# Patient Record
Sex: Female | Born: 1945 | Race: White | Hispanic: No | Smoking: Former smoker
Health system: Southern US, Community
[De-identification: ages and names within clinical notes are randomized; demographics above are authoritative.]

## PROBLEM LIST (undated history)

## (undated) DIAGNOSIS — M1990 Unspecified osteoarthritis, unspecified site: Secondary | ICD-10-CM

## (undated) DIAGNOSIS — E669 Obesity, unspecified: Secondary | ICD-10-CM

## (undated) DIAGNOSIS — I10 Essential (primary) hypertension: Secondary | ICD-10-CM

## (undated) DIAGNOSIS — I2510 Atherosclerotic heart disease of native coronary artery without angina pectoris: Secondary | ICD-10-CM

## (undated) DIAGNOSIS — E119 Type 2 diabetes mellitus without complications: Secondary | ICD-10-CM

## (undated) HISTORY — DX: Unspecified osteoarthritis, unspecified site: M19.90

## (undated) HISTORY — DX: Essential (primary) hypertension: I10

## (undated) HISTORY — PX: TUBAL LIGATION: SHX77

## (undated) HISTORY — DX: Obesity, unspecified: E66.9

## (undated) HISTORY — DX: Atherosclerotic heart disease of native coronary artery without angina pectoris: I25.10

## (undated) HISTORY — DX: Type 2 diabetes mellitus without complications: E11.9

## (undated) HISTORY — PX: REFRACTIVE SURGERY: SHX103

## (undated) HISTORY — PX: CHOLECYSTECTOMY: SHX55

## (undated) HISTORY — PX: CORONARY STENT PLACEMENT: SHX1402

---

## 1999-06-11 ENCOUNTER — Other Ambulatory Visit: Admission: RE | Admit: 1999-06-11 | Discharge: 1999-06-11 | Payer: Self-pay | Admitting: Family Medicine

## 1999-07-30 ENCOUNTER — Ambulatory Visit (HOSPITAL_COMMUNITY): Admission: RE | Admit: 1999-07-30 | Discharge: 1999-07-31 | Payer: Self-pay | Admitting: Ophthalmology

## 1999-07-30 ENCOUNTER — Encounter: Payer: Self-pay | Admitting: Ophthalmology

## 2000-07-23 ENCOUNTER — Other Ambulatory Visit: Admission: RE | Admit: 2000-07-23 | Discharge: 2000-07-23 | Payer: Self-pay | Admitting: Family Medicine

## 2000-08-10 ENCOUNTER — Ambulatory Visit (HOSPITAL_COMMUNITY): Admission: RE | Admit: 2000-08-10 | Discharge: 2000-08-11 | Payer: Self-pay | Admitting: Cardiovascular Disease

## 2001-08-05 ENCOUNTER — Other Ambulatory Visit: Admission: RE | Admit: 2001-08-05 | Discharge: 2001-08-05 | Payer: Self-pay | Admitting: Family Medicine

## 2004-10-30 ENCOUNTER — Ambulatory Visit (HOSPITAL_COMMUNITY): Admission: RE | Admit: 2004-10-30 | Discharge: 2004-10-30 | Payer: Self-pay | Admitting: Family Medicine

## 2006-02-24 ENCOUNTER — Ambulatory Visit (HOSPITAL_COMMUNITY): Admission: RE | Admit: 2006-02-24 | Discharge: 2006-02-24 | Payer: Self-pay | Admitting: Family Medicine

## 2009-04-13 ENCOUNTER — Emergency Department (HOSPITAL_COMMUNITY): Admission: EM | Admit: 2009-04-13 | Discharge: 2009-04-13 | Payer: Self-pay | Admitting: Emergency Medicine

## 2009-05-09 ENCOUNTER — Encounter: Admission: RE | Admit: 2009-05-09 | Discharge: 2009-05-09 | Payer: Self-pay | Admitting: Cardiology

## 2009-05-11 ENCOUNTER — Inpatient Hospital Stay (HOSPITAL_BASED_OUTPATIENT_CLINIC_OR_DEPARTMENT_OTHER): Admission: RE | Admit: 2009-05-11 | Discharge: 2009-05-11 | Payer: Self-pay | Admitting: Cardiology

## 2010-03-31 ENCOUNTER — Encounter: Payer: Self-pay | Admitting: Family Medicine

## 2010-05-29 LAB — DIFFERENTIAL
Basophils Absolute: 0 10*3/uL (ref 0.0–0.1)
Basophils Relative: 1 % (ref 0–1)
Eosinophils Absolute: 0.3 10*3/uL (ref 0.0–0.7)
Eosinophils Relative: 4 % (ref 0–5)
Lymphocytes Relative: 46 % (ref 12–46)
Lymphs Abs: 3.6 10*3/uL (ref 0.7–4.0)
Monocytes Absolute: 0.7 10*3/uL (ref 0.1–1.0)
Monocytes Relative: 9 % (ref 3–12)
Neutro Abs: 3.2 10*3/uL (ref 1.7–7.7)
Neutrophils Relative %: 41 % — ABNORMAL LOW (ref 43–77)

## 2010-05-29 LAB — POCT CARDIAC MARKERS
CKMB, poc: 1.9 ng/mL (ref 1.0–8.0)
Myoglobin, poc: 152 ng/mL (ref 12–200)
Troponin i, poc: <0.05 ng/mL (ref 0.00–0.09)

## 2010-05-29 LAB — CBC
HCT: 37.5 % (ref 36.0–46.0)
Hemoglobin: 12.9 g/dL (ref 12.0–15.0)
MCHC: 34.3 g/dL (ref 30.0–36.0)
MCV: 92.5 fL (ref 78.0–100.0)
Platelets: 296 10*3/uL (ref 150–400)
RBC: 4.06 MIL/uL (ref 3.87–5.11)
RDW: 14.1 % (ref 11.5–15.5)
WBC: 7.8 10*3/uL (ref 4.0–10.5)

## 2010-05-29 LAB — BASIC METABOLIC PANEL
BUN: 27 mg/dL — ABNORMAL HIGH (ref 6–23)
CO2: 25 mEq/L (ref 19–32)
Chloride: 104 mEq/L (ref 96–112)
Creatinine, Ser: 1.74 mg/dL — ABNORMAL HIGH (ref 0.4–1.2)
GFR calc Af Amer: 35 mL/min — ABNORMAL LOW (ref >60)
Potassium: 4.5 mEq/L (ref 3.5–5.1)

## 2010-05-29 LAB — BRAIN NATRIURETIC PEPTIDE: Pro B Natriuretic peptide (BNP): <30 pg/mL (ref 0.0–100.0)

## 2010-05-31 LAB — POCT I-STAT GLUCOSE
Glucose, Bld: 186 mg/dL — ABNORMAL HIGH (ref 70–99)
Operator id: 122531

## 2010-07-26 NOTE — H&P (Signed)
[place]. [facility]  Patient:    Paige Wilkins, Paige Wilkins                 MRN: [id number] Adm. Date:  [id number] Disc. Date: [id number] Attending:  Ivor Messier CC:         ____________             Marcelyn Bruins. Nile Riggs, M.D.                         History and Physical  This was a planned outpatient urgent surgical admission of this 65 year old white female admitted with a rhegmatogenous retinal detachment of the right eye.  HISTORY OF PRESENT ILLNESS:  This patient had previous cataract implant surgery of the right eye by Dr. Jethro Bolus approximately one year ago in July 2000.  The  patient did well until recently when she began to note the onset of circles in he right eye one to two weeks prior to admission.  She then began to note blurring of vision as if she was under water and a progressive shadow across her vision reducing her vision to light perception only.  She was seen by Dr. Nile Riggs who noted a retinal detachment and the patient was referred to my office.  This diagnosis was confirmed and arrangements made for her outpatient admission at this time.  PAST MEDICAL HISTORY:  Patient has non-insulin-dependent diabetes mellitus for approximately one year and hypertension.  She currently is taking various medications including Accupril, hydrochlorothiazide, Prempro, Glucophage, zinc _______ and Centrum.  She has had to take eye drop for intraocular pressure control twice a day.  Patient is felt to be at satisfactory risk for the proposed surgery under general anesthesia by Dr. ____________, her regular physician.  PHYSICAL EXAMINATION:  VITAL SIGNS:  As recorded on admission:  Blood pressure 159/94, temperature 97.1, heart rate 77, respiration 16.  GENERAL:  Patient is a pleasant, well-nourished, well-developed white female in  acute ocular distress.  HEENT:  Visual acuity as noted above.  External ocular and  slit-lamp examination: The eyes are white and clear with a posterior chamber intraocular lens implant.  The right eye posterior capsule is open.  The pupil is dilated in the right eye and left eye from office examination.  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  Small tiny horseshoe tear is noted at the 2-2:30 position.  There is a small retinal hemorrhage at the 6:30 position but no definite retinal hole.  CHEST:  Lungs are clear to percussion and auscultation.  HEART:  Normal sinus rhythm, no cardiomegaly, no murmurs.  ABDOMEN:  Negative.  EXTREMITIES:  Negative.  ADMISSION DIAGNOSES: 1. Rhegmatogenous retinal detachment, single defect right eye. 2. postbox both eyes. 3. ? glaucoma both eyes.  SURGICAL PLAN:  Scleral buckling procedure right eye with possible vitrectomy.  Patient has been given oral discussion and printed information along with her daughter about the possible complications, reoperations and failure to reattach the retina.  Patient was told that she had an extremely guarded prognosis due to the total detachment and tendency toward proliferative vitreoretinopathy. DD:  07/30/99 TD:  07/30/99 Job: [id number]

## 2010-07-26 NOTE — Op Note (Signed)
[place]. [facility]  Patient:    Paige Wilkins, Paige Wilkins                 MRN: [id number] Proc. Date: 07/30/99 Adm. Date:  [id number] Disc. Date: [id number] Attending:  Ivor Messier CC:         ____________             Marcelyn Bruins. Nile Riggs, M.D.                           Operative Report  PREOPERATIVE DIAGNOSIS:  Rhegmatogenous right eye, single defect.  POSTOPERATIVE DIAGNOSIS:  Rhegmatogenous right eye, single defect.  NAME OF OPERATION:  Scleral buckling procedure right eye using solid silicone implants #277 and #240 cryo application, diathermy application and external drainage of subretinal fluid.  Intravitreal and injection of sterile balanced salt solution in anterior chamber x 2.  SURGEON:  Guadelupe Sabin, M.D.  ASSISTANT:  Nurse.  ANESTHESIA:  General.  OPHTHALMOSCOPY:  As previously described.  OPERATIVE PROCEDURE:  After the patient was prepped and draped lid traction sutures were placed in the right upper and lower lids.  The peritomy was performed adjacent to the limbus 360 degrees.  The rectus muscles were isolated and looped with 4-0 silk traction sutures.  The sclera was infected and felt to be in satisfactory condition for the proposed lamellar scleral dissection.  Localization was then carried out with cryo application to the tiny retinal tear located at the 2-2:30 position.  Good cryo reaction could be seen surrounding the tear.  Once again the retinal hemorrhage was noted at the 6:30 position but no definite retinal break  seen.  It was therefore elected to perform lamellar scleral dissection from the 1-4 oclock position, the bed measuring 9 mm in width.  Light diathermy application were applied to the interscleral lamella.  A total of four 4-0 green [place]ilene sutures were used to close the scleral flaps over a trimmed #277 solid silicone  implant with 3-0 plain catgut reinforcements.  A #240 solid silicone  encircling  band was placed about the globe tied with a suture at the 7:30 position. Anchoring sutures were placed at the 8 and 10 oclock position of 5-0 Dacron.  After repeat indirect ophthalmoscopy, it was elected to drain fluid in the bed at the 3:30 position.  Incision was made through the interscleral lamellar, the choroid exposed with light diathermy and then perforated with the pin electrode.  An abundant amount of clear and slightly yellow tinged subretinal fluid drained.  The eye became quite hypotonus and it was necessary to inject into the anterior chamber on two occasions sterile balanced salt solution, a total of 2-1/2 cc were injected. The subretinal fluid continued to drain as the drain site was reopened at the 3:30 position.  Finally, the subretinal fluid stopped draining and the scleral buckle was out.  Indirect ophthalmoscopy revealed settling of the retinal tear on the implant surface.  There was minimal subretinal fluid left, and it was elected to close.  The scleral flaps were secured permanently and the tension of the encircling band stabilized.  Tenons capsule was pulled forward in the four quadrants and tied as a separate layer.  The tension tie was pulled forward and closed with a running 6-0 chromic catgut suture.  Depo, Garamycin and Celestone were injected in the subtenon space interiorly.  A light patch and protector shield were applied along with Maxitrol and Atropine  ointment.  Duration of procedure f anesthesia administration:  1-1/2 hours.  Patient tolerated the procedure well n general.  Left the operating room for the recovery room in good condition. DD:  07/30/99 TD:  07/30/99 Job: [id number]

## 2010-07-26 NOTE — Cardiovascular Report (Signed)
[place]. [facility]  Patient:    Paige Wilkins, Paige Wilkins                   MRN: [id number] Proc. Date: 08/10/00 Attending:  Alvia Grove., M.D. CC:         Cardiac catheter lab  Monica Becton, M.D.   Cardiac Catheterization  PROCEDURE:  Left heart catheterization with coronary angiography.  She had percutaneous transluminal coronary angioplasty and stenting of the right coronary artery.  INDICATIONS:  Paige Wilkins is a 65 year old female who is referred to the office after having a positive stress test.  She is referred for heart catheterization because of her multiple risk factors for coronary artery disease including diabetes mellitus, hypertension, hyperlipidemia, and a positive stress test.  The patient has had intermittent episodes of chest pain for the past several months.  DESCRIPTION OF PROCEDURE:  The right femoral artery was easily cannulated using a modified Seldinger technique.  RESULTS:  Hemodynamic data: 1. Left ventricular pressure was 148/16. 2. Aortic pressure was 148/105.  Angiographic data: 1. The left main coronary artery is relatively large and normal.  There is    a heavy degree of calcification in the left main and left anterior    descending artery.  The LAD has only minor luminal irregularities. 2. Left circumflex artery is a moderate size vessel.  There are minor luminal    irregularities.  There is a 20-30% stenosis in the proximal circumflex    artery. 3. Right coronary artery is a moderate size vessel and is dominant.  There is    a proximal 70% stenosis followed by a mid-95-99% stenosis.  The PDA    and the posterolateral segment artery are normal.  Left ventriculogram:  Left ventriculogram was performed in a RAO position.  It reveals overall normal left ventricular systolic function with an ejection fraction of 65-70%.  Angioplasty:  The patient was given 5200 units of heparin.  This was followed by  a double bolus Integrilin drip.  The right coronary artery was engaged using a 7 [place] Judkins right 4 catheter.  The right coronary artery was wired using a Patriot 014 angioplasty wire.  A 3.5 by 15 mm Quantum Monorail was passed down to the distal stenosis.  It was inflated up to 12 atmospheres before the plaque actually broke.  It was inflated up to 66.  This resulted in a markedly improved vessel lumen.  There was still a slight amount of haziness.  The balloon was then pulled back and was inflated in the proximal segment on two occasions.  Then 6 atmospheres for 45 seconds followed by 12 atmospheres for 45 seconds.  This resulted in some continued improvement.  At this point, it was decided to stent the distal most lesion.  A 3.5 by 15 mm Nier stent was positioned across the distal stenosis and was deployed at 12 atmospheres for 43 seconds.  This resulted in a nice angiographic result with a 0% residual.  The stent balloon was then pulled back to the more proximal stenosis.  It was inflated up to 12 atmospheres for 45 seconds.  This resulted in some continued improvement of the vessel lumen but with a dissection.  At this point, a 4.0 by 15 mm Nier Elite stent was positioned across the stenosis.  There was a moderately large size discrepancy between the proximal edge of the vessel and the more distal edge of this same segment.  The 4.0 mm stent  was deployed at 8 atmospheres to match the distal edge of the lesion. The balloon was then let down and then was reinflated in a more proximal segment of stent up to 12 atmospheres.  This resulted in a nice flare with a final proximal diameter of approximately 4.3 mm with a distal diameter of approximately 3.9 to 4.0 mm.  This resulted in a nice angiographic lumen. There is no evidence of edge dissection.  There is a nice step up and step down on either side of the stent.  COMPLICATIONS:  None.  CONCLUSIONS: 1. Successful percutaneous  transluminal coronary angioplasty and stenting    of the proximal right coronary artery and the mid-right coronary artery. 2. Overall normal left ventricular systolic function. 3. The patient has extensive calcification in the left main and left    anterior descending artery.  She does not have any stenosis that need    attention, although she is at some risk.  We will continue to modify her    risk factors.DD:  08/10/00 TD:  08/10/00 Job: [id number]

## 2011-04-08 ENCOUNTER — Other Ambulatory Visit (HOSPITAL_COMMUNITY): Payer: Self-pay | Admitting: Family Medicine

## 2011-04-08 DIAGNOSIS — Z139 Encounter for screening, unspecified: Secondary | ICD-10-CM

## 2011-04-14 ENCOUNTER — Ambulatory Visit (HOSPITAL_COMMUNITY)
Admission: RE | Admit: 2011-04-14 | Discharge: 2011-04-14 | Disposition: A | Discharge status: Home / Self Care | Payer: BC Managed Care – PPO | Source: Ambulatory Visit | Attending: Family Medicine | Admitting: Family Medicine

## 2011-04-14 DIAGNOSIS — Z139 Encounter for screening, unspecified: Secondary | ICD-10-CM

## 2011-04-14 DIAGNOSIS — Z1231 Encounter for screening mammogram for malignant neoplasm of breast: Secondary | ICD-10-CM | POA: Insufficient documentation

## 2011-07-22 ENCOUNTER — Encounter: Payer: Self-pay | Admitting: *Deleted

## 2011-11-24 ENCOUNTER — Encounter: Payer: Self-pay | Admitting: Cardiology

## 2012-05-17 ENCOUNTER — Other Ambulatory Visit (HOSPITAL_COMMUNITY): Payer: Self-pay | Admitting: Family Medicine

## 2012-05-20 ENCOUNTER — Ambulatory Visit (HOSPITAL_COMMUNITY)
Admission: RE | Admit: 2012-05-20 | Discharge: 2012-05-20 | Disposition: A | Discharge status: Home / Self Care | Payer: Medicare Other | Source: Ambulatory Visit | Attending: Family Medicine | Admitting: Family Medicine

## 2012-05-20 DIAGNOSIS — Z1231 Encounter for screening mammogram for malignant neoplasm of breast: Secondary | ICD-10-CM | POA: Insufficient documentation

## 2013-06-20 ENCOUNTER — Other Ambulatory Visit (HOSPITAL_COMMUNITY): Payer: Self-pay | Admitting: Family Medicine

## 2013-06-20 DIAGNOSIS — Z139 Encounter for screening, unspecified: Secondary | ICD-10-CM

## 2013-06-23 ENCOUNTER — Ambulatory Visit (HOSPITAL_COMMUNITY)
Admission: RE | Admit: 2013-06-23 | Discharge: 2013-06-23 | Disposition: A | Discharge status: Home / Self Care | Payer: Medicare Other | Source: Ambulatory Visit | Attending: Family Medicine | Admitting: Family Medicine

## 2013-06-23 DIAGNOSIS — Z1231 Encounter for screening mammogram for malignant neoplasm of breast: Secondary | ICD-10-CM | POA: Insufficient documentation

## 2013-06-23 DIAGNOSIS — Z139 Encounter for screening, unspecified: Secondary | ICD-10-CM

## 2014-06-23 ENCOUNTER — Other Ambulatory Visit (HOSPITAL_COMMUNITY): Payer: Self-pay | Admitting: Family Medicine

## 2014-06-23 DIAGNOSIS — Z1231 Encounter for screening mammogram for malignant neoplasm of breast: Secondary | ICD-10-CM

## 2014-06-29 ENCOUNTER — Ambulatory Visit (HOSPITAL_COMMUNITY)
Admission: RE | Admit: 2014-06-29 | Discharge: 2014-06-29 | Disposition: A | Discharge status: Home / Self Care | Payer: Medicare Other | Source: Ambulatory Visit | Attending: Family Medicine | Admitting: Family Medicine

## 2014-06-29 DIAGNOSIS — Z1231 Encounter for screening mammogram for malignant neoplasm of breast: Secondary | ICD-10-CM | POA: Insufficient documentation

## 2014-07-04 ENCOUNTER — Other Ambulatory Visit: Payer: Self-pay | Admitting: Family Medicine

## 2014-07-04 DIAGNOSIS — R928 Other abnormal and inconclusive findings on diagnostic imaging of breast: Secondary | ICD-10-CM

## 2014-07-10 ENCOUNTER — Ambulatory Visit
Admission: RE | Admit: 2014-07-10 | Discharge: 2014-07-10 | Disposition: A | Discharge status: Home / Self Care | Payer: Medicare Other | Source: Ambulatory Visit | Attending: Family Medicine | Admitting: Family Medicine

## 2014-07-10 DIAGNOSIS — R928 Other abnormal and inconclusive findings on diagnostic imaging of breast: Secondary | ICD-10-CM

## 2021-04-17 DIAGNOSIS — R413 Other amnesia: Secondary | ICD-10-CM | POA: Diagnosis not present

## 2021-04-17 DIAGNOSIS — E119 Type 2 diabetes mellitus without complications: Secondary | ICD-10-CM | POA: Diagnosis not present

## 2021-04-17 DIAGNOSIS — I10 Essential (primary) hypertension: Secondary | ICD-10-CM | POA: Diagnosis not present

## 2021-04-17 DIAGNOSIS — N184 Chronic kidney disease, stage 4 (severe): Secondary | ICD-10-CM | POA: Diagnosis not present

## 2021-04-18 DIAGNOSIS — N184 Chronic kidney disease, stage 4 (severe): Secondary | ICD-10-CM | POA: Diagnosis not present

## 2021-04-18 DIAGNOSIS — E119 Type 2 diabetes mellitus without complications: Secondary | ICD-10-CM | POA: Diagnosis not present

## 2021-04-18 DIAGNOSIS — I10 Essential (primary) hypertension: Secondary | ICD-10-CM | POA: Diagnosis not present

## 2021-04-22 DIAGNOSIS — D631 Anemia in chronic kidney disease: Secondary | ICD-10-CM | POA: Diagnosis not present

## 2021-04-22 DIAGNOSIS — H409 Unspecified glaucoma: Secondary | ICD-10-CM | POA: Diagnosis not present

## 2021-04-22 DIAGNOSIS — W19XXXS Unspecified fall, sequela: Secondary | ICD-10-CM | POA: Diagnosis not present

## 2021-04-22 DIAGNOSIS — N184 Chronic kidney disease, stage 4 (severe): Secondary | ICD-10-CM | POA: Diagnosis not present

## 2021-04-22 DIAGNOSIS — E1122 Type 2 diabetes mellitus with diabetic chronic kidney disease: Secondary | ICD-10-CM | POA: Diagnosis not present

## 2021-04-22 DIAGNOSIS — E785 Hyperlipidemia, unspecified: Secondary | ICD-10-CM | POA: Diagnosis not present

## 2021-04-22 DIAGNOSIS — M1990 Unspecified osteoarthritis, unspecified site: Secondary | ICD-10-CM | POA: Diagnosis not present

## 2021-04-22 DIAGNOSIS — R609 Edema, unspecified: Secondary | ICD-10-CM | POA: Diagnosis not present

## 2021-04-22 DIAGNOSIS — R531 Weakness: Secondary | ICD-10-CM | POA: Diagnosis not present

## 2021-04-22 DIAGNOSIS — I129 Hypertensive chronic kidney disease with stage 1 through stage 4 chronic kidney disease, or unspecified chronic kidney disease: Secondary | ICD-10-CM | POA: Diagnosis not present

## 2021-04-24 DIAGNOSIS — E1122 Type 2 diabetes mellitus with diabetic chronic kidney disease: Secondary | ICD-10-CM | POA: Diagnosis not present

## 2021-04-24 DIAGNOSIS — R609 Edema, unspecified: Secondary | ICD-10-CM | POA: Diagnosis not present

## 2021-04-24 DIAGNOSIS — I129 Hypertensive chronic kidney disease with stage 1 through stage 4 chronic kidney disease, or unspecified chronic kidney disease: Secondary | ICD-10-CM | POA: Diagnosis not present

## 2021-04-24 DIAGNOSIS — H409 Unspecified glaucoma: Secondary | ICD-10-CM | POA: Diagnosis not present

## 2021-04-24 DIAGNOSIS — M1990 Unspecified osteoarthritis, unspecified site: Secondary | ICD-10-CM | POA: Diagnosis not present

## 2021-04-24 DIAGNOSIS — D631 Anemia in chronic kidney disease: Secondary | ICD-10-CM | POA: Diagnosis not present

## 2021-04-24 DIAGNOSIS — R531 Weakness: Secondary | ICD-10-CM | POA: Diagnosis not present

## 2021-04-24 DIAGNOSIS — W19XXXS Unspecified fall, sequela: Secondary | ICD-10-CM | POA: Diagnosis not present

## 2021-04-24 DIAGNOSIS — N184 Chronic kidney disease, stage 4 (severe): Secondary | ICD-10-CM | POA: Diagnosis not present

## 2021-04-24 DIAGNOSIS — E785 Hyperlipidemia, unspecified: Secondary | ICD-10-CM | POA: Diagnosis not present

## 2021-04-25 DIAGNOSIS — D631 Anemia in chronic kidney disease: Secondary | ICD-10-CM | POA: Diagnosis not present

## 2021-04-25 DIAGNOSIS — H409 Unspecified glaucoma: Secondary | ICD-10-CM | POA: Diagnosis not present

## 2021-04-25 DIAGNOSIS — W19XXXS Unspecified fall, sequela: Secondary | ICD-10-CM | POA: Diagnosis not present

## 2021-04-25 DIAGNOSIS — I129 Hypertensive chronic kidney disease with stage 1 through stage 4 chronic kidney disease, or unspecified chronic kidney disease: Secondary | ICD-10-CM | POA: Diagnosis not present

## 2021-04-25 DIAGNOSIS — R531 Weakness: Secondary | ICD-10-CM | POA: Diagnosis not present

## 2021-04-25 DIAGNOSIS — E785 Hyperlipidemia, unspecified: Secondary | ICD-10-CM | POA: Diagnosis not present

## 2021-04-25 DIAGNOSIS — R609 Edema, unspecified: Secondary | ICD-10-CM | POA: Diagnosis not present

## 2021-04-25 DIAGNOSIS — N184 Chronic kidney disease, stage 4 (severe): Secondary | ICD-10-CM | POA: Diagnosis not present

## 2021-04-25 DIAGNOSIS — M1990 Unspecified osteoarthritis, unspecified site: Secondary | ICD-10-CM | POA: Diagnosis not present

## 2021-04-25 DIAGNOSIS — E1122 Type 2 diabetes mellitus with diabetic chronic kidney disease: Secondary | ICD-10-CM | POA: Diagnosis not present

## 2021-04-29 DIAGNOSIS — W19XXXS Unspecified fall, sequela: Secondary | ICD-10-CM | POA: Diagnosis not present

## 2021-04-29 DIAGNOSIS — R531 Weakness: Secondary | ICD-10-CM | POA: Diagnosis not present

## 2021-04-29 DIAGNOSIS — N184 Chronic kidney disease, stage 4 (severe): Secondary | ICD-10-CM | POA: Diagnosis not present

## 2021-04-29 DIAGNOSIS — E1122 Type 2 diabetes mellitus with diabetic chronic kidney disease: Secondary | ICD-10-CM | POA: Diagnosis not present

## 2021-04-29 DIAGNOSIS — R609 Edema, unspecified: Secondary | ICD-10-CM | POA: Diagnosis not present

## 2021-04-29 DIAGNOSIS — I129 Hypertensive chronic kidney disease with stage 1 through stage 4 chronic kidney disease, or unspecified chronic kidney disease: Secondary | ICD-10-CM | POA: Diagnosis not present

## 2021-04-29 DIAGNOSIS — E785 Hyperlipidemia, unspecified: Secondary | ICD-10-CM | POA: Diagnosis not present

## 2021-04-29 DIAGNOSIS — D631 Anemia in chronic kidney disease: Secondary | ICD-10-CM | POA: Diagnosis not present

## 2021-04-29 DIAGNOSIS — H409 Unspecified glaucoma: Secondary | ICD-10-CM | POA: Diagnosis not present

## 2021-04-29 DIAGNOSIS — M1990 Unspecified osteoarthritis, unspecified site: Secondary | ICD-10-CM | POA: Diagnosis not present

## 2021-05-01 DIAGNOSIS — M1990 Unspecified osteoarthritis, unspecified site: Secondary | ICD-10-CM | POA: Diagnosis not present

## 2021-05-01 DIAGNOSIS — R531 Weakness: Secondary | ICD-10-CM | POA: Diagnosis not present

## 2021-05-01 DIAGNOSIS — R609 Edema, unspecified: Secondary | ICD-10-CM | POA: Diagnosis not present

## 2021-05-01 DIAGNOSIS — D631 Anemia in chronic kidney disease: Secondary | ICD-10-CM | POA: Diagnosis not present

## 2021-05-01 DIAGNOSIS — W19XXXS Unspecified fall, sequela: Secondary | ICD-10-CM | POA: Diagnosis not present

## 2021-05-01 DIAGNOSIS — I129 Hypertensive chronic kidney disease with stage 1 through stage 4 chronic kidney disease, or unspecified chronic kidney disease: Secondary | ICD-10-CM | POA: Diagnosis not present

## 2021-05-01 DIAGNOSIS — N184 Chronic kidney disease, stage 4 (severe): Secondary | ICD-10-CM | POA: Diagnosis not present

## 2021-05-01 DIAGNOSIS — E785 Hyperlipidemia, unspecified: Secondary | ICD-10-CM | POA: Diagnosis not present

## 2021-05-01 DIAGNOSIS — H409 Unspecified glaucoma: Secondary | ICD-10-CM | POA: Diagnosis not present

## 2021-05-01 DIAGNOSIS — E1122 Type 2 diabetes mellitus with diabetic chronic kidney disease: Secondary | ICD-10-CM | POA: Diagnosis not present

## 2021-05-02 DIAGNOSIS — Z9181 History of falling: Secondary | ICD-10-CM | POA: Diagnosis not present

## 2021-05-02 DIAGNOSIS — W19XXXA Unspecified fall, initial encounter: Secondary | ICD-10-CM | POA: Diagnosis not present

## 2021-05-02 DIAGNOSIS — I959 Hypotension, unspecified: Secondary | ICD-10-CM | POA: Diagnosis not present

## 2021-05-02 DIAGNOSIS — M6281 Muscle weakness (generalized): Secondary | ICD-10-CM | POA: Diagnosis not present

## 2021-05-02 DIAGNOSIS — S0003XA Contusion of scalp, initial encounter: Secondary | ICD-10-CM | POA: Diagnosis not present

## 2021-05-02 DIAGNOSIS — F0393 Unspecified dementia, unspecified severity, with mood disturbance: Secondary | ICD-10-CM | POA: Diagnosis not present

## 2021-05-02 DIAGNOSIS — E870 Hyperosmolality and hypernatremia: Secondary | ICD-10-CM | POA: Diagnosis not present

## 2021-05-02 DIAGNOSIS — G2581 Restless legs syndrome: Secondary | ICD-10-CM | POA: Diagnosis not present

## 2021-05-02 DIAGNOSIS — I2510 Atherosclerotic heart disease of native coronary artery without angina pectoris: Secondary | ICD-10-CM | POA: Diagnosis not present

## 2021-05-02 DIAGNOSIS — G9341 Metabolic encephalopathy: Secondary | ICD-10-CM | POA: Diagnosis not present

## 2021-05-02 DIAGNOSIS — U071 COVID-19: Secondary | ICD-10-CM | POA: Diagnosis not present

## 2021-05-02 DIAGNOSIS — D631 Anemia in chronic kidney disease: Secondary | ICD-10-CM | POA: Diagnosis not present

## 2021-05-02 DIAGNOSIS — R509 Fever, unspecified: Secondary | ICD-10-CM | POA: Diagnosis not present

## 2021-05-02 DIAGNOSIS — E7800 Pure hypercholesterolemia, unspecified: Secondary | ICD-10-CM | POA: Diagnosis not present

## 2021-05-02 DIAGNOSIS — E1122 Type 2 diabetes mellitus with diabetic chronic kidney disease: Secondary | ICD-10-CM | POA: Diagnosis not present

## 2021-05-02 DIAGNOSIS — D638 Anemia in other chronic diseases classified elsewhere: Secondary | ICD-10-CM | POA: Diagnosis not present

## 2021-05-02 DIAGNOSIS — R609 Edema, unspecified: Secondary | ICD-10-CM | POA: Diagnosis not present

## 2021-05-02 DIAGNOSIS — F0390 Unspecified dementia without behavioral disturbance: Secondary | ICD-10-CM | POA: Diagnosis not present

## 2021-05-02 DIAGNOSIS — F419 Anxiety disorder, unspecified: Secondary | ICD-10-CM | POA: Diagnosis not present

## 2021-05-02 DIAGNOSIS — N3941 Urge incontinence: Secondary | ICD-10-CM | POA: Diagnosis not present

## 2021-05-02 DIAGNOSIS — M1990 Unspecified osteoarthritis, unspecified site: Secondary | ICD-10-CM | POA: Diagnosis not present

## 2021-05-02 DIAGNOSIS — R42 Dizziness and giddiness: Secondary | ICD-10-CM | POA: Diagnosis not present

## 2021-05-02 DIAGNOSIS — M1380 Other specified arthritis, unspecified site: Secondary | ICD-10-CM | POA: Diagnosis not present

## 2021-05-02 DIAGNOSIS — R2689 Other abnormalities of gait and mobility: Secondary | ICD-10-CM | POA: Diagnosis not present

## 2021-05-02 DIAGNOSIS — W19XXXS Unspecified fall, sequela: Secondary | ICD-10-CM | POA: Diagnosis not present

## 2021-05-02 DIAGNOSIS — R2681 Unsteadiness on feet: Secondary | ICD-10-CM | POA: Diagnosis not present

## 2021-05-02 DIAGNOSIS — I10 Essential (primary) hypertension: Secondary | ICD-10-CM | POA: Diagnosis not present

## 2021-05-02 DIAGNOSIS — R4182 Altered mental status, unspecified: Secondary | ICD-10-CM | POA: Diagnosis not present

## 2021-05-02 DIAGNOSIS — F03918 Unspecified dementia, unspecified severity, with other behavioral disturbance: Secondary | ICD-10-CM | POA: Diagnosis not present

## 2021-05-02 DIAGNOSIS — G4700 Insomnia, unspecified: Secondary | ICD-10-CM | POA: Diagnosis not present

## 2021-05-02 DIAGNOSIS — R279 Unspecified lack of coordination: Secondary | ICD-10-CM | POA: Diagnosis not present

## 2021-05-02 DIAGNOSIS — Z8616 Personal history of COVID-19: Secondary | ICD-10-CM | POA: Diagnosis not present

## 2021-05-02 DIAGNOSIS — H409 Unspecified glaucoma: Secondary | ICD-10-CM | POA: Diagnosis not present

## 2021-05-02 DIAGNOSIS — E785 Hyperlipidemia, unspecified: Secondary | ICD-10-CM | POA: Diagnosis not present

## 2021-05-02 DIAGNOSIS — D509 Iron deficiency anemia, unspecified: Secondary | ICD-10-CM | POA: Diagnosis not present

## 2021-05-02 DIAGNOSIS — E43 Unspecified severe protein-calorie malnutrition: Secondary | ICD-10-CM | POA: Diagnosis not present

## 2021-05-02 DIAGNOSIS — N184 Chronic kidney disease, stage 4 (severe): Secondary | ICD-10-CM | POA: Diagnosis not present

## 2021-05-02 DIAGNOSIS — N179 Acute kidney failure, unspecified: Secondary | ICD-10-CM | POA: Diagnosis not present

## 2021-05-02 DIAGNOSIS — Z743 Need for continuous supervision: Secondary | ICD-10-CM | POA: Diagnosis not present

## 2021-05-02 DIAGNOSIS — R531 Weakness: Secondary | ICD-10-CM | POA: Diagnosis not present

## 2021-05-02 DIAGNOSIS — E860 Dehydration: Secondary | ICD-10-CM | POA: Diagnosis not present

## 2021-05-02 DIAGNOSIS — I129 Hypertensive chronic kidney disease with stage 1 through stage 4 chronic kidney disease, or unspecified chronic kidney disease: Secondary | ICD-10-CM | POA: Diagnosis not present

## 2021-05-22 DIAGNOSIS — N3941 Urge incontinence: Secondary | ICD-10-CM | POA: Diagnosis not present

## 2021-05-22 DIAGNOSIS — R2689 Other abnormalities of gait and mobility: Secondary | ICD-10-CM | POA: Diagnosis not present

## 2021-05-22 DIAGNOSIS — R279 Unspecified lack of coordination: Secondary | ICD-10-CM | POA: Diagnosis not present

## 2021-05-22 DIAGNOSIS — G9341 Metabolic encephalopathy: Secondary | ICD-10-CM | POA: Diagnosis not present

## 2021-05-22 DIAGNOSIS — G4700 Insomnia, unspecified: Secondary | ICD-10-CM | POA: Diagnosis not present

## 2021-05-22 DIAGNOSIS — W19XXXA Unspecified fall, initial encounter: Secondary | ICD-10-CM | POA: Diagnosis not present

## 2021-05-22 DIAGNOSIS — R5381 Other malaise: Secondary | ICD-10-CM | POA: Diagnosis not present

## 2021-05-22 DIAGNOSIS — N179 Acute kidney failure, unspecified: Secondary | ICD-10-CM | POA: Diagnosis not present

## 2021-05-22 DIAGNOSIS — I10 Essential (primary) hypertension: Secondary | ICD-10-CM | POA: Diagnosis not present

## 2021-05-22 DIAGNOSIS — R2681 Unsteadiness on feet: Secondary | ICD-10-CM | POA: Diagnosis not present

## 2021-05-22 DIAGNOSIS — F0390 Unspecified dementia without behavioral disturbance: Secondary | ICD-10-CM | POA: Diagnosis not present

## 2021-05-22 DIAGNOSIS — M6281 Muscle weakness (generalized): Secondary | ICD-10-CM | POA: Diagnosis not present

## 2021-05-22 DIAGNOSIS — N184 Chronic kidney disease, stage 4 (severe): Secondary | ICD-10-CM | POA: Diagnosis not present

## 2021-05-22 DIAGNOSIS — U071 COVID-19: Secondary | ICD-10-CM | POA: Diagnosis not present

## 2021-05-22 DIAGNOSIS — G2581 Restless legs syndrome: Secondary | ICD-10-CM | POA: Diagnosis not present

## 2021-05-22 DIAGNOSIS — R531 Weakness: Secondary | ICD-10-CM | POA: Diagnosis not present

## 2021-05-22 DIAGNOSIS — F419 Anxiety disorder, unspecified: Secondary | ICD-10-CM | POA: Diagnosis not present

## 2021-05-22 DIAGNOSIS — Z8616 Personal history of COVID-19: Secondary | ICD-10-CM | POA: Diagnosis not present

## 2021-05-22 DIAGNOSIS — Z743 Need for continuous supervision: Secondary | ICD-10-CM | POA: Diagnosis not present

## 2021-05-22 DIAGNOSIS — M1380 Other specified arthritis, unspecified site: Secondary | ICD-10-CM | POA: Diagnosis not present

## 2021-05-22 DIAGNOSIS — F338 Other recurrent depressive disorders: Secondary | ICD-10-CM | POA: Diagnosis not present

## 2021-05-22 DIAGNOSIS — R296 Repeated falls: Secondary | ICD-10-CM | POA: Diagnosis not present

## 2021-05-22 DIAGNOSIS — E43 Unspecified severe protein-calorie malnutrition: Secondary | ICD-10-CM | POA: Diagnosis not present

## 2021-05-22 DIAGNOSIS — Z9181 History of falling: Secondary | ICD-10-CM | POA: Diagnosis not present

## 2021-05-23 DIAGNOSIS — I10 Essential (primary) hypertension: Secondary | ICD-10-CM | POA: Diagnosis not present

## 2021-05-23 DIAGNOSIS — F0390 Unspecified dementia without behavioral disturbance: Secondary | ICD-10-CM | POA: Diagnosis not present

## 2021-05-23 DIAGNOSIS — R296 Repeated falls: Secondary | ICD-10-CM | POA: Diagnosis not present

## 2021-05-23 DIAGNOSIS — R5381 Other malaise: Secondary | ICD-10-CM | POA: Diagnosis not present

## 2021-05-27 DIAGNOSIS — I10 Essential (primary) hypertension: Secondary | ICD-10-CM | POA: Diagnosis not present

## 2021-05-27 DIAGNOSIS — R5381 Other malaise: Secondary | ICD-10-CM | POA: Diagnosis not present

## 2021-05-27 DIAGNOSIS — R296 Repeated falls: Secondary | ICD-10-CM | POA: Diagnosis not present

## 2021-06-03 DIAGNOSIS — N184 Chronic kidney disease, stage 4 (severe): Secondary | ICD-10-CM | POA: Diagnosis not present

## 2021-06-03 DIAGNOSIS — F0390 Unspecified dementia without behavioral disturbance: Secondary | ICD-10-CM | POA: Diagnosis not present

## 2021-06-03 DIAGNOSIS — R5381 Other malaise: Secondary | ICD-10-CM | POA: Diagnosis not present

## 2021-06-03 DIAGNOSIS — I10 Essential (primary) hypertension: Secondary | ICD-10-CM | POA: Diagnosis not present

## 2021-06-04 ENCOUNTER — Non-Acute Institutional Stay (SKILLED_NURSING_FACILITY): Payer: Self-pay | Admitting: Adult Health

## 2021-06-04 ENCOUNTER — Encounter (HOSPITAL_COMMUNITY)
Admission: RE | Admit: 2021-06-04 | Discharge: 2021-06-04 | Disposition: A | Discharge status: Home / Self Care | Payer: Medicare Other | Source: Skilled Nursing Facility | Attending: Internal Medicine | Admitting: Internal Medicine

## 2021-06-04 ENCOUNTER — Encounter: Payer: Self-pay | Admitting: Adult Health

## 2021-06-04 ENCOUNTER — Encounter: Payer: Self-pay | Admitting: Internal Medicine

## 2021-06-04 DIAGNOSIS — Z741 Need for assistance with personal care: Secondary | ICD-10-CM | POA: Diagnosis not present

## 2021-06-04 DIAGNOSIS — N183 Chronic kidney disease, stage 3 unspecified: Secondary | ICD-10-CM | POA: Insufficient documentation

## 2021-06-04 DIAGNOSIS — D649 Anemia, unspecified: Secondary | ICD-10-CM | POA: Insufficient documentation

## 2021-06-04 DIAGNOSIS — G9341 Metabolic encephalopathy: Secondary | ICD-10-CM | POA: Insufficient documentation

## 2021-06-04 DIAGNOSIS — E785 Hyperlipidemia, unspecified: Secondary | ICD-10-CM | POA: Insufficient documentation

## 2021-06-04 DIAGNOSIS — I700 Atherosclerosis of aorta: Secondary | ICD-10-CM | POA: Insufficient documentation

## 2021-06-04 DIAGNOSIS — D631 Anemia in chronic kidney disease: Secondary | ICD-10-CM

## 2021-06-04 DIAGNOSIS — E1122 Type 2 diabetes mellitus with diabetic chronic kidney disease: Secondary | ICD-10-CM

## 2021-06-04 DIAGNOSIS — R2681 Unsteadiness on feet: Secondary | ICD-10-CM | POA: Diagnosis not present

## 2021-06-04 DIAGNOSIS — E1169 Type 2 diabetes mellitus with other specified complication: Secondary | ICD-10-CM

## 2021-06-04 DIAGNOSIS — I10 Essential (primary) hypertension: Secondary | ICD-10-CM | POA: Insufficient documentation

## 2021-06-04 DIAGNOSIS — E43 Unspecified severe protein-calorie malnutrition: Secondary | ICD-10-CM | POA: Diagnosis not present

## 2021-06-04 DIAGNOSIS — Z9181 History of falling: Secondary | ICD-10-CM | POA: Diagnosis not present

## 2021-06-04 DIAGNOSIS — E46 Unspecified protein-calorie malnutrition: Secondary | ICD-10-CM | POA: Insufficient documentation

## 2021-06-04 DIAGNOSIS — E7849 Other hyperlipidemia: Secondary | ICD-10-CM | POA: Diagnosis not present

## 2021-06-04 DIAGNOSIS — N1832 Chronic kidney disease, stage 3b: Secondary | ICD-10-CM

## 2021-06-04 DIAGNOSIS — F0390 Unspecified dementia without behavioral disturbance: Secondary | ICD-10-CM

## 2021-06-04 DIAGNOSIS — R41841 Cognitive communication deficit: Secondary | ICD-10-CM | POA: Diagnosis not present

## 2021-06-04 DIAGNOSIS — R262 Difficulty in walking, not elsewhere classified: Secondary | ICD-10-CM | POA: Diagnosis not present

## 2021-06-04 DIAGNOSIS — I129 Hypertensive chronic kidney disease with stage 1 through stage 4 chronic kidney disease, or unspecified chronic kidney disease: Secondary | ICD-10-CM

## 2021-06-04 DIAGNOSIS — N184 Chronic kidney disease, stage 4 (severe): Secondary | ICD-10-CM | POA: Diagnosis not present

## 2021-06-04 DIAGNOSIS — H40053 Ocular hypertension, bilateral: Secondary | ICD-10-CM

## 2021-06-04 LAB — COMPREHENSIVE METABOLIC PANEL
ALT: 23 U/L (ref 0–44)
AST: 28 U/L (ref 15–41)
Albumin: 3.3 g/dL — ABNORMAL LOW (ref 3.5–5.0)
Alkaline Phosphatase: 45 U/L (ref 38–126)
Anion gap: 5 (ref 5–15)
BUN: 35 mg/dL — ABNORMAL HIGH (ref 8–23)
CO2: 21 mmol/L — ABNORMAL LOW (ref 22–32)
Calcium: 8.4 mg/dL — ABNORMAL LOW (ref 8.9–10.3)
Chloride: 109 mmol/L (ref 98–111)
Creatinine, Ser: 1.6 mg/dL — ABNORMAL HIGH (ref 0.44–1.00)
GFR, Estimated: 33 mL/min — ABNORMAL LOW (ref >60)
Glucose, Bld: 95 mg/dL (ref 70–99)
Potassium: 4.1 mmol/L (ref 3.5–5.1)
Sodium: 135 mmol/L (ref 135–145)
Total Bilirubin: 0.4 mg/dL (ref 0.3–1.2)
Total Protein: 5.6 g/dL — ABNORMAL LOW (ref 6.5–8.1)

## 2021-06-04 LAB — LIPID PANEL
Cholesterol: 107 mg/dL (ref 0–200)
HDL: 51 mg/dL (ref >40)
LDL Cholesterol: 50 mg/dL (ref 0–99)
Total CHOL/HDL Ratio: 2.1 RATIO
Triglycerides: 32 mg/dL (ref <150)
VLDL: 6 mg/dL (ref 0–40)

## 2021-06-04 NOTE — Progress Notes (Signed)
?Location:  [place] ?Nursing Home Room Number: [id number] ?Place of Service:  SNF (31) ?Provider:  Ok Edwards, NP ? ?CODE STATUS: dnr  ? ?No Known Allergies ? ?Chief Complaint  ?Patient presents with  ? Hospitalization Follow-up  ? ? ?HPI: ? ?She is being transferred to this facility from another location. She is voicing no complaints. She denies any pain states that she is feeling good. She denies any changes in appetite. No insomnia no anxiety. This does represent a long term placement for her. She will continue to be followed for her chronic illnesses including Controlled type 2 diabetes mellitus with stage 3 chronic kidney disease without long term use of insulin: Stage 3 chronic kidney disease due to type 2 diabetes mellitus: Hyperlipidemia associated with type 2 diabetes mellitus: Anemia due to stage 3b chronic kidney disease ? ?Past Medical History:  ?Diagnosis Date  ? CAD (coronary artery disease)   ? DM (diabetes mellitus) ([place])   ? Glaucoma   ? HTN (hypertension)   ? Obesity   ? Osteoarthritis   ? ? ?Past Surgical History:  ?Procedure Laterality Date  ? CHOLECYSTECTOMY    ? CORONARY STENT PLACEMENT    ? x3  ? REFRACTIVE SURGERY    ? detached retina  ? TUBAL LIGATION    ? 76 yrs old  ? ? ?Social History  ? ?Socioeconomic History  ? Marital status: Divorced  ?  Spouse name: Not on file  ? Number of children: 2  ? Years of education: Not on file  ? Highest education level: Not on file  ?Occupational History  ? Occupation: retired  ?Tobacco Use  ? Smoking status: Every Day  ?  Packs/day: 1.00  ?  Types: Cigarettes  ? Smokeless tobacco: Not on file  ?Substance and Sexual Activity  ? Alcohol use: Yes  ?  Comment: rarely  ? Drug use: Not on file  ? Sexual activity: Not on file  ?Other Topics Concern  ? Not on file  ?Social History Narrative  ? Not on file  ? ?Social Determinants of Health  ? ?Financial Resource Strain: Not on file  ?Food Insecurity: Not on file  ?Transportation Needs: Not on file   ?Physical Activity: Not on file  ?Stress: Not on file  ?Social Connections: Not on file  ?Intimate Partner Violence: Not on file  ? ?Family History  ?Problem Relation Age of Onset  ? Arthritis Other   ? Heart disease Other   ? Hypertension Other   ? Stroke Other   ? Kidney disease Other   ? Diabetes Other   ? ? ? ? ?VITAL SIGNS ?BP (!) 154/44   Pulse (!) 57   Temp (!) 97.1 ?F (36.2 ?C)   Resp (!) 24   Wt 130 lb 6.4 oz (59.1 kg)   SpO2 95%  ? ?Outpatient Encounter Medications as of 06/04/2021  ?Medication Sig  ? aspirin 81 MG tablet Take 81 mg by mouth daily.  ? atorvastatin (LIPITOR) 20 MG tablet Take 20 mg by mouth daily.  ? dorzolamide-timolol (COSOPT) 22.3-6.8 MG/ML ophthalmic solution Place 1 drop into both eyes daily. 1 drop both eyes; ophthalmic.   Wait at least 5 minutes between multiple drops in same eye.  ? ferrous sulfate 324 MG TBEC Take 324 mg by mouth daily.  ? latanoprost (XALATAN) 0.005 % ophthalmic solution Place 1 drop into both eyes daily.  ? mirtazapine (REMERON) 15 MG tablet Take 15 mg by mouth daily.  ? ?No facility-administered  encounter medications on file as of 06/04/2021.  ? ? ? ?SIGNIFICANT DIAGNOSTIC EXAMS ? ?TODAY ? ?05-21-21: hgb a1c 6.1; tsh 1.689; uric acid: 7.2  ?06-04-21: glucose 95; bun 35; creat 1.40; k+ 4.1; na++ 135; ca 8.4 protein 5.6; albumin 3.3 chol 107; ldl 50; trig 32; hdl 51 ? ?Review of Systems  ?Constitutional:  Negative for malaise/fatigue.  ?Respiratory:  Negative for cough and shortness of breath.   ?Cardiovascular:  Negative for chest pain, palpitations and leg swelling.  ?Gastrointestinal:  Negative for abdominal pain, constipation and heartburn.  ?Musculoskeletal:  Negative for back pain, joint pain and myalgias.  ?Skin: Negative.   ?Neurological:  Negative for dizziness.  ?Psychiatric/Behavioral:  The patient is not nervous/anxious.   ? ?Physical Exam ?Constitutional:   ?   General: She is not in acute distress. ?   Appearance: She is well-developed. She is not  diaphoretic.  ?Neck:  ?   Thyroid: No thyromegaly.  ?Cardiovascular:  ?   Rate and Rhythm: Normal rate and regular rhythm.  ?   Pulses: Normal pulses.  ?   Heart sounds: Normal heart sounds.  ?Pulmonary:  ?   Effort: Pulmonary effort is normal. No respiratory distress.  ?   Breath sounds: Normal breath sounds.  ?Abdominal:  ?   General: Bowel sounds are normal. There is no distension.  ?   Palpations: Abdomen is soft.  ?   Tenderness: There is no abdominal tenderness.  ?Musculoskeletal:     ?   General: Normal range of motion.  ?   Cervical back: Neck supple.  ?   Right lower leg: Edema present.  ?   Left lower leg: Edema present.  ?   Comments: Trace   ?Lymphadenopathy:  ?   Cervical: No cervical adenopathy.  ?Skin: ?   General: Skin is warm and dry.  ?Neurological:  ?   Mental Status: She is alert. Mental status is at baseline.  ?Psychiatric:     ?   Mood and Affect: Mood normal.  ? ? ? ?ASSESSMENT/ PLAN: ? ?TODAY ? ?Controlled type 2 diabetes mellitus with stage 3 chronic kidney disease without long term use of insulin: hgb a1c 6.1  will continue asa 81 mg daily  ? ?2. Stage 3 chronic kidney disease due to type 2 diabetes mellitus: bun 35; creat 1.40; GFR 33 ? ?3. Hyperlipidemia associated with type 2 diabetes mellitus: will continue lipitor 20 mg daily  ? ?4. Anemia due to stage 3b chronic kidney disease: will continue iron daily  ? ?5. Increased intraocular pressure bilateral: will continue cosopt both eyes daily; xalatan to both eyes daily  ? ?6. Dementia without behavioral disturbance; weight is 130 pounds.  ? ?7. Protein calorie malnutrition: protein 5.6; albumin 3.3  ? ?8. Chronic depression: will continue remeron 15 mg nightly  ? ?9. Benign hypertension  with CKD (chronic kidney disease) stage III: b/p 154/44  ? ?Will get cbc; cmp; vitamin D; lipids; iron ? ?We have discussed her advanced directives MOST form has been filled out. DNR: no tube feeding; limited hospitalization; ok for abt and ivf. (Times  spent 25 minutes)  ? ? ?Ok Edwards NP ?[facility]  ?call [phone]  ? ?

## 2021-06-05 ENCOUNTER — Non-Acute Institutional Stay (SKILLED_NURSING_FACILITY): Payer: Self-pay | Admitting: Internal Medicine

## 2021-06-05 ENCOUNTER — Encounter: Payer: Self-pay | Admitting: Internal Medicine

## 2021-06-05 DIAGNOSIS — N184 Chronic kidney disease, stage 4 (severe): Secondary | ICD-10-CM | POA: Diagnosis not present

## 2021-06-05 DIAGNOSIS — Z9181 History of falling: Secondary | ICD-10-CM | POA: Diagnosis not present

## 2021-06-05 DIAGNOSIS — G9341 Metabolic encephalopathy: Secondary | ICD-10-CM | POA: Diagnosis not present

## 2021-06-05 DIAGNOSIS — N183 Chronic kidney disease, stage 3 unspecified: Secondary | ICD-10-CM

## 2021-06-05 DIAGNOSIS — F0390 Unspecified dementia without behavioral disturbance: Secondary | ICD-10-CM

## 2021-06-05 DIAGNOSIS — E1169 Type 2 diabetes mellitus with other specified complication: Secondary | ICD-10-CM | POA: Insufficient documentation

## 2021-06-05 DIAGNOSIS — E1122 Type 2 diabetes mellitus with diabetic chronic kidney disease: Secondary | ICD-10-CM

## 2021-06-05 DIAGNOSIS — E441 Mild protein-calorie malnutrition: Secondary | ICD-10-CM

## 2021-06-05 DIAGNOSIS — H40053 Ocular hypertension, bilateral: Secondary | ICD-10-CM | POA: Insufficient documentation

## 2021-06-05 DIAGNOSIS — D631 Anemia in chronic kidney disease: Secondary | ICD-10-CM | POA: Insufficient documentation

## 2021-06-05 DIAGNOSIS — R41841 Cognitive communication deficit: Secondary | ICD-10-CM | POA: Diagnosis not present

## 2021-06-05 DIAGNOSIS — E43 Unspecified severe protein-calorie malnutrition: Secondary | ICD-10-CM | POA: Diagnosis not present

## 2021-06-05 DIAGNOSIS — N1832 Chronic kidney disease, stage 3b: Secondary | ICD-10-CM

## 2021-06-05 DIAGNOSIS — I10 Essential (primary) hypertension: Secondary | ICD-10-CM

## 2021-06-05 DIAGNOSIS — R262 Difficulty in walking, not elsewhere classified: Secondary | ICD-10-CM | POA: Diagnosis not present

## 2021-06-05 DIAGNOSIS — I129 Hypertensive chronic kidney disease with stage 1 through stage 4 chronic kidney disease, or unspecified chronic kidney disease: Secondary | ICD-10-CM | POA: Insufficient documentation

## 2021-06-05 DIAGNOSIS — R2681 Unsteadiness on feet: Secondary | ICD-10-CM | POA: Diagnosis not present

## 2021-06-05 DIAGNOSIS — Z741 Need for assistance with personal care: Secondary | ICD-10-CM | POA: Diagnosis not present

## 2021-06-05 NOTE — Progress Notes (Signed)
? ?  NURSING HOME LOCATION: [place] ?ROOM NUMBER:   ? ?CODE STATUS:   ? ?PCP: Paige Housekeeper, MD ? ?This is a comprehensive admission note to this SNFperformed on this date less than 30 days from date of admission. ?Included are preadmission medical/surgical history; reconciled medication list; family history; social history and comprehensive review of systems.  ?Corrections and additions to the records were documented. Comprehensive physical exam was also performed. Additionally a clinical summary was entered for each active diagnosis pertinent to this admission in the Problem List to enhance continuity of care. ? ?HPI: She was transferred to this facility from [facility] in [place] ,[place]. She had been admitted there because of recurrent falls.  She is to be a permanent resident of this facility. ?Labs were updated 3/28 and revealed creatinine of 1.60 with a GFR 33 indicating CKD low stage IIIb.  LDL was 50 in the context of CAD.  CBC and differential was normal. ? ?Past medical and surgical history: Includes diabetes complicated by stage III CKD, CAD, dyslipidemia, glaucoma,and anemia of chronic disease.   ?Surgeries and procedures include cholecystectomy, coronary artery stenting x3, and retinal detachment surgery. ? ?Social history: She is a former heavy smoker up to 2 packs/day.Currently non drinker. ? ?Family history: Includes stroke in her father, but he lived to be 31.  Her sister has diabetes and hypertension. ?  ?Review of systems: Clinical neurocognitive deficits made validity of responses questionable .  She was unable to provide the date, even the year.  She was unable to name the Paige Wilkins. She said "I know it's not Paige Wilkins".  She confabulated humorously about multiple topics. She said "I'm divorced or I would have killed him!" She could not tell me where she had been living prior to relocating here.  Apparently she had been hospitalized @ [facility] in  [place] ,[place] because of recurrent falls with D/C to rehab.  She has no memory of such.  She also had no memory of having had stents. ? ?Physical exam:  ?Pertinent or positive findings: She confabulates continuously as noted but is very gregarious.  Complete dentures are present.  Pedal pulses are decreased.  She is weaker in the upper extremities to opposition than the lower extremities to opposition.  She has scattered ecchymoses over the forearms, greater on the right than the left. ? ?General appearance: Adequately nourished; no acute distress, increased work of breathing is present.   ?Lymphatic: No lymphadenopathy about the head, neck, axilla. ?Eyes: No conjunctival inflammation or lid edema is present. There is no scleral icterus. ?Ears:  External ear exam shows no significant lesions or deformities.   ?Nose:  External nasal examination shows no deformity or inflammation. Nasal mucosa are pink and moist without lesions, exudates ?Oral exam: There is no oropharyngeal erythema or exudate. ?Neck:  No thyromegaly, masses, tenderness noted.    ?Heart:  Normal rate and regular rhythm. S1 and S2 normal without gallop, murmur, click, rub.  ?Lungs: Chest clear to auscultation without wheezes, rhonchi, rales, rubs. ?Abdomen: Bowel sounds are normal.  Abdomen is soft and nontender with no organomegaly, hernias, masses. ?GU: Deferred  ?Extremities:  No cyanosis, clubbing, edema. ?Neurologic exam:  Balance, Rhomberg, finger to nose testing could not be completed due to clinical state ?Skin: Warm & dry w/o tenting. ?No significant rash. ? ?See clinical summary under each active problem in the Problem List with associated updated therapeutic plan ? ? ?

## 2021-06-05 NOTE — Assessment & Plan Note (Addendum)
Fasting blood sugar is 95 on no diabetic therapy.  A1c will be updated. ?

## 2021-06-05 NOTE — Assessment & Plan Note (Signed)
Blood pressure is adequately controlled without medications. ?

## 2021-06-05 NOTE — Patient Instructions (Signed)
See assessment and plan under each diagnosis in the problem list and acutely for this visit 

## 2021-06-05 NOTE — Assessment & Plan Note (Addendum)
Current CBCdocuments anemia has resolved. ?

## 2021-06-05 NOTE — Assessment & Plan Note (Addendum)
Current creatinine is 1.60 with a GFR of 33 indicating CKD low stage IIIb.  Med list reviewed; Remeron dose of 15 mg is non contraindicated even with GFR less than 30. ?

## 2021-06-05 NOTE — Assessment & Plan Note (Signed)
She is pleasantly demented, very gregarious and exhibiting constant confabulation ?

## 2021-06-06 ENCOUNTER — Other Ambulatory Visit (HOSPITAL_COMMUNITY)
Admission: RE | Admit: 2021-06-06 | Discharge: 2021-06-06 | Disposition: A | Discharge status: Home / Self Care | Payer: Medicare Other | Source: Skilled Nursing Facility | Attending: Adult Health | Admitting: Adult Health

## 2021-06-06 DIAGNOSIS — Z741 Need for assistance with personal care: Secondary | ICD-10-CM | POA: Diagnosis not present

## 2021-06-06 DIAGNOSIS — E46 Unspecified protein-calorie malnutrition: Secondary | ICD-10-CM | POA: Diagnosis not present

## 2021-06-06 DIAGNOSIS — G9341 Metabolic encephalopathy: Secondary | ICD-10-CM | POA: Diagnosis not present

## 2021-06-06 DIAGNOSIS — E43 Unspecified severe protein-calorie malnutrition: Secondary | ICD-10-CM | POA: Diagnosis not present

## 2021-06-06 DIAGNOSIS — I2510 Atherosclerotic heart disease of native coronary artery without angina pectoris: Secondary | ICD-10-CM | POA: Insufficient documentation

## 2021-06-06 DIAGNOSIS — R41841 Cognitive communication deficit: Secondary | ICD-10-CM | POA: Diagnosis not present

## 2021-06-06 DIAGNOSIS — R262 Difficulty in walking, not elsewhere classified: Secondary | ICD-10-CM | POA: Diagnosis not present

## 2021-06-06 DIAGNOSIS — R2681 Unsteadiness on feet: Secondary | ICD-10-CM | POA: Diagnosis not present

## 2021-06-06 DIAGNOSIS — Z9181 History of falling: Secondary | ICD-10-CM | POA: Diagnosis not present

## 2021-06-06 DIAGNOSIS — N184 Chronic kidney disease, stage 4 (severe): Secondary | ICD-10-CM | POA: Diagnosis not present

## 2021-06-06 LAB — IRON AND TIBC
Iron: 31 ug/dL (ref 28–170)
Saturation Ratios: 12 % (ref 10.4–31.8)
TIBC: 249 ug/dL — ABNORMAL LOW (ref 250–450)
UIBC: 218 ug/dL

## 2021-06-06 LAB — VITAMIN D 25 HYDROXY (VIT D DEFICIENCY, FRACTURES): Vit D, 25-Hydroxy: 51.87 ng/mL (ref 30–100)

## 2021-06-06 NOTE — Assessment & Plan Note (Addendum)
Due to protein caloric malnutrition documented on labs & exam; Nutritionist to follow @ SNF ?Albumin 3.3 & total protein 5.6. Weak to opposition in all extremities. History of recurrent falls. ?

## 2021-06-07 ENCOUNTER — Other Ambulatory Visit (HOSPITAL_COMMUNITY)
Admission: RE | Admit: 2021-06-07 | Discharge: 2021-06-07 | Disposition: A | Discharge status: Home / Self Care | Payer: Medicare Other | Source: Skilled Nursing Facility | Attending: Adult Health | Admitting: Adult Health

## 2021-06-07 DIAGNOSIS — D649 Anemia, unspecified: Secondary | ICD-10-CM | POA: Insufficient documentation

## 2021-06-07 DIAGNOSIS — E43 Unspecified severe protein-calorie malnutrition: Secondary | ICD-10-CM | POA: Diagnosis not present

## 2021-06-07 DIAGNOSIS — R2681 Unsteadiness on feet: Secondary | ICD-10-CM | POA: Diagnosis not present

## 2021-06-07 DIAGNOSIS — Z9181 History of falling: Secondary | ICD-10-CM | POA: Diagnosis not present

## 2021-06-07 DIAGNOSIS — N184 Chronic kidney disease, stage 4 (severe): Secondary | ICD-10-CM | POA: Diagnosis not present

## 2021-06-07 DIAGNOSIS — Z741 Need for assistance with personal care: Secondary | ICD-10-CM | POA: Diagnosis not present

## 2021-06-07 DIAGNOSIS — G9341 Metabolic encephalopathy: Secondary | ICD-10-CM | POA: Diagnosis not present

## 2021-06-07 DIAGNOSIS — R262 Difficulty in walking, not elsewhere classified: Secondary | ICD-10-CM | POA: Diagnosis not present

## 2021-06-07 DIAGNOSIS — R41841 Cognitive communication deficit: Secondary | ICD-10-CM | POA: Diagnosis not present

## 2021-06-07 LAB — CBC
HCT: 26.9 % — ABNORMAL LOW (ref 36.0–46.0)
Hemoglobin: 8.6 g/dL — ABNORMAL LOW (ref 12.0–15.0)
MCH: 32.5 pg (ref 26.0–34.0)
MCHC: 32 g/dL (ref 30.0–36.0)
MCV: 101.5 fL — ABNORMAL HIGH (ref 80.0–100.0)
Platelets: 175 10*3/uL (ref 150–400)
RBC: 2.65 MIL/uL — ABNORMAL LOW (ref 3.87–5.11)
RDW: 15.3 % (ref 11.5–15.5)
WBC: 5 10*3/uL (ref 4.0–10.5)
nRBC: 0 % (ref 0.0–0.2)

## 2021-06-08 DIAGNOSIS — R41841 Cognitive communication deficit: Secondary | ICD-10-CM | POA: Diagnosis not present

## 2021-06-08 DIAGNOSIS — Z741 Need for assistance with personal care: Secondary | ICD-10-CM | POA: Diagnosis not present

## 2021-06-08 DIAGNOSIS — Z9181 History of falling: Secondary | ICD-10-CM | POA: Diagnosis not present

## 2021-06-08 DIAGNOSIS — R2681 Unsteadiness on feet: Secondary | ICD-10-CM | POA: Diagnosis not present

## 2021-06-08 DIAGNOSIS — R262 Difficulty in walking, not elsewhere classified: Secondary | ICD-10-CM | POA: Diagnosis not present

## 2021-06-08 DIAGNOSIS — E43 Unspecified severe protein-calorie malnutrition: Secondary | ICD-10-CM | POA: Diagnosis not present

## 2021-06-08 DIAGNOSIS — N184 Chronic kidney disease, stage 4 (severe): Secondary | ICD-10-CM | POA: Diagnosis not present

## 2021-06-08 DIAGNOSIS — G9341 Metabolic encephalopathy: Secondary | ICD-10-CM | POA: Diagnosis not present

## 2021-06-10 ENCOUNTER — Encounter: Payer: Self-pay | Admitting: Adult Health

## 2021-06-10 ENCOUNTER — Non-Acute Institutional Stay (SKILLED_NURSING_FACILITY): Payer: Self-pay | Admitting: Adult Health

## 2021-06-10 DIAGNOSIS — N184 Chronic kidney disease, stage 4 (severe): Secondary | ICD-10-CM | POA: Diagnosis not present

## 2021-06-10 DIAGNOSIS — R2681 Unsteadiness on feet: Secondary | ICD-10-CM | POA: Diagnosis not present

## 2021-06-10 DIAGNOSIS — E43 Unspecified severe protein-calorie malnutrition: Secondary | ICD-10-CM | POA: Diagnosis not present

## 2021-06-10 DIAGNOSIS — G9341 Metabolic encephalopathy: Secondary | ICD-10-CM | POA: Diagnosis not present

## 2021-06-10 DIAGNOSIS — Z9181 History of falling: Secondary | ICD-10-CM | POA: Diagnosis not present

## 2021-06-10 DIAGNOSIS — R262 Difficulty in walking, not elsewhere classified: Secondary | ICD-10-CM | POA: Diagnosis not present

## 2021-06-10 DIAGNOSIS — R41841 Cognitive communication deficit: Secondary | ICD-10-CM | POA: Diagnosis not present

## 2021-06-10 DIAGNOSIS — Z0000 Encounter for general adult medical examination without abnormal findings: Secondary | ICD-10-CM

## 2021-06-10 DIAGNOSIS — Z741 Need for assistance with personal care: Secondary | ICD-10-CM | POA: Diagnosis not present

## 2021-06-10 NOTE — Progress Notes (Signed)
? ?Subjective:  ? Paige Wilkins is a 76 y.o. female who presents for Medicare Annual (Subsequent) preventive examination. ? ?Review of Systems    ?Review of Systems  ?Constitutional:  Negative for malaise/fatigue.  ?Respiratory:  Negative for cough and shortness of breath.   ?Cardiovascular:  Negative for chest pain, palpitations and leg swelling.  ?Gastrointestinal:  Negative for abdominal pain, constipation and heartburn.  ?Musculoskeletal:  Negative for back pain, joint pain and myalgias.  ?Skin: Negative.   ?Neurological:  Negative for dizziness.  ?Psychiatric/Behavioral:  The patient is not nervous/anxious.   ? ?Cardiac Risk Factors include: advanced age (>40men, >36 women);diabetes mellitus;hypertension;sedentary lifestyle ? ?   ?Objective:  ?  ?Today's Vitals  ? 06/10/21 0934  ?BP: 112/76  ?Pulse: 82  ?Resp: 18  ?Temp: (!) 97.3 ?F (36.3 ?C)  ?SpO2: 100%  ?Weight: 134 lb 3.2 oz (60.9 kg)  ? ?There is no height or weight on file to calculate BMI. ? ? ?  06/10/2021  ?  9:46 AM 06/04/2021  ?  1:58 PM  ?Advanced Directives  ?Does Patient Have a Medical Advance Directive? Yes No  ?Type of Advance Directive Out of facility DNR (pink MOST or yellow form)   ?Does patient want to make changes to medical advance directive? No - Patient declined   ?Pre-existing out of facility DNR order (yellow form or pink MOST form) Pink MOST/Yellow Form most recent copy in chart - Physician notified to receive inpatient order   ? ? ?Current Medications (verified) ?Outpatient Encounter Medications as of 06/10/2021  ?Medication Sig  ? aspirin 81 MG tablet Take 81 mg by mouth daily.  ? atorvastatin (LIPITOR) 20 MG tablet Take 20 mg by mouth daily.  ? clobetasol cream (TEMOVATE) 0.05 % Apply to labia/vaginal area twice a week. ?Once A Day on Mon, Fri  ? dorzolamide-timolol (COSOPT) 22.3-6.8 MG/ML ophthalmic solution Place 1 drop into both eyes daily. 1 drop both eyes; ophthalmic.   Wait at least 5 minutes between multiple drops in same  eye.  ? ferrous sulfate 324 MG TBEC Take 324 mg by mouth daily.  ? latanoprost (XALATAN) 0.005 % ophthalmic solution Place 1 drop into both eyes daily.  ? mirtazapine (REMERON) 15 MG tablet Take 15 mg by mouth daily.  ? NON FORMULARY Diet:Regular  ? PRESCRIPTION MEDICATION Endit Skin Protectant Ointment; topical ?Special Instructions: Apply to vaginal/perineal area bid. ?Twice A Day  ? ?No facility-administered encounter medications on file as of 06/10/2021.  ? ? ?Allergies (verified) ?Patient has no known allergies.  ? ?History: ?Past Medical History:  ?Diagnosis Date  ? CAD (coronary artery disease)   ? DM (diabetes mellitus) (HCC)   ? Glaucoma   ? HTN (hypertension)   ? Obesity   ? Osteoarthritis   ? ?Past Surgical History:  ?Procedure Laterality Date  ? CHOLECYSTECTOMY    ? CORONARY STENT PLACEMENT    ? x3  ? REFRACTIVE SURGERY    ? detached retina  ? TUBAL LIGATION    ? 76 yrs old  ? ?Family History  ?Problem Relation Age of Onset  ? Stroke Father   ? Diabetes Sister   ? Hypertension Sister   ? Arthritis Other   ? Heart disease Other   ? Hypertension Other   ? Stroke Other   ? Kidney disease Other   ? Diabetes Other   ? ?Social History  ? ?Socioeconomic History  ? Marital status: Divorced  ?  Spouse name: Not on file  ? Number  of children: 2  ? Years of education: Not on file  ? Highest education level: Not on file  ?Occupational History  ? Occupation: retired  ?Tobacco Use  ? Smoking status: Every Day  ?  Packs/day: 1.00  ?  Types: Cigarettes  ? Smokeless tobacco: Not on file  ?Substance and Sexual Activity  ? Alcohol use: Yes  ?  Comment: rarely  ? Drug use: Not on file  ? Sexual activity: Not on file  ?Other Topics Concern  ? Not on file  ?Social History Narrative  ? Not on file  ? ?Social Determinants of Health  ? ?Financial Resource Strain: Not on file  ?Food Insecurity: Not on file  ?Transportation Needs: Not on file  ?Physical Activity: Not on file  ?Stress: Not on file  ?Social Connections: Not on file   ? ? ?Tobacco Counseling ?Ready to quit: Not Answered ?Counseling given: Not Answered ? ? ?Clinical Intake: ? ?Pre-visit preparation completed: Yes ? ?Pain : No/denies pain ? ?  ? ?Nutritional Status: BMI of 19-24  Normal ?Nutritional Risks: Unintentional weight loss ?Diabetes: Yes ?CBG done?: Yes ?CBG resulted in Enter/ Edit results?: Yes ?Did pt. bring in CBG monitor from home?: No ? ?How often do you need to have someone help you when you read instructions, pamphlets, or other written materials from your doctor or pharmacy?: 5 - Always ? ?Diabetic?yes ? ?Interpreter Needed?: No ? ?  ? ? ?Activities of Daily Living ? ?  06/10/2021  ?  2:13 PM  ?In your present state of health, do you have any difficulty performing the following activities:  ?Hearing? 0  ?Vision? 0  ?Difficulty concentrating or making decisions? 1  ?Walking or climbing stairs? 1  ?Dressing or bathing? 1  ?Preparing Food and eating ? Y  ?Using the Toilet? Y  ?In the past six months, have you accidently leaked urine? Y  ?Managing your Medications? Y  ?Managing your Finances? Y  ?Housekeeping or managing your Housekeeping? Y  ? ? ?Patient Care Team: ?Sharee HolsterGreen, Reo Portela S, NP as PCP - General (Geriatric Medicine) ?[facility] ? ?Indicate any recent Medical Services you may have received from other than [facility] providers in the past year (date may be approximate). ? ?   ?Assessment:  ? This is a routine wellness examination for [name]. ? ?Hearing/Vision screen ?No results found. ? ?Dietary issues and exercise activities discussed: ?Current Exercise Habits: The patient does not participate in regular exercise at present, Exercise limited by: None identified ? ? Goals Addressed   ? ?  ?  ?  ?  ? This Visit's Progress  ?  Absence of Fall and Fall-Related Injury     ?  Evidence-based guidance:  ?Assess fall risk using a validated tool when available. Consider balance and gait impairment, muscle weakness, diminished vision or  hearing, environmental hazards, presence of urinary or bowel urgency and/or incontinence.  ?Communicate fall injury risk to interprofessional healthcare team.  ?Develop a fall prevention plan with the patient and family.  ?Promote use of personal vision and auditory aids.  ?Promote reorientation, appropriate sensory stimulation, and routines to decrease risk of fall when changes in mental status are present.  ?Assess assistance level required for safe and effective self-care; consider referral for home care.  ?Encourage physical activity, such as performance of self-care at highest level of ability, strength and balance exercise program, and provision of appropriate assistive devices; refer to rehabilitation therapy.  ?Refer to community-based fall prevention program  where available.  ?If fall occurs, determine the cause and revise fall injury prevention plan.  ?Regularly review medication contribution to fall risk; consider risk related to polypharmacy and age.  ?Refer to pharmacist for consultation when concerns about medications are revealed.  ?Balance adequate pain management with potential for oversedation.  ?Provide guidance related to environmental modifications.  ?Consider supplementation with Vitamin D.   ?Notes:  ?  ?  Follow up with Primary Care Provider     ?  General - Client will not be readmitted within 30 days ([facility])     ? ?  ? ?Depression Screen ? ?  06/10/2021  ?  2:13 PM  ?PHQ 2/9 Scores  ?PHQ - 2 Score 0  ?  ?Fall Risk ? ?  06/10/2021  ?  2:13 PM  ?Fall Risk   ?Falls in the past year? 0  ?Number falls in past yr: 0  ?Injury with Fall? 0  ?Risk for fall due to : Impaired balance/gait;Impaired mobility  ? ? ?FALL RISK PREVENTION PERTAINING TO THE HOME: ? ?Any stairs in or around the home? Yes  ?If so, are there any without handrails? No  ?Home free of loose throw rugs in walkways, pet beds, electrical cords, etc? Yes  ?Adequate lighting in your home to reduce risk of falls? Yes  ? ?ASSISTIVE DEVICES  UTILIZED TO PREVENT FALLS: ? ?Life alert? No  ?Use of a cane, walker or w/c? Yes  ?Grab bars in the bathroom? Yes  ?Shower chair or bench in shower? Yes  ?Elevated toilet seat or a handicapped toilet? Yes

## 2021-06-10 NOTE — Patient Instructions (Signed)
?  Ms. Graybill , ?Thank you for taking time to come for your Medicare Wellness Visit. I appreciate your ongoing commitment to your health goals. Please review the following plan we discussed and let me know if I can assist you in the future.  ? ?These are the goals we discussed: ? Goals   ? ?  Absence of Fall and Fall-Related Injury   ?  Evidence-based guidance:  ?Assess fall risk using a validated tool when available. Consider balance and gait impairment, muscle weakness, diminished vision or hearing, environmental hazards, presence of urinary or bowel urgency and/or incontinence.  ?Communicate fall injury risk to interprofessional healthcare team.  ?Develop a fall prevention plan with the patient and family.  ?Promote use of personal vision and auditory aids.  ?Promote reorientation, appropriate sensory stimulation, and routines to decrease risk of fall when changes in mental status are present.  ?Assess assistance level required for safe and effective self-care; consider referral for home care.  ?Encourage physical activity, such as performance of self-care at highest level of ability, strength and balance exercise program, and provision of appropriate assistive devices; refer to rehabilitation therapy.  ?Refer to community-based fall prevention program where available.  ?If fall occurs, determine the cause and revise fall injury prevention plan.  ?Regularly review medication contribution to fall risk; consider risk related to polypharmacy and age.  ?Refer to pharmacist for consultation when concerns about medications are revealed.  ?Balance adequate pain management with potential for oversedation.  ?Provide guidance related to environmental modifications.  ?Consider supplementation with Vitamin D.   ?Notes:  ?  ?  Follow up with Primary Care Provider   ?  General - Client will not be readmitted within 30 days ([facility])   ? ?  ?  ?This is a list of the screening recommended for you and due dates:  ?Health  Maintenance  ?Topic Date Due  ? Hemoglobin A1C  Never done  ? Pneumonia Vaccine (1 - PCV) 03/21/1951  ? Complete foot exam   Never done  ? Eye exam for diabetics  Never done  ? Urine Protein Check  Never done  ? Hepatitis C Screening: USPSTF Recommendation to screen - Ages 3-79 yo.  Never done  ? Zoster (Shingles) Vaccine (1 of 2) Never done  ? DEXA scan (bone density measurement)  Never done  ? COVID-19 Vaccine (5 - Booster for Pfizer series) 09/18/2020  ? Flu Shot  10/08/2021  ? Tetanus Vaccine  07/04/2025  ? HPV Vaccine  Aged Out  ? ? ?

## 2021-06-11 DIAGNOSIS — G9341 Metabolic encephalopathy: Secondary | ICD-10-CM | POA: Diagnosis not present

## 2021-06-11 DIAGNOSIS — Z9181 History of falling: Secondary | ICD-10-CM | POA: Diagnosis not present

## 2021-06-11 DIAGNOSIS — Z741 Need for assistance with personal care: Secondary | ICD-10-CM | POA: Diagnosis not present

## 2021-06-11 DIAGNOSIS — N184 Chronic kidney disease, stage 4 (severe): Secondary | ICD-10-CM | POA: Diagnosis not present

## 2021-06-11 DIAGNOSIS — R262 Difficulty in walking, not elsewhere classified: Secondary | ICD-10-CM | POA: Diagnosis not present

## 2021-06-11 DIAGNOSIS — R2681 Unsteadiness on feet: Secondary | ICD-10-CM | POA: Diagnosis not present

## 2021-06-11 DIAGNOSIS — E43 Unspecified severe protein-calorie malnutrition: Secondary | ICD-10-CM | POA: Diagnosis not present

## 2021-06-11 DIAGNOSIS — R41841 Cognitive communication deficit: Secondary | ICD-10-CM | POA: Diagnosis not present

## 2021-06-12 ENCOUNTER — Other Ambulatory Visit (HOSPITAL_COMMUNITY): Payer: Self-pay | Admitting: Internal Medicine

## 2021-06-12 DIAGNOSIS — Z1231 Encounter for screening mammogram for malignant neoplasm of breast: Secondary | ICD-10-CM

## 2021-06-12 DIAGNOSIS — R262 Difficulty in walking, not elsewhere classified: Secondary | ICD-10-CM | POA: Diagnosis not present

## 2021-06-12 DIAGNOSIS — Z9181 History of falling: Secondary | ICD-10-CM | POA: Diagnosis not present

## 2021-06-12 DIAGNOSIS — R2681 Unsteadiness on feet: Secondary | ICD-10-CM | POA: Diagnosis not present

## 2021-06-12 DIAGNOSIS — N184 Chronic kidney disease, stage 4 (severe): Secondary | ICD-10-CM | POA: Diagnosis not present

## 2021-06-12 DIAGNOSIS — E43 Unspecified severe protein-calorie malnutrition: Secondary | ICD-10-CM | POA: Diagnosis not present

## 2021-06-12 DIAGNOSIS — G9341 Metabolic encephalopathy: Secondary | ICD-10-CM | POA: Diagnosis not present

## 2021-06-12 DIAGNOSIS — R41841 Cognitive communication deficit: Secondary | ICD-10-CM | POA: Diagnosis not present

## 2021-06-12 DIAGNOSIS — Z741 Need for assistance with personal care: Secondary | ICD-10-CM | POA: Diagnosis not present

## 2021-06-13 DIAGNOSIS — Z9181 History of falling: Secondary | ICD-10-CM | POA: Diagnosis not present

## 2021-06-13 DIAGNOSIS — Z741 Need for assistance with personal care: Secondary | ICD-10-CM | POA: Diagnosis not present

## 2021-06-13 DIAGNOSIS — E43 Unspecified severe protein-calorie malnutrition: Secondary | ICD-10-CM | POA: Diagnosis not present

## 2021-06-13 DIAGNOSIS — R41841 Cognitive communication deficit: Secondary | ICD-10-CM | POA: Diagnosis not present

## 2021-06-13 DIAGNOSIS — R262 Difficulty in walking, not elsewhere classified: Secondary | ICD-10-CM | POA: Diagnosis not present

## 2021-06-13 DIAGNOSIS — G9341 Metabolic encephalopathy: Secondary | ICD-10-CM | POA: Diagnosis not present

## 2021-06-13 DIAGNOSIS — N184 Chronic kidney disease, stage 4 (severe): Secondary | ICD-10-CM | POA: Diagnosis not present

## 2021-06-13 DIAGNOSIS — R2681 Unsteadiness on feet: Secondary | ICD-10-CM | POA: Diagnosis not present

## 2021-06-14 DIAGNOSIS — Z741 Need for assistance with personal care: Secondary | ICD-10-CM | POA: Diagnosis not present

## 2021-06-14 DIAGNOSIS — R2681 Unsteadiness on feet: Secondary | ICD-10-CM | POA: Diagnosis not present

## 2021-06-14 DIAGNOSIS — Z9181 History of falling: Secondary | ICD-10-CM | POA: Diagnosis not present

## 2021-06-14 DIAGNOSIS — R41841 Cognitive communication deficit: Secondary | ICD-10-CM | POA: Diagnosis not present

## 2021-06-14 DIAGNOSIS — E43 Unspecified severe protein-calorie malnutrition: Secondary | ICD-10-CM | POA: Diagnosis not present

## 2021-06-14 DIAGNOSIS — R262 Difficulty in walking, not elsewhere classified: Secondary | ICD-10-CM | POA: Diagnosis not present

## 2021-06-14 DIAGNOSIS — G9341 Metabolic encephalopathy: Secondary | ICD-10-CM | POA: Diagnosis not present

## 2021-06-14 DIAGNOSIS — N184 Chronic kidney disease, stage 4 (severe): Secondary | ICD-10-CM | POA: Diagnosis not present

## 2021-06-16 DIAGNOSIS — Z9181 History of falling: Secondary | ICD-10-CM | POA: Diagnosis not present

## 2021-06-16 DIAGNOSIS — Z741 Need for assistance with personal care: Secondary | ICD-10-CM | POA: Diagnosis not present

## 2021-06-16 DIAGNOSIS — N184 Chronic kidney disease, stage 4 (severe): Secondary | ICD-10-CM | POA: Diagnosis not present

## 2021-06-16 DIAGNOSIS — R41841 Cognitive communication deficit: Secondary | ICD-10-CM | POA: Diagnosis not present

## 2021-06-16 DIAGNOSIS — G9341 Metabolic encephalopathy: Secondary | ICD-10-CM | POA: Diagnosis not present

## 2021-06-16 DIAGNOSIS — R2681 Unsteadiness on feet: Secondary | ICD-10-CM | POA: Diagnosis not present

## 2021-06-16 DIAGNOSIS — R262 Difficulty in walking, not elsewhere classified: Secondary | ICD-10-CM | POA: Diagnosis not present

## 2021-06-16 DIAGNOSIS — E43 Unspecified severe protein-calorie malnutrition: Secondary | ICD-10-CM | POA: Diagnosis not present

## 2021-06-17 DIAGNOSIS — Z1159 Encounter for screening for other viral diseases: Secondary | ICD-10-CM | POA: Diagnosis not present

## 2021-06-17 DIAGNOSIS — Z741 Need for assistance with personal care: Secondary | ICD-10-CM | POA: Diagnosis not present

## 2021-06-17 DIAGNOSIS — G9341 Metabolic encephalopathy: Secondary | ICD-10-CM | POA: Diagnosis not present

## 2021-06-17 DIAGNOSIS — N184 Chronic kidney disease, stage 4 (severe): Secondary | ICD-10-CM | POA: Diagnosis not present

## 2021-06-17 DIAGNOSIS — E43 Unspecified severe protein-calorie malnutrition: Secondary | ICD-10-CM | POA: Diagnosis not present

## 2021-06-17 DIAGNOSIS — R2681 Unsteadiness on feet: Secondary | ICD-10-CM | POA: Diagnosis not present

## 2021-06-17 DIAGNOSIS — R41841 Cognitive communication deficit: Secondary | ICD-10-CM | POA: Diagnosis not present

## 2021-06-17 DIAGNOSIS — I1310 Hypertensive heart and chronic kidney disease without heart failure, with stage 1 through stage 4 chronic kidney disease, or unspecified chronic kidney disease: Secondary | ICD-10-CM | POA: Diagnosis not present

## 2021-06-17 DIAGNOSIS — Z9181 History of falling: Secondary | ICD-10-CM | POA: Diagnosis not present

## 2021-06-17 DIAGNOSIS — R262 Difficulty in walking, not elsewhere classified: Secondary | ICD-10-CM | POA: Diagnosis not present

## 2021-06-18 DIAGNOSIS — N184 Chronic kidney disease, stage 4 (severe): Secondary | ICD-10-CM | POA: Diagnosis not present

## 2021-06-18 DIAGNOSIS — G9341 Metabolic encephalopathy: Secondary | ICD-10-CM | POA: Diagnosis not present

## 2021-06-18 DIAGNOSIS — E43 Unspecified severe protein-calorie malnutrition: Secondary | ICD-10-CM | POA: Diagnosis not present

## 2021-06-18 DIAGNOSIS — Z9181 History of falling: Secondary | ICD-10-CM | POA: Diagnosis not present

## 2021-06-18 DIAGNOSIS — R262 Difficulty in walking, not elsewhere classified: Secondary | ICD-10-CM | POA: Diagnosis not present

## 2021-06-18 DIAGNOSIS — R41841 Cognitive communication deficit: Secondary | ICD-10-CM | POA: Diagnosis not present

## 2021-06-18 DIAGNOSIS — R2681 Unsteadiness on feet: Secondary | ICD-10-CM | POA: Diagnosis not present

## 2021-06-18 DIAGNOSIS — Z741 Need for assistance with personal care: Secondary | ICD-10-CM | POA: Diagnosis not present

## 2021-06-19 ENCOUNTER — Ambulatory Visit (HOSPITAL_COMMUNITY): Payer: Medicare Other

## 2021-06-19 DIAGNOSIS — G9341 Metabolic encephalopathy: Secondary | ICD-10-CM | POA: Diagnosis not present

## 2021-06-19 DIAGNOSIS — Z9181 History of falling: Secondary | ICD-10-CM | POA: Diagnosis not present

## 2021-06-19 DIAGNOSIS — E43 Unspecified severe protein-calorie malnutrition: Secondary | ICD-10-CM | POA: Diagnosis not present

## 2021-06-19 DIAGNOSIS — R262 Difficulty in walking, not elsewhere classified: Secondary | ICD-10-CM | POA: Diagnosis not present

## 2021-06-19 DIAGNOSIS — R41841 Cognitive communication deficit: Secondary | ICD-10-CM | POA: Diagnosis not present

## 2021-06-19 DIAGNOSIS — N184 Chronic kidney disease, stage 4 (severe): Secondary | ICD-10-CM | POA: Diagnosis not present

## 2021-06-19 DIAGNOSIS — R2681 Unsteadiness on feet: Secondary | ICD-10-CM | POA: Diagnosis not present

## 2021-06-19 DIAGNOSIS — Z741 Need for assistance with personal care: Secondary | ICD-10-CM | POA: Diagnosis not present

## 2021-06-20 DIAGNOSIS — N184 Chronic kidney disease, stage 4 (severe): Secondary | ICD-10-CM | POA: Diagnosis not present

## 2021-06-20 DIAGNOSIS — R2681 Unsteadiness on feet: Secondary | ICD-10-CM | POA: Diagnosis not present

## 2021-06-20 DIAGNOSIS — Z9181 History of falling: Secondary | ICD-10-CM | POA: Diagnosis not present

## 2021-06-20 DIAGNOSIS — R262 Difficulty in walking, not elsewhere classified: Secondary | ICD-10-CM | POA: Diagnosis not present

## 2021-06-20 DIAGNOSIS — G9341 Metabolic encephalopathy: Secondary | ICD-10-CM | POA: Diagnosis not present

## 2021-06-20 DIAGNOSIS — R41841 Cognitive communication deficit: Secondary | ICD-10-CM | POA: Diagnosis not present

## 2021-06-20 DIAGNOSIS — Z741 Need for assistance with personal care: Secondary | ICD-10-CM | POA: Diagnosis not present

## 2021-06-20 DIAGNOSIS — E43 Unspecified severe protein-calorie malnutrition: Secondary | ICD-10-CM | POA: Diagnosis not present

## 2021-06-21 ENCOUNTER — Ambulatory Visit (HOSPITAL_COMMUNITY)
Admission: RE | Admit: 2021-06-21 | Discharge: 2021-06-21 | Disposition: A | Discharge status: Home / Self Care | Payer: Medicare Other | Source: Ambulatory Visit | Attending: Internal Medicine | Admitting: Internal Medicine

## 2021-06-21 ENCOUNTER — Non-Acute Institutional Stay (SKILLED_NURSING_FACILITY): Payer: Medicare Other | Admitting: Adult Health

## 2021-06-21 ENCOUNTER — Encounter: Payer: Self-pay | Admitting: Adult Health

## 2021-06-21 DIAGNOSIS — R41841 Cognitive communication deficit: Secondary | ICD-10-CM | POA: Diagnosis not present

## 2021-06-21 DIAGNOSIS — E441 Mild protein-calorie malnutrition: Secondary | ICD-10-CM | POA: Diagnosis not present

## 2021-06-21 DIAGNOSIS — Z741 Need for assistance with personal care: Secondary | ICD-10-CM | POA: Diagnosis not present

## 2021-06-21 DIAGNOSIS — Z9181 History of falling: Secondary | ICD-10-CM | POA: Diagnosis not present

## 2021-06-21 DIAGNOSIS — R2681 Unsteadiness on feet: Secondary | ICD-10-CM | POA: Diagnosis not present

## 2021-06-21 DIAGNOSIS — N184 Chronic kidney disease, stage 4 (severe): Secondary | ICD-10-CM | POA: Diagnosis not present

## 2021-06-21 DIAGNOSIS — G9341 Metabolic encephalopathy: Secondary | ICD-10-CM | POA: Diagnosis not present

## 2021-06-21 DIAGNOSIS — E43 Unspecified severe protein-calorie malnutrition: Secondary | ICD-10-CM | POA: Diagnosis not present

## 2021-06-21 DIAGNOSIS — R262 Difficulty in walking, not elsewhere classified: Secondary | ICD-10-CM | POA: Diagnosis not present

## 2021-06-21 DIAGNOSIS — F0390 Unspecified dementia without behavioral disturbance: Secondary | ICD-10-CM | POA: Diagnosis not present

## 2021-06-21 DIAGNOSIS — Z1231 Encounter for screening mammogram for malignant neoplasm of breast: Secondary | ICD-10-CM | POA: Diagnosis not present

## 2021-06-21 DIAGNOSIS — N183 Chronic kidney disease, stage 3 unspecified: Secondary | ICD-10-CM | POA: Diagnosis not present

## 2021-06-21 DIAGNOSIS — I129 Hypertensive chronic kidney disease with stage 1 through stage 4 chronic kidney disease, or unspecified chronic kidney disease: Secondary | ICD-10-CM

## 2021-06-21 NOTE — Progress Notes (Signed)
?Location:  [place] ?Nursing Home Room Number: 101-D ?Place of Service:  SNF (31) ? ? ?CODE STATUS: DNR ? ?No Known Allergies ? ?Chief Complaint  ?Patient presents with  ? Acute Visit  ?  Care plan meeting  ? ? ?HPI: ? ?We have come together for her care plan meeting. Family present  BIMS 8/15 mood 2/30: anxious at times. Does lock self  in bathroom at times. She has not had a falls. She requires limited to extensive assist with adls. She is frequently incontinent of bladder and bowel. Dietary: regular diet; feeds self. Good appetite; weight is 142 pounds up 3 pounds from admission. Therapy: pt/ot/st. Wheelchair primary mode of transportation; 30-40 feet min assist; min assist lower body supervision upper body and brp . No falls. She continues to be followed for her chronic illnesses including:   Benign hypertension associated with CKD (chronic kidney disease) stage 3 Dementia without behavioral disturbance  Mild protein calorie malnutrition  ? ?Past Medical History:  ?Diagnosis Date  ? CAD (coronary artery disease)   ? DM (diabetes mellitus) ([place])   ? Glaucoma   ? HTN (hypertension)   ? Obesity   ? Osteoarthritis   ? ? ?Past Surgical History:  ?Procedure Laterality Date  ? CHOLECYSTECTOMY    ? CORONARY STENT PLACEMENT    ? x3  ? REFRACTIVE SURGERY    ? detached retina  ? TUBAL LIGATION    ? 76 yrs old  ? ? ?Social History  ? ?Socioeconomic History  ? Marital status: Divorced  ?  Spouse name: Not on file  ? Number of children: 2  ? Years of education: Not on file  ? Highest education level: Not on file  ?Occupational History  ? Occupation: retired  ?Tobacco Use  ? Smoking status: Every Day  ?  Packs/day: 1.00  ?  Types: Cigarettes  ? Smokeless tobacco: Not on file  ?Substance and Sexual Activity  ? Alcohol use: Yes  ?  Comment: rarely  ? Drug use: Not on file  ? Sexual activity: Not on file  ?Other Topics Concern  ? Not on file  ?Social History Narrative  ? Not on file  ? ?Social Determinants of Health   ? ?Financial Resource Strain: Not on file  ?Food Insecurity: Not on file  ?Transportation Needs: Not on file  ?Physical Activity: Not on file  ?Stress: Not on file  ?Social Connections: Not on file  ?Intimate Partner Violence: Not on file  ? ?Family History  ?Problem Relation Age of Onset  ? Stroke Father   ? Diabetes Sister   ? Hypertension Sister   ? Arthritis Other   ? Heart disease Other   ? Hypertension Other   ? Stroke Other   ? Kidney disease Other   ? Diabetes Other   ? ? ? ? ?VITAL SIGNS ?BP (!) 128/52   Pulse 80   Temp 98.2 ?F (36.8 ?C)   Resp 20   Wt 142 lb 9.6 oz (64.7 kg)   SpO2 100%  ? ?Outpatient Encounter Medications as of 06/21/2021  ?Medication Sig  ? aspirin 81 MG tablet Take 81 mg by mouth daily.  ? atorvastatin (LIPITOR) 20 MG tablet Take 20 mg by mouth daily.  ? clobetasol cream (TEMOVATE) 0.05 % Apply to labia/vaginal area twice a week. ?Once A Day on Mon, Fri  ? dorzolamide-timolol (COSOPT) 22.3-6.8 MG/ML ophthalmic solution Place 1 drop into both eyes daily. 1 drop both eyes; ophthalmic.   Wait  at least 5 minutes between multiple drops in same eye.  ? ferrous sulfate 325 (65 FE) MG tablet Take 325 mg by mouth daily.  ? latanoprost (XALATAN) 0.005 % ophthalmic solution Place 1 drop into both eyes daily.  ? mirtazapine (REMERON) 15 MG tablet Take 15 mg by mouth daily.  ? NON FORMULARY Diet:Regular  ? PRESCRIPTION MEDICATION Endit Skin Protectant Ointment; topical ?Special Instructions: Apply to vaginal/perineal area bid. ?Twice A Day  ? [DISCONTINUED] ferrous sulfate 324 MG TBEC Take 324 mg by mouth daily.  ? ?No facility-administered encounter medications on file as of 06/21/2021.  ? ? ? ?SIGNIFICANT DIAGNOSTIC EXAMS ? ?PREVIOUS ? ?05-21-21: hgb a1c 6.1; tsh 1.689; uric acid: 7.2  ?06-04-21: glucose 95; bun 35; creat 1.40; k+ 4.1; na++ 135; ca 8.4 protein 5.6; albumin 3.3 chol 107; ldl 50; trig 32; hdl 51 ? ?NO NEW LABS.  ? ?Review of Systems  ?Constitutional:  Negative for malaise/fatigue.   ?Respiratory:  Negative for cough and shortness of breath.   ?Cardiovascular:  Negative for chest pain, palpitations and leg swelling.  ?Gastrointestinal:  Negative for abdominal pain, constipation and heartburn.  ?Musculoskeletal:  Negative for back pain, joint pain and myalgias.  ?Skin: Negative.   ?Neurological:  Negative for dizziness.  ?Psychiatric/Behavioral:  The patient is not nervous/anxious.   ? ?Physical Exam ?Constitutional:   ?   General: She is not in acute distress. ?   Appearance: She is well-developed. She is not diaphoretic.  ?Neck:  ?   Thyroid: No thyromegaly.  ?Cardiovascular:  ?   Rate and Rhythm: Normal rate and regular rhythm.  ?   Pulses: Normal pulses.  ?   Heart sounds: Normal heart sounds.  ?Pulmonary:  ?   Effort: Pulmonary effort is normal. No respiratory distress.  ?   Breath sounds: Normal breath sounds.  ?Abdominal:  ?   General: Bowel sounds are normal. There is no distension.  ?   Palpations: Abdomen is soft.  ?   Tenderness: There is no abdominal tenderness.  ?Musculoskeletal:     ?   General: Normal range of motion.  ?   Cervical back: Neck supple.  ?   Right lower leg: No edema.  ?   Left lower leg: No edema.  ?Lymphadenopathy:  ?   Cervical: No cervical adenopathy.  ?Skin: ?   General: Skin is warm and dry.  ?Neurological:  ?   Mental Status: She is alert. Mental status is at baseline.  ?Psychiatric:     ?   Mood and Affect: Mood normal.  ? ? ? ?ASSESSMENT/ PLAN: ? ?TODAY ? ?Benign hypertension associated with CKD (chronic kidney disease) stage 3 ?Dementia without behavioral disturbance ?Mild protein calorie malnutrition  ? ?Will continue current medications ?Will continue current plan of care ?Will continue to monitor her status.  ? ? ?Time spent with patient: 40 minutes: care plan; medications; goals of care.  ? ?Ok Edwards NP ?[facility]  ? call [phone]  ? ?

## 2021-06-24 DIAGNOSIS — N184 Chronic kidney disease, stage 4 (severe): Secondary | ICD-10-CM | POA: Diagnosis not present

## 2021-06-24 DIAGNOSIS — R41841 Cognitive communication deficit: Secondary | ICD-10-CM | POA: Diagnosis not present

## 2021-06-24 DIAGNOSIS — Z741 Need for assistance with personal care: Secondary | ICD-10-CM | POA: Diagnosis not present

## 2021-06-24 DIAGNOSIS — R262 Difficulty in walking, not elsewhere classified: Secondary | ICD-10-CM | POA: Diagnosis not present

## 2021-06-24 DIAGNOSIS — E43 Unspecified severe protein-calorie malnutrition: Secondary | ICD-10-CM | POA: Diagnosis not present

## 2021-06-24 DIAGNOSIS — R2681 Unsteadiness on feet: Secondary | ICD-10-CM | POA: Diagnosis not present

## 2021-06-24 DIAGNOSIS — Z9181 History of falling: Secondary | ICD-10-CM | POA: Diagnosis not present

## 2021-06-24 DIAGNOSIS — G9341 Metabolic encephalopathy: Secondary | ICD-10-CM | POA: Diagnosis not present

## 2021-06-25 DIAGNOSIS — G9341 Metabolic encephalopathy: Secondary | ICD-10-CM | POA: Diagnosis not present

## 2021-06-25 DIAGNOSIS — E43 Unspecified severe protein-calorie malnutrition: Secondary | ICD-10-CM | POA: Diagnosis not present

## 2021-06-25 DIAGNOSIS — Z9181 History of falling: Secondary | ICD-10-CM | POA: Diagnosis not present

## 2021-06-25 DIAGNOSIS — R262 Difficulty in walking, not elsewhere classified: Secondary | ICD-10-CM | POA: Diagnosis not present

## 2021-06-25 DIAGNOSIS — R41841 Cognitive communication deficit: Secondary | ICD-10-CM | POA: Diagnosis not present

## 2021-06-25 DIAGNOSIS — Z741 Need for assistance with personal care: Secondary | ICD-10-CM | POA: Diagnosis not present

## 2021-06-25 DIAGNOSIS — R2681 Unsteadiness on feet: Secondary | ICD-10-CM | POA: Diagnosis not present

## 2021-06-25 DIAGNOSIS — N184 Chronic kidney disease, stage 4 (severe): Secondary | ICD-10-CM | POA: Diagnosis not present

## 2021-06-26 DIAGNOSIS — E43 Unspecified severe protein-calorie malnutrition: Secondary | ICD-10-CM | POA: Diagnosis not present

## 2021-06-26 DIAGNOSIS — R262 Difficulty in walking, not elsewhere classified: Secondary | ICD-10-CM | POA: Diagnosis not present

## 2021-06-26 DIAGNOSIS — G9341 Metabolic encephalopathy: Secondary | ICD-10-CM | POA: Diagnosis not present

## 2021-06-26 DIAGNOSIS — R41841 Cognitive communication deficit: Secondary | ICD-10-CM | POA: Diagnosis not present

## 2021-06-26 DIAGNOSIS — R2681 Unsteadiness on feet: Secondary | ICD-10-CM | POA: Diagnosis not present

## 2021-06-26 DIAGNOSIS — N184 Chronic kidney disease, stage 4 (severe): Secondary | ICD-10-CM | POA: Diagnosis not present

## 2021-06-26 DIAGNOSIS — Z741 Need for assistance with personal care: Secondary | ICD-10-CM | POA: Diagnosis not present

## 2021-06-26 DIAGNOSIS — Z9181 History of falling: Secondary | ICD-10-CM | POA: Diagnosis not present

## 2021-06-27 DIAGNOSIS — E43 Unspecified severe protein-calorie malnutrition: Secondary | ICD-10-CM | POA: Diagnosis not present

## 2021-06-27 DIAGNOSIS — R2681 Unsteadiness on feet: Secondary | ICD-10-CM | POA: Diagnosis not present

## 2021-06-27 DIAGNOSIS — Z9181 History of falling: Secondary | ICD-10-CM | POA: Diagnosis not present

## 2021-06-27 DIAGNOSIS — Z741 Need for assistance with personal care: Secondary | ICD-10-CM | POA: Diagnosis not present

## 2021-06-27 DIAGNOSIS — N184 Chronic kidney disease, stage 4 (severe): Secondary | ICD-10-CM | POA: Diagnosis not present

## 2021-06-27 DIAGNOSIS — R41841 Cognitive communication deficit: Secondary | ICD-10-CM | POA: Diagnosis not present

## 2021-06-27 DIAGNOSIS — G9341 Metabolic encephalopathy: Secondary | ICD-10-CM | POA: Diagnosis not present

## 2021-06-27 DIAGNOSIS — R262 Difficulty in walking, not elsewhere classified: Secondary | ICD-10-CM | POA: Diagnosis not present

## 2021-07-03 ENCOUNTER — Encounter: Payer: Self-pay | Admitting: Adult Health

## 2021-07-03 ENCOUNTER — Non-Acute Institutional Stay (SKILLED_NURSING_FACILITY): Payer: Medicare Other | Admitting: Adult Health

## 2021-07-03 DIAGNOSIS — N183 Chronic kidney disease, stage 3 unspecified: Secondary | ICD-10-CM

## 2021-07-03 DIAGNOSIS — E1122 Type 2 diabetes mellitus with diabetic chronic kidney disease: Secondary | ICD-10-CM | POA: Diagnosis not present

## 2021-07-03 DIAGNOSIS — E1169 Type 2 diabetes mellitus with other specified complication: Secondary | ICD-10-CM | POA: Diagnosis not present

## 2021-07-03 DIAGNOSIS — E785 Hyperlipidemia, unspecified: Secondary | ICD-10-CM

## 2021-07-03 NOTE — Progress Notes (Signed)
?Location:  [place] ?Nursing Home Room Number: N101/D ?Place of Service:  SNF (31) ? ? ?CODE STATUS: DNR ? ?No Known Allergies ? ?Chief Complaint  ?Patient presents with  ? Medical Management of Chronic Issues ? ?                  Controlled type 2 diabetes mellitus with stage 3 chronic kidney disease without long term current use of insulin:. Stage 3 chronic kidney disease due to type 2 diabetes mellitus:  Hyperlipidemia associated with type 2 diabetes mellitus  ? ? ? ?HPI: ? ?She is a 76 year old long term resident of this facility being seen for the management of her chronic illnesses: Controlled type 2 diabetes mellitus with stage 3 chronic kidney disease without long term current use of insulin:. Stage 3 chronic kidney disease due to type 2 diabetes mellitus:  Hyperlipidemia associated with type 2 diabetes mellitus. There are no reports of uncontrolled pain. There are no reports of changes in appetite. No reports of anxiety or agitation.  ? ?Past Medical History:  ?Diagnosis Date  ? CAD (coronary artery disease)   ? DM (diabetes mellitus) ([place])   ? Glaucoma   ? HTN (hypertension)   ? Obesity   ? Osteoarthritis   ? ? ?Past Surgical History:  ?Procedure Laterality Date  ? CHOLECYSTECTOMY    ? CORONARY STENT PLACEMENT    ? x3  ? REFRACTIVE SURGERY    ? detached retina  ? TUBAL LIGATION    ? 76 yrs old  ? ? ?Social History  ? ?Socioeconomic History  ? Marital status: Divorced  ?  Spouse name: Not on file  ? Number of children: 2  ? Years of education: Not on file  ? Highest education level: Not on file  ?Occupational History  ? Occupation: retired  ?Tobacco Use  ? Smoking status: Every Day  ?  Packs/day: 1.00  ?  Types: Cigarettes  ? Smokeless tobacco: Not on file  ?Substance and Sexual Activity  ? Alcohol use: Yes  ?  Comment: rarely  ? Drug use: Not on file  ? Sexual activity: Not on file  ?Other Topics Concern  ? Not on file  ?Social History Narrative  ? Not on file  ? ?Social Determinants of Health   ? ?Financial Resource Strain: Not on file  ?Food Insecurity: Not on file  ?Transportation Needs: Not on file  ?Physical Activity: Not on file  ?Stress: Not on file  ?Social Connections: Not on file  ?Intimate Partner Violence: Not on file  ? ?Family History  ?Problem Relation Age of Onset  ? Stroke Father   ? Diabetes Sister   ? Hypertension Sister   ? Arthritis Other   ? Heart disease Other   ? Hypertension Other   ? Stroke Other   ? Kidney disease Other   ? Diabetes Other   ? ? ? ? ?VITAL SIGNS ?BP (!) 128/52   Pulse 80   Temp (!) 97.2 ?F (36.2 ?C)   Resp 20   Wt 143 lb 9.6 oz (65.1 kg)   SpO2 100%  ? ?Outpatient Encounter Medications as of 07/03/2021  ?Medication Sig  ? aspirin 81 MG tablet Take 81 mg by mouth daily.  ? atorvastatin (LIPITOR) 20 MG tablet Take 20 mg by mouth daily.  ? clobetasol cream (TEMOVATE) 0.05 % Apply to labia/vaginal area twice a week. ?Once A Day on Mon, Fri  ? dorzolamide-timolol (COSOPT) 22.3-6.8 MG/ML ophthalmic solution  Place 1 drop into both eyes daily. 1 drop both eyes; ophthalmic.   Wait at least 5 minutes between multiple drops in same eye.  ? ferrous sulfate 325 (65 FE) MG tablet Take 325 mg by mouth daily.  ? latanoprost (XALATAN) 0.005 % ophthalmic solution Place 1 drop into both eyes daily.  ? mirtazapine (REMERON) 15 MG tablet Take 15 mg by mouth daily.  ? NON FORMULARY Diet:Regular  ? PRESCRIPTION MEDICATION Endit Skin Protectant Ointment; topical ?Special Instructions: Apply to vaginal/perineal area bid. ?Twice A Day  ? ?No facility-administered encounter medications on file as of 07/03/2021.  ? ? ? ?SIGNIFICANT DIAGNOSTIC EXAMS ? ?TODAY ? ?05-21-21: hgb a1c 6.1; tsh 1.689; uric acid: 7.2  ?06-04-21: glucose 95; bun 35; creat 1.40; k+ 4.1; na++ 135; ca 8.4 protein 5.6; albumin 3.3 chol 107; ldl 50; trig 32; hdl 51 ?06-06-21: iron 31; tibc 249; vitamin D 51.87 ?06-07-21: wbc 5.0; hgb 8.6; hct 26.9; mcv 191.5 plt 175  ? ?Review of Systems  ?Constitutional:  Negative for  malaise/fatigue.  ?Respiratory:  Negative for cough and shortness of breath.   ?Cardiovascular:  Negative for chest pain, palpitations and leg swelling.  ?Gastrointestinal:  Negative for abdominal pain, constipation and heartburn.  ?Musculoskeletal:  Negative for back pain, joint pain and myalgias.  ?Skin: Negative.   ?Neurological:  Negative for dizziness.  ?Psychiatric/Behavioral:  The patient is not nervous/anxious.   ? ?Physical Exam ?Constitutional:   ?   General: She is not in acute distress. ?   Appearance: She is well-developed. She is not diaphoretic.  ?Neck:  ?   Thyroid: No thyromegaly.  ?Cardiovascular:  ?   Rate and Rhythm: Normal rate and regular rhythm.  ?   Pulses: Normal pulses.  ?   Heart sounds: Normal heart sounds.  ?Pulmonary:  ?   Effort: Pulmonary effort is normal. No respiratory distress.  ?   Breath sounds: Normal breath sounds.  ?Abdominal:  ?   General: Bowel sounds are normal. There is no distension.  ?   Palpations: Abdomen is soft.  ?   Tenderness: There is no abdominal tenderness.  ?Musculoskeletal:     ?   General: Normal range of motion.  ?   Cervical back: Neck supple.  ?   Right lower leg: No edema.  ?   Left lower leg: No edema.  ?Lymphadenopathy:  ?   Cervical: No cervical adenopathy.  ?Skin: ?   General: Skin is warm and dry.  ?Neurological:  ?   Mental Status: She is alert. Mental status is at baseline.  ?Psychiatric:     ?   Mood and Affect: Mood normal.  ? ? ? ?ASSESSMENT/ PLAN: ? ?TODAY ? ?Controlled type 2 diabetes mellitus with stage 3 chronic kidney disease without long term current use of insulin: hgb a1c 6.1 is on asa; statin ? ?2. Stage 3 chronic kidney disease due to type 2 diabetes mellitus: bun 35; creat 1.40; GFR 33 ? ?3. Hyperlipidemia associated with type 2 diabetes mellitus: ldl 50 will continue lipitor 20 mg daily  ? ?PREVIOUS  ? ?4. Anemia due to stage 3b chronic kidney disease: will continue iron daily  ? ?5. Increased intraocular pressure bilateral: will  continue cosopt both eyes daily; xalatan to both eyes daily  ? ?6. Dementia without behavioral disturbance; weight is 143 pounds.  ? ?7. Protein calorie malnutrition: protein 5.6; albumin 3.3  ? ?8. Chronic depression: will continue remeron 15 mg nightly  ? ?9. Benign hypertension  with CKD (chronic kidney disease) stage III: b/p 128/52  ? ? ? ? ? ? ? ? ?Ok Edwards NP ?[facility]  ? call [phone]  ? ?

## 2021-07-08 ENCOUNTER — Other Ambulatory Visit (HOSPITAL_COMMUNITY)
Admission: RE | Admit: 2021-07-08 | Discharge: 2021-07-08 | Disposition: A | Discharge status: Home / Self Care | Payer: Medicare Other | Source: Skilled Nursing Facility | Attending: Adult Health | Admitting: Adult Health

## 2021-07-08 DIAGNOSIS — E119 Type 2 diabetes mellitus without complications: Secondary | ICD-10-CM | POA: Diagnosis not present

## 2021-07-08 LAB — HEPATITIS C ANTIBODY: HCV Ab: NONREACTIVE

## 2021-07-10 LAB — MICROALBUMIN, URINE: Microalb, Ur: 54.5 ug/mL — ABNORMAL HIGH

## 2021-07-12 DIAGNOSIS — Z23 Encounter for immunization: Secondary | ICD-10-CM | POA: Diagnosis not present

## 2021-07-25 DIAGNOSIS — M85841 Other specified disorders of bone density and structure, right hand: Secondary | ICD-10-CM | POA: Diagnosis not present

## 2021-07-29 ENCOUNTER — Encounter: Payer: Self-pay | Admitting: Adult Health

## 2021-07-29 ENCOUNTER — Non-Acute Institutional Stay (SKILLED_NURSING_FACILITY): Payer: Medicare Other | Admitting: Adult Health

## 2021-07-29 DIAGNOSIS — M810 Age-related osteoporosis without current pathological fracture: Secondary | ICD-10-CM | POA: Diagnosis not present

## 2021-07-29 NOTE — Progress Notes (Signed)
Location:  [facility] Room Number: 101-D Place of Service:  SNF (31)   CODE STATUS: DNR  No Known Allergies  Chief Complaint  Patient presents with   Acute Visit    Osteoporosis     HPI:  She had a dexa scan done on 07-25-21. There are no reports of recent falls. No reports of pain or discomfort   Past Medical History:  Diagnosis Date   CAD (coronary artery disease)    DM (diabetes mellitus) (HCC)    Glaucoma    HTN (hypertension)    Obesity    Osteoarthritis     Past Surgical History:  Procedure Laterality Date   CHOLECYSTECTOMY     CORONARY STENT PLACEMENT     x3   REFRACTIVE SURGERY     detached retina   TUBAL LIGATION     76 yrs old    Social History   Socioeconomic History   Marital status: Divorced    Spouse name: Not on file   Number of children: 2   Years of education: Not on file   Highest education level: Not on file  Occupational History   Occupation: retired  Tobacco Use   Smoking status: Every Day    Packs/day: 1.00    Types: Cigarettes   Smokeless tobacco: Not on file  Substance and Sexual Activity   Alcohol use: Yes    Comment: rarely   Drug use: Not on file   Sexual activity: Not on file  Other Topics Concern   Not on file  Social History Narrative   Not on file   Social Determinants of Health   Financial Resource Strain: Not on file  Food Insecurity: Not on file  Transportation Needs: Not on file  Physical Activity: Not on file  Stress: Not on file  Social Connections: Not on file  Intimate Partner Violence: Not on file   Family History  Problem Relation Age of Onset   Stroke Father    Diabetes Sister    Hypertension Sister    Arthritis Other    Heart disease Other    Hypertension Other    Stroke Other    Kidney disease Other    Diabetes Other       VITAL SIGNS BP (!) 128/52   Pulse 80   Temp 98.1 F (36.7 C)   Resp 20   Wt 140 lb 3.2 oz (63.6 kg)   SpO2 100%   Outpatient  Encounter Medications as of 07/29/2021  Medication Sig   aspirin 81 MG tablet Take 81 mg by mouth daily.   atorvastatin (LIPITOR) 20 MG tablet Take 20 mg by mouth daily.   clobetasol cream (TEMOVATE) 0.05 % Apply to labia/vaginal area twice a week. Once A Day on Mon, Fri   dorzolamide-timolol (COSOPT) 22.3-6.8 MG/ML ophthalmic solution Place 1 drop into both eyes daily. 1 drop both eyes; ophthalmic.   Wait at least 5 minutes between multiple drops in same eye.   ferrous sulfate 325 (65 FE) MG tablet Take 325 mg by mouth daily.   latanoprost (XALATAN) 0.005 % ophthalmic solution Place 1 drop into both eyes daily.   mirtazapine (REMERON) 15 MG tablet Take 15 mg by mouth daily.   NON FORMULARY Diet:Regular   PRESCRIPTION MEDICATION Endit Skin Protectant Ointment; topical Special Instructions: Apply to vaginal/perineal area bid. Twice A Day   No facility-administered encounter medications on file as of 07/29/2021.     SIGNIFICANT DIAGNOSTIC EXAMS  TODAY  07-25-21:  dexa: t score: -2.521  PREVIOUS   05-21-21: hgb a1c 6.1; tsh 1.689; uric acid: 7.2  06-04-21: glucose 95; bun 35; creat 1.40; k+ 4.1; na++ 135; ca 8.4 protein 5.6; albumin 3.3 chol 107; ldl 50; trig 32; hdl 51 06-06-21: iron 31; tibc 249; vitamin D 51.87 06-07-21: wbc 5.0; hgb 8.6; hct 26.9; mcv 191.5 plt 175   NO NEW LABS.   Review of Systems  Constitutional:  Negative for malaise/fatigue.  Respiratory:  Negative for cough and shortness of breath.   Cardiovascular:  Negative for chest pain, palpitations and leg swelling.  Gastrointestinal:  Negative for abdominal pain, constipation and heartburn.  Musculoskeletal:  Negative for back pain, joint pain and myalgias.  Skin: Negative.   Neurological:  Negative for dizziness.  Psychiatric/Behavioral:  The patient is not nervous/anxious.    Physical Exam Constitutional:      General: She is not in acute distress.    Appearance: She is well-developed. She is not diaphoretic.   Neck:     Thyroid: No thyromegaly.  Cardiovascular:     Rate and Rhythm: Normal rate and regular rhythm.     Pulses: Normal pulses.     Heart sounds: Normal heart sounds.  Pulmonary:     Effort: Pulmonary effort is normal. No respiratory distress.     Breath sounds: Normal breath sounds.  Abdominal:     General: Bowel sounds are normal. There is no distension.     Palpations: Abdomen is soft.     Tenderness: There is no abdominal tenderness.  Musculoskeletal:        General: Normal range of motion.     Cervical back: Neck supple.     Right lower leg: No edema.     Left lower leg: No edema.  Lymphadenopathy:     Cervical: No cervical adenopathy.  Skin:    General: Skin is warm and dry.  Neurological:     Mental Status: She is alert. Mental status is at baseline.  Psychiatric:        Mood and Affect: Mood normal.     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

## 2021-07-31 ENCOUNTER — Encounter: Payer: Self-pay | Admitting: Adult Health

## 2021-07-31 ENCOUNTER — Non-Acute Institutional Stay (SKILLED_NURSING_FACILITY): Payer: Medicare Other | Admitting: Adult Health

## 2021-07-31 DIAGNOSIS — D631 Anemia in chronic kidney disease: Secondary | ICD-10-CM

## 2021-07-31 DIAGNOSIS — F0390 Unspecified dementia without behavioral disturbance: Secondary | ICD-10-CM | POA: Diagnosis not present

## 2021-07-31 DIAGNOSIS — N1832 Chronic kidney disease, stage 3b: Secondary | ICD-10-CM

## 2021-07-31 DIAGNOSIS — H40053 Ocular hypertension, bilateral: Secondary | ICD-10-CM | POA: Diagnosis not present

## 2021-07-31 NOTE — Progress Notes (Unsigned)
Location:  [facility] Room Number: 101D Place of Service:  SNF (31) Provider: Synthia Innocent, NP   CODE STATUS: DNR  No Known Allergies  Chief Complaint  Patient presents with   Medical Management of Chronic Issues    Routine follow up   Immunizations    3rd COVID booster   Health Maintenance    Eye exam,dexa scan    HPI:    Past Medical History:  Diagnosis Date   CAD (coronary artery disease)    DM (diabetes mellitus) (HCC)    Glaucoma    HTN (hypertension)    Obesity    Osteoarthritis     Past Surgical History:  Procedure Laterality Date   CHOLECYSTECTOMY     CORONARY STENT PLACEMENT     x3   REFRACTIVE SURGERY     detached retina   TUBAL LIGATION     76 yrs old    Social History   Socioeconomic History   Marital status: Divorced    Spouse name: Not on file   Number of children: 2   Years of education: Not on file   Highest education level: Not on file  Occupational History   Occupation: retired  Tobacco Use   Smoking status: Every Day    Packs/day: 1.00    Types: Cigarettes   Smokeless tobacco: Not on file  Substance and Sexual Activity   Alcohol use: Yes    Comment: rarely   Drug use: Not on file   Sexual activity: Not on file  Other Topics Concern   Not on file  Social History Narrative   Not on file   Social Determinants of Health   Financial Resource Strain: Not on file  Food Insecurity: Not on file  Transportation Needs: Not on file  Physical Activity: Not on file  Stress: Not on file  Social Connections: Not on file  Intimate Partner Violence: Not on file   Family History  Problem Relation Age of Onset   Stroke Father    Diabetes Sister    Hypertension Sister    Arthritis Other    Heart disease Other    Hypertension Other    Stroke Other    Kidney disease Other    Diabetes Other       VITAL SIGNS There were no vitals taken for this visit.  Outpatient Encounter Medications as of 07/31/2021   Medication Sig   alendronate (FOSAMAX) 70 MG tablet Take 70 mg by mouth once a week. Take with a full glass of water on an empty stomach.   aspirin 81 MG tablet Take 81 mg by mouth daily.   atorvastatin (LIPITOR) 20 MG tablet Take 20 mg by mouth daily.   cholecalciferol (VITAMIN D3) 25 MCG (1000 UNIT) tablet Take 1,000 Units by mouth daily.   clobetasol cream (TEMOVATE) 0.05 % Apply to labia/vaginal area twice a week. Once A Day on Mon, Fri   dorzolamide-timolol (COSOPT) 22.3-6.8 MG/ML ophthalmic solution Place 1 drop into both eyes daily. 1 drop both eyes; ophthalmic.   Wait at least 5 minutes between multiple drops in same eye.   ferrous sulfate 325 (65 FE) MG tablet Take 325 mg by mouth daily.   latanoprost (XALATAN) 0.005 % ophthalmic solution Place 1 drop into both eyes daily.   mirtazapine (REMERON) 15 MG tablet Take 15 mg by mouth daily.   NON FORMULARY Diet:Regular   PRESCRIPTION MEDICATION Endit Skin Protectant Ointment; topical Special Instructions: Apply to vaginal/perineal area bid. Twice  A Day   No facility-administered encounter medications on file as of 07/31/2021.     SIGNIFICANT DIAGNOSTIC EXAMS       ASSESSMENT/ PLAN:     Synthia Innocent NP [facility]  Contact [phone] Monday through Friday 8am- 5pm  After hours call [phone]

## 2021-08-01 ENCOUNTER — Encounter: Payer: Self-pay | Admitting: Adult Health

## 2021-08-05 DIAGNOSIS — M25562 Pain in left knee: Secondary | ICD-10-CM | POA: Diagnosis not present

## 2021-08-05 DIAGNOSIS — Z96652 Presence of left artificial knee joint: Secondary | ICD-10-CM | POA: Diagnosis not present

## 2021-08-05 DIAGNOSIS — M1711 Unilateral primary osteoarthritis, right knee: Secondary | ICD-10-CM | POA: Diagnosis not present

## 2021-08-05 DIAGNOSIS — M25561 Pain in right knee: Secondary | ICD-10-CM | POA: Diagnosis not present

## 2021-08-20 DIAGNOSIS — R278 Other lack of coordination: Secondary | ICD-10-CM | POA: Diagnosis not present

## 2021-08-20 DIAGNOSIS — Z741 Need for assistance with personal care: Secondary | ICD-10-CM | POA: Diagnosis not present

## 2021-08-20 DIAGNOSIS — M6281 Muscle weakness (generalized): Secondary | ICD-10-CM | POA: Diagnosis not present

## 2021-08-20 DIAGNOSIS — F0280 Dementia in other diseases classified elsewhere without behavioral disturbance: Secondary | ICD-10-CM | POA: Diagnosis not present

## 2021-08-20 DIAGNOSIS — M159 Polyosteoarthritis, unspecified: Secondary | ICD-10-CM | POA: Diagnosis not present

## 2021-08-20 DIAGNOSIS — Z9181 History of falling: Secondary | ICD-10-CM | POA: Diagnosis not present

## 2021-08-21 DIAGNOSIS — M6281 Muscle weakness (generalized): Secondary | ICD-10-CM | POA: Diagnosis not present

## 2021-08-21 DIAGNOSIS — M159 Polyosteoarthritis, unspecified: Secondary | ICD-10-CM | POA: Diagnosis not present

## 2021-08-21 DIAGNOSIS — Z9181 History of falling: Secondary | ICD-10-CM | POA: Diagnosis not present

## 2021-08-21 DIAGNOSIS — Z741 Need for assistance with personal care: Secondary | ICD-10-CM | POA: Diagnosis not present

## 2021-08-21 DIAGNOSIS — R278 Other lack of coordination: Secondary | ICD-10-CM | POA: Diagnosis not present

## 2021-08-21 DIAGNOSIS — F0280 Dementia in other diseases classified elsewhere without behavioral disturbance: Secondary | ICD-10-CM | POA: Diagnosis not present

## 2021-08-22 DIAGNOSIS — M6281 Muscle weakness (generalized): Secondary | ICD-10-CM | POA: Diagnosis not present

## 2021-08-22 DIAGNOSIS — Z9181 History of falling: Secondary | ICD-10-CM | POA: Diagnosis not present

## 2021-08-22 DIAGNOSIS — R278 Other lack of coordination: Secondary | ICD-10-CM | POA: Diagnosis not present

## 2021-08-22 DIAGNOSIS — Z741 Need for assistance with personal care: Secondary | ICD-10-CM | POA: Diagnosis not present

## 2021-08-22 DIAGNOSIS — F0280 Dementia in other diseases classified elsewhere without behavioral disturbance: Secondary | ICD-10-CM | POA: Diagnosis not present

## 2021-08-22 DIAGNOSIS — M159 Polyosteoarthritis, unspecified: Secondary | ICD-10-CM | POA: Diagnosis not present

## 2021-08-23 DIAGNOSIS — M6281 Muscle weakness (generalized): Secondary | ICD-10-CM | POA: Diagnosis not present

## 2021-08-23 DIAGNOSIS — F0280 Dementia in other diseases classified elsewhere without behavioral disturbance: Secondary | ICD-10-CM | POA: Diagnosis not present

## 2021-08-23 DIAGNOSIS — Z741 Need for assistance with personal care: Secondary | ICD-10-CM | POA: Diagnosis not present

## 2021-08-23 DIAGNOSIS — Z9181 History of falling: Secondary | ICD-10-CM | POA: Diagnosis not present

## 2021-08-23 DIAGNOSIS — R278 Other lack of coordination: Secondary | ICD-10-CM | POA: Diagnosis not present

## 2021-08-23 DIAGNOSIS — M159 Polyosteoarthritis, unspecified: Secondary | ICD-10-CM | POA: Diagnosis not present

## 2021-08-26 DIAGNOSIS — M159 Polyosteoarthritis, unspecified: Secondary | ICD-10-CM | POA: Diagnosis not present

## 2021-08-26 DIAGNOSIS — F0280 Dementia in other diseases classified elsewhere without behavioral disturbance: Secondary | ICD-10-CM | POA: Diagnosis not present

## 2021-08-26 DIAGNOSIS — Z9181 History of falling: Secondary | ICD-10-CM | POA: Diagnosis not present

## 2021-08-26 DIAGNOSIS — Z741 Need for assistance with personal care: Secondary | ICD-10-CM | POA: Diagnosis not present

## 2021-08-26 DIAGNOSIS — M6281 Muscle weakness (generalized): Secondary | ICD-10-CM | POA: Diagnosis not present

## 2021-08-26 DIAGNOSIS — R278 Other lack of coordination: Secondary | ICD-10-CM | POA: Diagnosis not present

## 2021-08-27 DIAGNOSIS — M6281 Muscle weakness (generalized): Secondary | ICD-10-CM | POA: Diagnosis not present

## 2021-08-27 DIAGNOSIS — F0280 Dementia in other diseases classified elsewhere without behavioral disturbance: Secondary | ICD-10-CM | POA: Diagnosis not present

## 2021-08-27 DIAGNOSIS — Z741 Need for assistance with personal care: Secondary | ICD-10-CM | POA: Diagnosis not present

## 2021-08-27 DIAGNOSIS — R278 Other lack of coordination: Secondary | ICD-10-CM | POA: Diagnosis not present

## 2021-08-27 DIAGNOSIS — Z9181 History of falling: Secondary | ICD-10-CM | POA: Diagnosis not present

## 2021-08-27 DIAGNOSIS — M159 Polyosteoarthritis, unspecified: Secondary | ICD-10-CM | POA: Diagnosis not present

## 2021-08-28 ENCOUNTER — Encounter: Payer: Self-pay | Admitting: Adult Health

## 2021-08-28 ENCOUNTER — Non-Acute Institutional Stay (SKILLED_NURSING_FACILITY): Payer: Medicare Other | Admitting: Adult Health

## 2021-08-28 DIAGNOSIS — M159 Polyosteoarthritis, unspecified: Secondary | ICD-10-CM | POA: Diagnosis not present

## 2021-08-28 DIAGNOSIS — F0280 Dementia in other diseases classified elsewhere without behavioral disturbance: Secondary | ICD-10-CM | POA: Diagnosis not present

## 2021-08-28 DIAGNOSIS — I129 Hypertensive chronic kidney disease with stage 1 through stage 4 chronic kidney disease, or unspecified chronic kidney disease: Secondary | ICD-10-CM | POA: Diagnosis not present

## 2021-08-28 DIAGNOSIS — E441 Mild protein-calorie malnutrition: Secondary | ICD-10-CM | POA: Diagnosis not present

## 2021-08-28 DIAGNOSIS — Z741 Need for assistance with personal care: Secondary | ICD-10-CM | POA: Diagnosis not present

## 2021-08-28 DIAGNOSIS — M6281 Muscle weakness (generalized): Secondary | ICD-10-CM | POA: Diagnosis not present

## 2021-08-28 DIAGNOSIS — N183 Chronic kidney disease, stage 3 unspecified: Secondary | ICD-10-CM

## 2021-08-28 DIAGNOSIS — F339 Major depressive disorder, recurrent, unspecified: Secondary | ICD-10-CM | POA: Diagnosis not present

## 2021-08-28 DIAGNOSIS — R278 Other lack of coordination: Secondary | ICD-10-CM | POA: Diagnosis not present

## 2021-08-28 DIAGNOSIS — Z9181 History of falling: Secondary | ICD-10-CM | POA: Diagnosis not present

## 2021-08-28 NOTE — Progress Notes (Signed)
Location:  [facility] Room Number: 101 Place of Service:  SNF (31)   CODE STATUS: dnr   No Known Allergies  Chief Complaint  Patient presents with   Medical Management of Chronic Issues                     Protein calorie malnutrition:  Chronic depression  Benign hypertension with CKD (chronic kidney disease) stage III:     HPI:  She is a 76 year old long term resident of this facility being seen for the management of her chronic illnesses:  Protein calorie malnutrition:  Chronic depression  Benign hypertension with CKD (chronic kidney disease) stage III. The staff has been unable to collect stool specimen for her cologuard. There are no reports of uncontrolled pain. There are no reports of depressive or anxious thoughts   Past Medical History:  Diagnosis Date   CAD (coronary artery disease)    DM (diabetes mellitus) (HCC)    Glaucoma    HTN (hypertension)    Obesity    Osteoarthritis     Past Surgical History:  Procedure Laterality Date   CHOLECYSTECTOMY     CORONARY STENT PLACEMENT     x3   REFRACTIVE SURGERY     detached retina   TUBAL LIGATION     76 yrs old    Social History   Socioeconomic History   Marital status: Divorced    Spouse name: Not on file   Number of children: 2   Years of education: Not on file   Highest education level: Not on file  Occupational History   Occupation: retired  Tobacco Use   Smoking status: Every Day    Packs/day: 1.00    Types: Cigarettes   Smokeless tobacco: Not on file  Substance and Sexual Activity   Alcohol use: Yes    Comment: rarely   Drug use: Not on file   Sexual activity: Not on file  Other Topics Concern   Not on file  Social History Narrative   Not on file   Social Determinants of Health   Financial Resource Strain: Not on file  Food Insecurity: Not on file  Transportation Needs: Not on file  Physical Activity: Not on file  Stress: Not on file  Social Connections: Not on  file  Intimate Partner Violence: Not on file   Family History  Problem Relation Age of Onset   Stroke Father    Diabetes Sister    Hypertension Sister    Arthritis Other    Heart disease Other    Hypertension Other    Stroke Other    Kidney disease Other    Diabetes Other       VITAL SIGNS BP (!) 128/52   Pulse 70   Temp (!) 97.5 F (36.4 C)   Resp 18   Wt 144 lb 12.8 oz (65.7 kg)   Outpatient Encounter Medications as of 08/28/2021  Medication Sig   alendronate (FOSAMAX) 70 MG tablet Take 70 mg by mouth once a week. Take with a full glass of water on an empty stomach.   aspirin 81 MG tablet Take 81 mg by mouth daily.   atorvastatin (LIPITOR) 20 MG tablet Take 20 mg by mouth daily.   cholecalciferol (VITAMIN D3) 25 MCG (1000 UNIT) tablet Take 1,000 Units by mouth daily.   clobetasol cream (TEMOVATE) 0.05 % Apply to labia/vaginal area twice a week. Once A Day on Mon, Fri  dorzolamide-timolol (COSOPT) 22.3-6.8 MG/ML ophthalmic solution Place 1 drop into both eyes daily. 1 drop both eyes; ophthalmic.   Wait at least 5 minutes between multiple drops in same eye.   ferrous sulfate 325 (65 FE) MG tablet Take 325 mg by mouth daily.   latanoprost (XALATAN) 0.005 % ophthalmic solution Place 1 drop into both eyes daily.   mirtazapine (REMERON) 15 MG tablet Take 15 mg by mouth daily.   NON FORMULARY Diet:Regular   PRESCRIPTION MEDICATION Endit Skin Protectant Ointment; topical Special Instructions: Apply to vaginal/perineal area bid. Twice A Day   No facility-administered encounter medications on file as of 08/28/2021.     SIGNIFICANT DIAGNOSTIC EXAMS  PREVIOUS   07-25-21: dexa: t score: -2.521  NO NEW EXAMS   PREVIOUS   05-21-21: hgb a1c 6.1; tsh 1.689; uric acid: 7.2  06-04-21: glucose 95; bun 35; creat 1.40; k+ 4.1; na++ 135; ca 8.4 protein 5.6; albumin 3.3 chol 107; ldl 50; trig 32; hdl 51 06-06-21: iron 31; tibc 249; vitamin D 51.87 06-07-21: wbc 5.0; hgb 8.6; hct 26.9;  mcv 191.5 plt 175  07-08-21: hepatitis C nr; urine micro-albumin  54.5  NO NEW LABS   Review of Systems  Constitutional:  Negative for malaise/fatigue.  Respiratory:  Negative for cough and shortness of breath.   Cardiovascular:  Negative for chest pain, palpitations and leg swelling.  Gastrointestinal:  Negative for abdominal pain, constipation and heartburn.  Musculoskeletal:  Negative for back pain, joint pain and myalgias.  Skin: Negative.   Neurological:  Negative for dizziness.  Psychiatric/Behavioral:  The patient is not nervous/anxious.      Physical Exam Constitutional:      General: She is not in acute distress.    Appearance: She is well-developed. She is not diaphoretic.  Neck:     Thyroid: No thyromegaly.  Cardiovascular:     Rate and Rhythm: Normal rate and regular rhythm.     Pulses: Normal pulses.     Heart sounds: Normal heart sounds.  Pulmonary:     Effort: Pulmonary effort is normal. No respiratory distress.     Breath sounds: Normal breath sounds.  Abdominal:     General: Bowel sounds are normal. There is no distension.     Palpations: Abdomen is soft.     Tenderness: There is no abdominal tenderness.  Musculoskeletal:        General: Normal range of motion.     Cervical back: Neck supple.     Right lower leg: No edema.     Left lower leg: No edema.  Lymphadenopathy:     Cervical: No cervical adenopathy.  Skin:    General: Skin is warm and dry.  Neurological:     Mental Status: She is alert. Mental status is at baseline.  Psychiatric:        Mood and Affect: Mood normal.      ASSESSMENT/ PLAN:  TODAY  Protein calorie malnutrition: protein 5.6 albumin 3.3 will continue supplements as directed  2. Chronic depression will continue remeron 15 mg nightly   3. Benign hypertension with CKD (chronic kidney disease) stage III: b/p 128/52   PREVIOUS    4. 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 Stage 3 chronic kidney disease due to  type 2 diabetes mellitus: bun 35; creat 1.40; GFR 33  7. Hyperlipidemia associated with type 2 diabetes mellitus: ldl 50 will continue lipitor 20 mg daily   8. Anemia due to stage 3b chronic kidney disease: will continue iron daily hgb 8.6  9. Increased intraocular pressure bilateral: will continue cosopt both eyes daily; xalatan to both eyes daily   10. Dementia without behavioral disturbance  weight is 144 pounds no significant change     Ok Edwards NP [facility]   call [phone]

## 2021-08-29 DIAGNOSIS — R278 Other lack of coordination: Secondary | ICD-10-CM | POA: Diagnosis not present

## 2021-08-29 DIAGNOSIS — M6281 Muscle weakness (generalized): Secondary | ICD-10-CM | POA: Diagnosis not present

## 2021-08-29 DIAGNOSIS — Z9181 History of falling: Secondary | ICD-10-CM | POA: Diagnosis not present

## 2021-08-29 DIAGNOSIS — F0280 Dementia in other diseases classified elsewhere without behavioral disturbance: Secondary | ICD-10-CM | POA: Diagnosis not present

## 2021-08-29 DIAGNOSIS — Z741 Need for assistance with personal care: Secondary | ICD-10-CM | POA: Diagnosis not present

## 2021-08-29 DIAGNOSIS — M159 Polyosteoarthritis, unspecified: Secondary | ICD-10-CM | POA: Diagnosis not present

## 2021-09-02 DIAGNOSIS — M2142 Flat foot [pes planus] (acquired), left foot: Secondary | ICD-10-CM | POA: Diagnosis not present

## 2021-09-02 DIAGNOSIS — M2141 Flat foot [pes planus] (acquired), right foot: Secondary | ICD-10-CM | POA: Diagnosis not present

## 2021-09-02 DIAGNOSIS — B351 Tinea unguium: Secondary | ICD-10-CM | POA: Diagnosis not present

## 2021-09-02 DIAGNOSIS — R278 Other lack of coordination: Secondary | ICD-10-CM | POA: Diagnosis not present

## 2021-09-02 DIAGNOSIS — M6281 Muscle weakness (generalized): Secondary | ICD-10-CM | POA: Diagnosis not present

## 2021-09-02 DIAGNOSIS — Z741 Need for assistance with personal care: Secondary | ICD-10-CM | POA: Diagnosis not present

## 2021-09-02 DIAGNOSIS — I739 Peripheral vascular disease, unspecified: Secondary | ICD-10-CM | POA: Diagnosis not present

## 2021-09-02 DIAGNOSIS — F0280 Dementia in other diseases classified elsewhere without behavioral disturbance: Secondary | ICD-10-CM | POA: Diagnosis not present

## 2021-09-02 DIAGNOSIS — M159 Polyosteoarthritis, unspecified: Secondary | ICD-10-CM | POA: Diagnosis not present

## 2021-09-02 DIAGNOSIS — Z9181 History of falling: Secondary | ICD-10-CM | POA: Diagnosis not present

## 2021-09-03 DIAGNOSIS — F0280 Dementia in other diseases classified elsewhere without behavioral disturbance: Secondary | ICD-10-CM | POA: Diagnosis not present

## 2021-09-03 DIAGNOSIS — Z9181 History of falling: Secondary | ICD-10-CM | POA: Diagnosis not present

## 2021-09-03 DIAGNOSIS — M159 Polyosteoarthritis, unspecified: Secondary | ICD-10-CM | POA: Diagnosis not present

## 2021-09-03 DIAGNOSIS — M6281 Muscle weakness (generalized): Secondary | ICD-10-CM | POA: Diagnosis not present

## 2021-09-03 DIAGNOSIS — Z741 Need for assistance with personal care: Secondary | ICD-10-CM | POA: Diagnosis not present

## 2021-09-03 DIAGNOSIS — R278 Other lack of coordination: Secondary | ICD-10-CM | POA: Diagnosis not present

## 2021-09-04 DIAGNOSIS — M6281 Muscle weakness (generalized): Secondary | ICD-10-CM | POA: Diagnosis not present

## 2021-09-04 DIAGNOSIS — M159 Polyosteoarthritis, unspecified: Secondary | ICD-10-CM | POA: Diagnosis not present

## 2021-09-04 DIAGNOSIS — Z741 Need for assistance with personal care: Secondary | ICD-10-CM | POA: Diagnosis not present

## 2021-09-04 DIAGNOSIS — R278 Other lack of coordination: Secondary | ICD-10-CM | POA: Diagnosis not present

## 2021-09-04 DIAGNOSIS — Z9181 History of falling: Secondary | ICD-10-CM | POA: Diagnosis not present

## 2021-09-04 DIAGNOSIS — F0280 Dementia in other diseases classified elsewhere without behavioral disturbance: Secondary | ICD-10-CM | POA: Diagnosis not present

## 2021-09-05 DIAGNOSIS — F0280 Dementia in other diseases classified elsewhere without behavioral disturbance: Secondary | ICD-10-CM | POA: Diagnosis not present

## 2021-09-05 DIAGNOSIS — M6281 Muscle weakness (generalized): Secondary | ICD-10-CM | POA: Diagnosis not present

## 2021-09-05 DIAGNOSIS — M159 Polyosteoarthritis, unspecified: Secondary | ICD-10-CM | POA: Diagnosis not present

## 2021-09-05 DIAGNOSIS — Z9181 History of falling: Secondary | ICD-10-CM | POA: Diagnosis not present

## 2021-09-05 DIAGNOSIS — Z741 Need for assistance with personal care: Secondary | ICD-10-CM | POA: Diagnosis not present

## 2021-09-05 DIAGNOSIS — R278 Other lack of coordination: Secondary | ICD-10-CM | POA: Diagnosis not present

## 2021-09-06 DIAGNOSIS — Z741 Need for assistance with personal care: Secondary | ICD-10-CM | POA: Diagnosis not present

## 2021-09-06 DIAGNOSIS — R278 Other lack of coordination: Secondary | ICD-10-CM | POA: Diagnosis not present

## 2021-09-06 DIAGNOSIS — Z9181 History of falling: Secondary | ICD-10-CM | POA: Diagnosis not present

## 2021-09-06 DIAGNOSIS — M159 Polyosteoarthritis, unspecified: Secondary | ICD-10-CM | POA: Diagnosis not present

## 2021-09-06 DIAGNOSIS — M6281 Muscle weakness (generalized): Secondary | ICD-10-CM | POA: Diagnosis not present

## 2021-09-06 DIAGNOSIS — F0280 Dementia in other diseases classified elsewhere without behavioral disturbance: Secondary | ICD-10-CM | POA: Diagnosis not present

## 2021-09-09 DIAGNOSIS — F0280 Dementia in other diseases classified elsewhere without behavioral disturbance: Secondary | ICD-10-CM | POA: Diagnosis not present

## 2021-09-09 DIAGNOSIS — Z9181 History of falling: Secondary | ICD-10-CM | POA: Diagnosis not present

## 2021-09-09 DIAGNOSIS — M6281 Muscle weakness (generalized): Secondary | ICD-10-CM | POA: Diagnosis not present

## 2021-09-09 DIAGNOSIS — Z741 Need for assistance with personal care: Secondary | ICD-10-CM | POA: Diagnosis not present

## 2021-09-09 DIAGNOSIS — M159 Polyosteoarthritis, unspecified: Secondary | ICD-10-CM | POA: Diagnosis not present

## 2021-09-09 DIAGNOSIS — R278 Other lack of coordination: Secondary | ICD-10-CM | POA: Diagnosis not present

## 2021-09-10 DIAGNOSIS — Z741 Need for assistance with personal care: Secondary | ICD-10-CM | POA: Diagnosis not present

## 2021-09-10 DIAGNOSIS — F0280 Dementia in other diseases classified elsewhere without behavioral disturbance: Secondary | ICD-10-CM | POA: Diagnosis not present

## 2021-09-10 DIAGNOSIS — R278 Other lack of coordination: Secondary | ICD-10-CM | POA: Diagnosis not present

## 2021-09-10 DIAGNOSIS — M6281 Muscle weakness (generalized): Secondary | ICD-10-CM | POA: Diagnosis not present

## 2021-09-10 DIAGNOSIS — Z9181 History of falling: Secondary | ICD-10-CM | POA: Diagnosis not present

## 2021-09-10 DIAGNOSIS — M159 Polyosteoarthritis, unspecified: Secondary | ICD-10-CM | POA: Diagnosis not present

## 2021-09-11 DIAGNOSIS — F0280 Dementia in other diseases classified elsewhere without behavioral disturbance: Secondary | ICD-10-CM | POA: Diagnosis not present

## 2021-09-11 DIAGNOSIS — M159 Polyosteoarthritis, unspecified: Secondary | ICD-10-CM | POA: Diagnosis not present

## 2021-09-11 DIAGNOSIS — M6281 Muscle weakness (generalized): Secondary | ICD-10-CM | POA: Diagnosis not present

## 2021-09-11 DIAGNOSIS — Z741 Need for assistance with personal care: Secondary | ICD-10-CM | POA: Diagnosis not present

## 2021-09-11 DIAGNOSIS — R278 Other lack of coordination: Secondary | ICD-10-CM | POA: Diagnosis not present

## 2021-09-11 DIAGNOSIS — Z9181 History of falling: Secondary | ICD-10-CM | POA: Diagnosis not present

## 2021-09-12 DIAGNOSIS — R278 Other lack of coordination: Secondary | ICD-10-CM | POA: Diagnosis not present

## 2021-09-12 DIAGNOSIS — F0280 Dementia in other diseases classified elsewhere without behavioral disturbance: Secondary | ICD-10-CM | POA: Diagnosis not present

## 2021-09-12 DIAGNOSIS — Z741 Need for assistance with personal care: Secondary | ICD-10-CM | POA: Diagnosis not present

## 2021-09-12 DIAGNOSIS — M6281 Muscle weakness (generalized): Secondary | ICD-10-CM | POA: Diagnosis not present

## 2021-09-12 DIAGNOSIS — Z9181 History of falling: Secondary | ICD-10-CM | POA: Diagnosis not present

## 2021-09-12 DIAGNOSIS — M159 Polyosteoarthritis, unspecified: Secondary | ICD-10-CM | POA: Diagnosis not present

## 2021-09-13 DIAGNOSIS — M159 Polyosteoarthritis, unspecified: Secondary | ICD-10-CM | POA: Diagnosis not present

## 2021-09-13 DIAGNOSIS — R278 Other lack of coordination: Secondary | ICD-10-CM | POA: Diagnosis not present

## 2021-09-13 DIAGNOSIS — Z741 Need for assistance with personal care: Secondary | ICD-10-CM | POA: Diagnosis not present

## 2021-09-13 DIAGNOSIS — F0280 Dementia in other diseases classified elsewhere without behavioral disturbance: Secondary | ICD-10-CM | POA: Diagnosis not present

## 2021-09-13 DIAGNOSIS — M6281 Muscle weakness (generalized): Secondary | ICD-10-CM | POA: Diagnosis not present

## 2021-09-13 DIAGNOSIS — Z9181 History of falling: Secondary | ICD-10-CM | POA: Diagnosis not present

## 2021-09-16 DIAGNOSIS — F0280 Dementia in other diseases classified elsewhere without behavioral disturbance: Secondary | ICD-10-CM | POA: Diagnosis not present

## 2021-09-16 DIAGNOSIS — Z9181 History of falling: Secondary | ICD-10-CM | POA: Diagnosis not present

## 2021-09-16 DIAGNOSIS — Z741 Need for assistance with personal care: Secondary | ICD-10-CM | POA: Diagnosis not present

## 2021-09-16 DIAGNOSIS — R278 Other lack of coordination: Secondary | ICD-10-CM | POA: Diagnosis not present

## 2021-09-16 DIAGNOSIS — M159 Polyosteoarthritis, unspecified: Secondary | ICD-10-CM | POA: Diagnosis not present

## 2021-09-16 DIAGNOSIS — M6281 Muscle weakness (generalized): Secondary | ICD-10-CM | POA: Diagnosis not present

## 2021-09-17 DIAGNOSIS — R278 Other lack of coordination: Secondary | ICD-10-CM | POA: Diagnosis not present

## 2021-09-17 DIAGNOSIS — Z741 Need for assistance with personal care: Secondary | ICD-10-CM | POA: Diagnosis not present

## 2021-09-17 DIAGNOSIS — Z9181 History of falling: Secondary | ICD-10-CM | POA: Diagnosis not present

## 2021-09-17 DIAGNOSIS — M6281 Muscle weakness (generalized): Secondary | ICD-10-CM | POA: Diagnosis not present

## 2021-09-17 DIAGNOSIS — M159 Polyosteoarthritis, unspecified: Secondary | ICD-10-CM | POA: Diagnosis not present

## 2021-09-17 DIAGNOSIS — F0280 Dementia in other diseases classified elsewhere without behavioral disturbance: Secondary | ICD-10-CM | POA: Diagnosis not present

## 2021-09-19 ENCOUNTER — Non-Acute Institutional Stay (SKILLED_NURSING_FACILITY): Payer: Medicare Other | Admitting: Adult Health

## 2021-09-19 ENCOUNTER — Encounter: Payer: Self-pay | Admitting: Adult Health

## 2021-09-19 DIAGNOSIS — F0390 Unspecified dementia without behavioral disturbance: Secondary | ICD-10-CM

## 2021-09-19 NOTE — Progress Notes (Signed)
Location:  [facility] Room Number: 101-D Place of Service:  SNF (31)   CODE STATUS: DNR  No Known Allergies  Chief Complaint  Patient presents with   Acute Visit    Falls    HPI:  She has had 4 recent falls all without injury. She is presently taking remeron; this medication has been reduced to 7.5 mg nightly. It is questionable that this medication could contribute to her falls. She denies any pain. She does have a history of dementia and osteoporosis   Past Medical History:  Diagnosis Date   CAD (coronary artery disease)    DM (diabetes mellitus) (HCC)    Glaucoma    HTN (hypertension)    Obesity    Osteoarthritis     Past Surgical History:  Procedure Laterality Date   CHOLECYSTECTOMY     CORONARY STENT PLACEMENT     x3   REFRACTIVE SURGERY     detached retina   TUBAL LIGATION     76 yrs old    Social History   Socioeconomic History   Marital status: Divorced    Spouse name: Not on file   Number of children: 2   Years of education: Not on file   Highest education level: Not on file  Occupational History   Occupation: retired  Tobacco Use   Smoking status: Every Day    Packs/day: 1.00    Types: Cigarettes   Smokeless tobacco: Not on file  Substance and Sexual Activity   Alcohol use: Yes    Comment: rarely   Drug use: Not on file   Sexual activity: Not on file  Other Topics Concern   Not on file  Social History Narrative   Not on file   Social Determinants of Health   Financial Resource Strain: Not on file  Food Insecurity: Not on file  Transportation Needs: Not on file  Physical Activity: Not on file  Stress: Not on file  Social Connections: Not on file  Intimate Partner Violence: Not on file   Family History  Problem Relation Age of Onset   Stroke Father    Diabetes Sister    Hypertension Sister    Arthritis Other    Heart disease Other    Hypertension Other    Stroke Other    Kidney disease Other     Diabetes Other       VITAL SIGNS BP 128/74   Pulse 74   Temp 98.6 F (37 C)   Resp (!) 22   Wt 149 lb 9.6 oz (67.9 kg)   SpO2 97%   Outpatient Encounter Medications as of 09/19/2021  Medication Sig   alendronate (FOSAMAX) 70 MG tablet Take 70 mg by mouth once a week. Take with a full glass of water on an empty stomach.   aspirin 81 MG tablet Take 81 mg by mouth daily.   atorvastatin (LIPITOR) 20 MG tablet Take 20 mg by mouth daily.   cholecalciferol (VITAMIN D3) 25 MCG (1000 UNIT) tablet Take 1,000 Units by mouth daily.   clobetasol cream (TEMOVATE) 0.05 % Apply to labia/vaginal area twice a week. Once A Day on Mon, Fri   dorzolamide-timolol (COSOPT) 22.3-6.8 MG/ML ophthalmic solution Place 1 drop into both eyes daily. 1 drop both eyes; ophthalmic.   Wait at least 5 minutes between multiple drops in same eye.   ferrous sulfate 325 (65 FE) MG tablet Take 325 mg by mouth daily.   latanoprost (XALATAN) 0.005 % ophthalmic  solution Place 1 drop into both eyes daily.   mirtazapine (REMERON) 7.5 MG tablet Take 7.5 mg by mouth at bedtime. Depression   NON FORMULARY Diet:Regular   PRESCRIPTION MEDICATION Endit Skin Protectant Ointment; topical Special Instructions: Apply to vaginal/perineal area bid. Twice A Day   [DISCONTINUED] mirtazapine (REMERON) 15 MG tablet Take 15 mg by mouth daily.   No facility-administered encounter medications on file as of 09/19/2021.     SIGNIFICANT DIAGNOSTIC EXAMS   PREVIOUS   07-25-21: dexa: t score: -2.521  NO NEW EXAMS   PREVIOUS   05-21-21: hgb a1c 6.1; tsh 1.689; uric acid: 7.2  06-04-21: glucose 95; bun 35; creat 1.40; k+ 4.1; na++ 135; ca 8.4 protein 5.6; albumin 3.3 chol 107; ldl 50; trig 32; hdl 51 06-06-21: iron 31; tibc 249; vitamin D 51.87 06-07-21: wbc 5.0; hgb 8.6; hct 26.9; mcv 191.5 plt 175  07-08-21: hepatitis C nr; urine micro-albumin  54.5  NO NEW LABS   Review of Systems  Constitutional:  Negative for malaise/fatigue.   Respiratory:  Negative for cough and shortness of breath.   Cardiovascular:  Negative for chest pain, palpitations and leg swelling.  Gastrointestinal:  Negative for abdominal pain, constipation and heartburn.  Musculoskeletal:  Negative for back pain, joint pain and myalgias.  Skin: Negative.   Neurological:  Negative for dizziness.  Psychiatric/Behavioral:  The patient is not nervous/anxious.    Physical Exam Constitutional:      General: She is not in acute distress.    Appearance: She is well-developed. She is not diaphoretic.  Neck:     Thyroid: No thyromegaly.  Cardiovascular:     Rate and Rhythm: Normal rate and regular rhythm.     Pulses: Normal pulses.     Heart sounds: Normal heart sounds.  Pulmonary:     Effort: Pulmonary effort is normal. No respiratory distress.     Breath sounds: Normal breath sounds.  Abdominal:     General: Bowel sounds are normal. There is no distension.     Palpations: Abdomen is soft.     Tenderness: There is no abdominal tenderness.  Musculoskeletal:        General: Normal range of motion.     Cervical back: Neck supple.     Right lower leg: No edema.     Left lower leg: No edema.  Lymphadenopathy:     Cervical: No cervical adenopathy.  Skin:    General: Skin is warm and dry.  Neurological:     Mental Status: She is alert. Mental status is at baseline.  Psychiatric:        Mood and Affect: Mood normal.       ASSESSMENT/ PLAN:  TODAY  Dementia without behavioral disturbance: will complete remeron 7.5 and will stop this medication on 09-22-21.    Synthia Innocent NP [facility] Adult [facility]   call [phone]

## 2021-09-20 ENCOUNTER — Non-Acute Institutional Stay (SKILLED_NURSING_FACILITY): Payer: Medicare Other | Admitting: Adult Health

## 2021-09-20 ENCOUNTER — Encounter: Payer: Self-pay | Admitting: Adult Health

## 2021-09-20 DIAGNOSIS — N183 Chronic kidney disease, stage 3 unspecified: Secondary | ICD-10-CM

## 2021-09-20 DIAGNOSIS — I129 Hypertensive chronic kidney disease with stage 1 through stage 4 chronic kidney disease, or unspecified chronic kidney disease: Secondary | ICD-10-CM

## 2021-09-20 DIAGNOSIS — F339 Major depressive disorder, recurrent, unspecified: Secondary | ICD-10-CM | POA: Diagnosis not present

## 2021-09-20 DIAGNOSIS — E1122 Type 2 diabetes mellitus with diabetic chronic kidney disease: Secondary | ICD-10-CM

## 2021-09-20 DIAGNOSIS — F0390 Unspecified dementia without behavioral disturbance: Secondary | ICD-10-CM

## 2021-09-20 NOTE — Progress Notes (Signed)
Location:  [facility] Room Number: 101-D Place of Service:  SNF (31)   CODE STATUS: DNR  No Known Allergies  Chief Complaint  Patient presents with   Acute Visit    Care plan meeting    HPI:  We have come together for her care plan meeting. Family present. BIMS 5/15 mood: 4/30: decreased energy; nervous; some depression. She does pack her clothes at times. She is nonambulatory propels self in wheelchair. Has had four falls without injury present. She requires extensive assist with her alds. She is frequently incontinent of bladder and incontinent of bowel. Dietary: weight is 144.4 pounds; regular diet; 75-100% of meals; up 10 pounds since March. Therapy none at this time. 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   Dementia without behavioral disturbance   Depression recurrent  Past Medical History:  Diagnosis Date   CAD (coronary artery disease)    DM (diabetes mellitus) (HCC)    Glaucoma    HTN (hypertension)    Obesity    Osteoarthritis     Past Surgical History:  Procedure Laterality Date   CHOLECYSTECTOMY     CORONARY STENT PLACEMENT     x3   REFRACTIVE SURGERY     detached retina   TUBAL LIGATION     76 yrs old    Social History   Socioeconomic History   Marital status: Divorced    Spouse name: Not on file   Number of children: 2   Years of education: Not on file   Highest education level: Not on file  Occupational History   Occupation: retired  Tobacco Use   Smoking status: Every Day    Packs/day: 1.00    Types: Cigarettes   Smokeless tobacco: Not on file  Substance and Sexual Activity   Alcohol use: Yes    Comment: rarely   Drug use: Not on file   Sexual activity: Not on file  Other Topics Concern   Not on file  Social History Narrative   Not on file   Social  Determinants of Health   Financial Resource Strain: Not on file  Food Insecurity: Not on file  Transportation Needs: Not on file  Physical Activity: Not on file  Stress: Not on file  Social Connections: Not on file  Intimate Partner Violence: Not on file   Family History  Problem Relation Age of Onset   Stroke Father    Diabetes Sister    Hypertension Sister    Arthritis Other    Heart disease Other    Hypertension Other    Stroke Other    Kidney disease Other    Diabetes Other       VITAL SIGNS BP 128/74   Pulse 74   Temp 98.6 F (37 C)   Resp (!) 22   Wt 149 lb 9.6 oz (67.9 kg)   SpO2 97%   Outpatient Encounter Medications as of 09/20/2021  Medication Sig   alendronate (FOSAMAX) 70 MG tablet Take 70 mg by mouth once a week. Take with a full glass of water on an empty stomach.   aspirin 81 MG tablet Take 81 mg by mouth daily.   atorvastatin (LIPITOR) 20 MG tablet Take 20 mg by mouth daily.   cholecalciferol (VITAMIN D3) 25 MCG (1000 UNIT) tablet Take 1,000 Units by mouth daily.  clobetasol cream (TEMOVATE) 0.05 % Apply to labia/vaginal area twice a week. Once A Day on Mon, Fri   dorzolamide-timolol (COSOPT) 22.3-6.8 MG/ML ophthalmic solution Place 1 drop into both eyes daily. 1 drop both eyes; ophthalmic.   Wait at least 5 minutes between multiple drops in same eye.   ferrous sulfate 325 (65 FE) MG tablet Take 325 mg by mouth daily.   latanoprost (XALATAN) 0.005 % ophthalmic solution Place 1 drop into both eyes daily.   mirtazapine (REMERON) 7.5 MG tablet Take 7.5 mg by mouth at bedtime. Depression   NON FORMULARY Diet:Regular   PRESCRIPTION MEDICATION Endit Skin Protectant Ointment; topical Special Instructions: Apply to vaginal/perineal area bid. Twice A Day   No facility-administered encounter medications on file as of 09/20/2021.     SIGNIFICANT DIAGNOSTIC EXAMS  PREVIOUS   07-25-21: dexa: t score: -2.521  NO NEW EXAMS   PREVIOUS   05-21-21: hgb a1c  6.1; tsh 1.689; uric acid: 7.2  06-04-21: glucose 95; bun 35; creat 1.40; k+ 4.1; na++ 135; ca 8.4 protein 5.6; albumin 3.3 chol 107; ldl 50; trig 32; hdl 51 06-06-21: iron 31; tibc 249; vitamin D 51.87 06-07-21: wbc 5.0; hgb 8.6; hct 26.9; mcv 191.5 plt 175  07-08-21: hepatitis C nr; urine micro-albumin  54.5  NO NEW LABS   Review of Systems  Constitutional:  Negative for malaise/fatigue.  Respiratory:  Negative for cough and shortness of breath.   Cardiovascular:  Negative for chest pain, palpitations and leg swelling.  Gastrointestinal:  Negative for abdominal pain, constipation and heartburn.  Musculoskeletal:  Negative for back pain, joint pain and myalgias.  Skin: Negative.   Neurological:  Negative for dizziness.  Psychiatric/Behavioral:  The patient is not nervous/anxious.    Physical Exam Constitutional:      General: She is not in acute distress.    Appearance: She is well-developed. She is not diaphoretic.  Neck:     Thyroid: No thyromegaly.  Cardiovascular:     Rate and Rhythm: Normal rate and regular rhythm.     Pulses: Normal pulses.     Heart sounds: Normal heart sounds.  Pulmonary:     Effort: Pulmonary effort is normal. No respiratory distress.     Breath sounds: Normal breath sounds.  Abdominal:     General: Bowel sounds are normal. There is no distension.     Palpations: Abdomen is soft.     Tenderness: There is no abdominal tenderness.  Musculoskeletal:        General: Normal range of motion.     Cervical back: Neck supple.     Right lower leg: No edema.     Left lower leg: No edema.  Lymphadenopathy:     Cervical: No cervical adenopathy.  Skin:    General: Skin is warm and dry.  Neurological:     Mental Status: She is alert. Mental status is at baseline.  Psychiatric:        Mood and Affect: Mood normal.       ASSESSMENT/ PLAN:  TODAY  Benign hypertension with CKD (chronic kidney disease) stage III Stage 3 chronic kidney disease due type 2  diabetes mellitus  Dementia without behavioral disturbance Depression recurrent  Will continue current medications Will continue current plan of care Will continue to monitor her status.   Time spent with patient: 40 minutes: plan of care; medications; therapy    Synthia Innocent NP [facility]   call [phone]

## 2021-09-25 ENCOUNTER — Non-Acute Institutional Stay (SKILLED_NURSING_FACILITY): Payer: Medicare Other | Admitting: Adult Health

## 2021-09-25 ENCOUNTER — Encounter: Payer: Self-pay | Admitting: Adult Health

## 2021-09-25 DIAGNOSIS — E1122 Type 2 diabetes mellitus with diabetic chronic kidney disease: Secondary | ICD-10-CM

## 2021-09-25 DIAGNOSIS — N183 Chronic kidney disease, stage 3 unspecified: Secondary | ICD-10-CM | POA: Diagnosis not present

## 2021-09-25 DIAGNOSIS — M810 Age-related osteoporosis without current pathological fracture: Secondary | ICD-10-CM | POA: Diagnosis not present

## 2021-09-25 NOTE — Progress Notes (Signed)
Location:  [place] Room Number: 101 Place of Service:  SNF (31)   CODE STATUS: DNR  No Known Allergies  Chief Complaint  Patient presents with   Medical Management of Chronic Issues                                 Post menopausal osteoporosis     Controlled type 2 diabetes mellitus with stage 3 chronic kidney disease without long term current use of insulin:  Stage 3 chronic kidney disease due to type 2 diabetes mellitus    HPI:  She is a 77 year old long term resident of this facility being seen for the management of her chronic illnesses;  Post menopausal osteoporosis     Controlled type 2 diabetes mellitus with stage 3 chronic kidney disease without long term current use of insulin:  Stage 3 chronic kidney disease due to type 2 diabetes mellitus. There are no reports of uncontrolled pain. There are no reports of anxiety. Her cbg readings are more elevated and will require further treatment.   Past Medical History:  Diagnosis Date   CAD (coronary artery disease)    DM (diabetes mellitus) ([place])    Glaucoma    HTN (hypertension)    Obesity    Osteoarthritis     Past Surgical History:  Procedure Laterality Date   CHOLECYSTECTOMY     CORONARY STENT PLACEMENT     x3   REFRACTIVE SURGERY     detached retina   TUBAL LIGATION     76 yrs old    Social History   Socioeconomic History   Marital status: Divorced    Spouse name: Not on file   Number of children: 2   Years of education: Not on file   Highest education level: Not on file  Occupational History   Occupation: retired  Tobacco Use   Smoking status: Every Day    Packs/day: 1.00    Types: Cigarettes   Smokeless tobacco: Not on file  Substance and Sexual Activity   Alcohol use: Yes    Comment: rarely   Drug use: Not on file   Sexual activity: Not on file  Other Topics Concern   Not on file  Social History Narrative   Not on file   Social Determinants of Health   Financial  Resource Strain: Not on file  Food Insecurity: Not on file  Transportation Needs: Not on file  Physical Activity: Not on file  Stress: Not on file  Social Connections: Not on file  Intimate Partner Violence: Not on file   Family History  Problem Relation Age of Onset   Stroke Father    Diabetes Sister    Hypertension Sister    Arthritis Other    Heart disease Other    Hypertension Other    Stroke Other    Kidney disease Other    Diabetes Other       VITAL SIGNS BP (!) 147/67   Pulse 68   Temp 97.6 F (36.4 C)   Resp (!) 21   Wt 149 lb 9.6 oz (67.9 kg)   SpO2 98%   Outpatient Encounter Medications as of 09/25/2021  Medication Sig   alendronate (FOSAMAX) 70 MG tablet Take 70 mg by mouth once a week. Take with a full glass of water on an empty stomach.   aspirin 81 MG tablet Take 81 mg by mouth  daily.   atorvastatin (LIPITOR) 20 MG tablet Take 20 mg by mouth daily.   cholecalciferol (VITAMIN D3) 25 MCG (1000 UNIT) tablet Take 1,000 Units by mouth daily.   clobetasol cream (TEMOVATE) 0.05 % Apply to labia/vaginal area twice a week. Once A Day on Mon, Fri   dorzolamide-timolol (COSOPT) 22.3-6.8 MG/ML ophthalmic solution Place 1 drop into both eyes daily. 1 drop both eyes; ophthalmic.   Wait at least 5 minutes between multiple drops in same eye.   ferrous sulfate 325 (65 FE) MG tablet Take 325 mg by mouth daily.   latanoprost (XALATAN) 0.005 % ophthalmic solution Place 1 drop into both eyes daily.   NON FORMULARY Diet:Regular   PRESCRIPTION MEDICATION Endit Skin Protectant Ointment; topical Special Instructions: Apply to vaginal/perineal area bid. Twice A Day   [DISCONTINUED] mirtazapine (REMERON) 7.5 MG tablet Take 7.5 mg by mouth at bedtime. Depression   No facility-administered encounter medications on file as of 09/25/2021.     SIGNIFICANT DIAGNOSTIC EXAMS   PREVIOUS   07-25-21: dexa: t score: -2.521  NO NEW EXAMS   PREVIOUS   05-21-21: hgb a1c 6.1; tsh  1.689; uric acid: 7.2  06-04-21: glucose 95; bun 35; creat 1.40; k+ 4.1; na++ 135; ca 8.4 protein 5.6; albumin 3.3 chol 107; ldl 50; trig 32; hdl 51 06-06-21: iron 31; tibc 249; vitamin D 51.87 06-07-21: wbc 5.0; hgb 8.6; hct 26.9; mcv 191.5 plt 175  07-08-21: hepatitis C nr; urine micro-albumin  54.5  NO NEW LABS   Review of Systems  Constitutional:  Negative for malaise/fatigue.  Respiratory:  Negative for cough and shortness of breath.   Cardiovascular:  Negative for chest pain, palpitations and leg swelling.  Gastrointestinal:  Negative for abdominal pain, constipation and heartburn.  Musculoskeletal:  Negative for back pain, joint pain and myalgias.  Skin: Negative.   Neurological:  Negative for dizziness.  Psychiatric/Behavioral:  The patient is not nervous/anxious.    Physical Exam Constitutional:      General: She is not in acute distress.    Appearance: She is well-developed. She is not diaphoretic.  Neck:     Thyroid: No thyromegaly.  Cardiovascular:     Rate and Rhythm: Normal rate and regular rhythm.     Pulses: Normal pulses.     Heart sounds: Normal heart sounds.  Pulmonary:     Effort: Pulmonary effort is normal. No respiratory distress.     Breath sounds: Normal breath sounds.  Abdominal:     General: Bowel sounds are normal. There is no distension.     Palpations: Abdomen is soft.     Tenderness: There is no abdominal tenderness.  Musculoskeletal:        General: Normal range of motion.     Cervical back: Neck supple.     Right lower leg: No edema.     Left lower leg: No edema.  Lymphadenopathy:     Cervical: No cervical adenopathy.  Skin:    General: Skin is warm and dry.  Neurological:     Mental Status: She is alert. Mental status is at baseline.  Psychiatric:        Mood and Affect: Mood normal.      ASSESSMENT/ PLAN:  TODAY  Post menopausal osteoporosis t score  -2.561 will continue fosamax 70 mg weekly and vitamin D 1,000 units daily   2.  Controlled type 2 diabetes mellitus with stage 3 chronic kidney disease without long term current use of insulin: her cbg readings are  elevated will begin basaglar 10 units nightly will check hgb a1c   3. Stage 3 chronic kidney disease due to type 2 diabetes mellitus: bun 35; creat 1.40 gfr 33    PREVIOUS    4. Hyperlipidemia associated with type 2 diabetes mellitus: ldl 50 will continue lipitor 20 mg daily   5. Anemia due to stage 3b chronic kidney disease: will continue iron daily hgb 8.6  6. Increased intraocular pressure bilateral: will continue cosopt both eyes daily; xalatan to both eyes daily   7. Dementia without behavioral disturbance  weight is 149 pounds no significant change  8. Protein calorie malnutrition: protein 5.6 albumin 3.3 will continue supplements as directed  9. Chronic depression is off remeron. This medication was stopped due to falls.   10. Benign hypertension with CKD (chronic kidney disease) stage III: b/p 147/67     Synthia Innocent NP [facility]  call [phone]

## 2021-09-26 ENCOUNTER — Other Ambulatory Visit (HOSPITAL_COMMUNITY)
Admission: RE | Admit: 2021-09-26 | Discharge: 2021-09-26 | Disposition: A | Discharge status: Home / Self Care | Payer: Medicare Other | Source: Skilled Nursing Facility | Attending: Adult Health | Admitting: Adult Health

## 2021-09-26 DIAGNOSIS — E119 Type 2 diabetes mellitus without complications: Secondary | ICD-10-CM | POA: Diagnosis not present

## 2021-09-26 LAB — HEMOGLOBIN A1C
Hgb A1c MFr Bld: 9 % — ABNORMAL HIGH (ref 4.8–5.6)
Mean Plasma Glucose: 211.6 mg/dL

## 2021-09-30 ENCOUNTER — Encounter: Payer: Self-pay | Admitting: Adult Health

## 2021-09-30 ENCOUNTER — Non-Acute Institutional Stay (SKILLED_NURSING_FACILITY): Payer: Medicare Other | Admitting: Adult Health

## 2021-09-30 DIAGNOSIS — I129 Hypertensive chronic kidney disease with stage 1 through stage 4 chronic kidney disease, or unspecified chronic kidney disease: Secondary | ICD-10-CM | POA: Diagnosis not present

## 2021-09-30 DIAGNOSIS — Z794 Long term (current) use of insulin: Secondary | ICD-10-CM

## 2021-09-30 DIAGNOSIS — N183 Chronic kidney disease, stage 3 unspecified: Secondary | ICD-10-CM | POA: Diagnosis not present

## 2021-09-30 DIAGNOSIS — E1122 Type 2 diabetes mellitus with diabetic chronic kidney disease: Secondary | ICD-10-CM

## 2021-09-30 DIAGNOSIS — N1831 Chronic kidney disease, stage 3a: Secondary | ICD-10-CM

## 2021-09-30 NOTE — Progress Notes (Unsigned)
Location:  [facility] Room Number: 101-D Place of Service:  SNF (31)   CODE STATUS: DNR  No Known Allergies  Chief Complaint  Patient presents with   Acute Visit    Diabetes    HPI:  Her hgb a1c is 9.0. her cbg readings have been in the upper 100's through 200's. Her blood pressure readings have been with systolic 140's through 150's. There are no reports of headaches; no chest pain; no palpitations; no excessive hunger or thirst. She is presently taking basaglar 10 units nightly. Is not on any hypertensive at this time   Past Medical History:  Diagnosis Date   CAD (coronary artery disease)    DM (diabetes mellitus) (HCC)    Glaucoma    HTN (hypertension)    Obesity    Osteoarthritis     Past Surgical History:  Procedure Laterality Date   CHOLECYSTECTOMY     CORONARY STENT PLACEMENT     x3   REFRACTIVE SURGERY     detached retina   TUBAL LIGATION     76 yrs old    Social History   Socioeconomic History   Marital status: Divorced    Spouse name: Not on file   Number of children: 2   Years of education: Not on file   Highest education level: Not on file  Occupational History   Occupation: retired  Tobacco Use   Smoking status: Every Day    Packs/day: 1.00    Types: Cigarettes   Smokeless tobacco: Not on file  Substance and Sexual Activity   Alcohol use: Yes    Comment: rarely   Drug use: Not on file   Sexual activity: Not on file  Other Topics Concern   Not on file  Social History Narrative   Not on file   Social Determinants of Health   Financial Resource Strain: Not on file  Food Insecurity: Not on file  Transportation Needs: Not on file  Physical Activity: Not on file  Stress: Not on file  Social Connections: Not on file  Intimate Partner Violence: Not on file   Family History  Problem Relation Age of Onset   Stroke Father    Diabetes Sister    Hypertension Sister    Arthritis Other    Heart disease Other     Hypertension Other    Stroke Other    Kidney disease Other    Diabetes Other       VITAL SIGNS BP (!) 155/75   Pulse 72   Temp (!) 97.5 F (36.4 C)   Resp (!) 22   Wt 149 lb 9.6 oz (67.9 kg)   SpO2 98%   Outpatient Encounter Medications as of 09/30/2021  Medication Sig   alendronate (FOSAMAX) 70 MG tablet Take 70 mg by mouth once a week. Take with a full glass of water on an empty stomach.   aspirin 81 MG tablet Take 81 mg by mouth daily.   atorvastatin (LIPITOR) 20 MG tablet Take 20 mg by mouth daily.   cholecalciferol (VITAMIN D3) 25 MCG (1000 UNIT) tablet Take 1,000 Units by mouth daily.   clobetasol cream (TEMOVATE) 0.05 % Apply to labia/vaginal area twice a week. Once A Day on Mon, Fri   dorzolamide-timolol (COSOPT) 22.3-6.8 MG/ML ophthalmic solution Place 1 drop into both eyes daily. 1 drop both eyes; ophthalmic.   Wait at least 5 minutes between multiple drops in same eye.   ferrous sulfate 325 (65 FE) MG  tablet Take 325 mg by mouth daily.   Insulin Glargine (BASAGLAR KWIKPEN) 100 UNIT/ML Inject 10 Units into the skin at bedtime.   latanoprost (XALATAN) 0.005 % ophthalmic solution Place 1 drop into both eyes daily.   NON FORMULARY Diet:Regular   PRESCRIPTION MEDICATION Endit Skin Protectant Ointment; topical Special Instructions: Apply to vaginal/perineal area bid. Twice A Day   No facility-administered encounter medications on file as of 09/30/2021.     SIGNIFICANT DIAGNOSTIC EXAMS  PREVIOUS   07-25-21: dexa: t score: -2.521  NO NEW EXAMS   PREVIOUS   05-21-21: hgb a1c 6.1; tsh 1.689; uric acid: 7.2  06-04-21: glucose 95; bun 35; creat 1.40; k+ 4.1; na++ 135; ca 8.4 protein 5.6; albumin 3.3 chol 107; ldl 50; trig 32; hdl 51 06-06-21: iron 31; tibc 249; vitamin D 51.87 06-07-21: wbc 5.0; hgb 8.6; hct 26.9; mcv 191.5 plt 175  07-08-21: hepatitis C nr; urine micro-albumin  54.5  TODAY  09-26-21: hgb a1c 9.0    Review of Systems  Constitutional:  Negative for  malaise/fatigue.  Respiratory:  Negative for cough and shortness of breath.   Cardiovascular:  Negative for chest pain, palpitations and leg swelling.  Gastrointestinal:  Negative for abdominal pain, constipation and heartburn.  Musculoskeletal:  Negative for back pain, joint pain and myalgias.  Skin: Negative.   Neurological:  Negative for dizziness.  Psychiatric/Behavioral:  The patient is not nervous/anxious.    Physical Exam Constitutional:      General: She is not in acute distress.    Appearance: She is well-developed. She is not diaphoretic.  Neck:     Thyroid: No thyromegaly.  Cardiovascular:     Rate and Rhythm: Normal rate and regular rhythm.     Pulses: Normal pulses.     Heart sounds: Normal heart sounds.  Pulmonary:     Effort: Pulmonary effort is normal. No respiratory distress.     Breath sounds: Normal breath sounds.  Abdominal:     General: Bowel sounds are normal. There is no distension.     Palpations: Abdomen is soft.     Tenderness: There is no abdominal tenderness.  Musculoskeletal:        General: Normal range of motion.     Cervical back: Neck supple.     Right lower leg: No edema.     Left lower leg: No edema.  Lymphadenopathy:     Cervical: No cervical adenopathy.  Skin:    General: Skin is warm and dry.  Neurological:     Mental Status: She is alert. Mental status is at baseline.  Psychiatric:        Mood and Affect: Mood normal.        ASSESSMENT/ PLAN:  TODAY  Controlled type 2 diabetes mellitus with stage 3 chronic kidney disease with long term current use of insulin  Long term insulin use Hypertension associated with stage 3a chronic kidney disease due to type 2 diabetes mellitus  Will begin lisinopril 5 mg daily  Will increase basaglar to 12 units nightly    Synthia Innocent NP [facility]   call [phone]

## 2021-10-01 DIAGNOSIS — E1122 Type 2 diabetes mellitus with diabetic chronic kidney disease: Secondary | ICD-10-CM | POA: Insufficient documentation

## 2021-10-01 DIAGNOSIS — Z794 Long term (current) use of insulin: Secondary | ICD-10-CM | POA: Insufficient documentation

## 2021-10-07 ENCOUNTER — Non-Acute Institutional Stay (SKILLED_NURSING_FACILITY): Payer: Medicare Other | Admitting: Adult Health

## 2021-10-07 ENCOUNTER — Encounter: Payer: Self-pay | Admitting: Adult Health

## 2021-10-07 ENCOUNTER — Other Ambulatory Visit (HOSPITAL_COMMUNITY)
Admission: RE | Admit: 2021-10-07 | Discharge: 2021-10-07 | Disposition: A | Discharge status: Home / Self Care | Payer: Medicare Other | Source: Skilled Nursing Facility | Attending: Adult Health | Admitting: Adult Health

## 2021-10-07 DIAGNOSIS — Z794 Long term (current) use of insulin: Secondary | ICD-10-CM | POA: Diagnosis not present

## 2021-10-07 DIAGNOSIS — N183 Chronic kidney disease, stage 3 unspecified: Secondary | ICD-10-CM | POA: Diagnosis not present

## 2021-10-07 DIAGNOSIS — E1122 Type 2 diabetes mellitus with diabetic chronic kidney disease: Secondary | ICD-10-CM

## 2021-10-07 DIAGNOSIS — I1310 Hypertensive heart and chronic kidney disease without heart failure, with stage 1 through stage 4 chronic kidney disease, or unspecified chronic kidney disease: Secondary | ICD-10-CM | POA: Insufficient documentation

## 2021-10-07 DIAGNOSIS — I129 Hypertensive chronic kidney disease with stage 1 through stage 4 chronic kidney disease, or unspecified chronic kidney disease: Secondary | ICD-10-CM | POA: Diagnosis not present

## 2021-10-07 DIAGNOSIS — N1831 Chronic kidney disease, stage 3a: Secondary | ICD-10-CM

## 2021-10-07 LAB — BASIC METABOLIC PANEL
Anion gap: 4 — ABNORMAL LOW (ref 5–15)
BUN: 47 mg/dL — ABNORMAL HIGH (ref 8–23)
CO2: 25 mmol/L (ref 22–32)
Calcium: 8.8 mg/dL — ABNORMAL LOW (ref 8.9–10.3)
Chloride: 108 mmol/L (ref 98–111)
Creatinine, Ser: 1.9 mg/dL — ABNORMAL HIGH (ref 0.44–1.00)
GFR, Estimated: 27 mL/min — ABNORMAL LOW (ref >60)
Glucose, Bld: 282 mg/dL — ABNORMAL HIGH (ref 70–99)
Potassium: 4.3 mmol/L (ref 3.5–5.1)
Sodium: 137 mmol/L (ref 135–145)

## 2021-10-07 NOTE — Progress Notes (Unsigned)
Location:  [facility] Room Number: N101/D Place of Service:  SNF (31) Chilton Si, Chong Sicilian, NP  CODE STATUS: DNR  No Known Allergies  Chief Complaint  Patient presents with   Acute Visit    Patient is  here for lab F/U    HPI:  Her glucose this AM is 282. She is presently taking lantus 12 units nightly. Her renal function has worsened since starting lisinopril; started on 10-02-21.Marland Kitchen Her cbg readings are between the upper 100's and lower 200's. There are no reports of headaches; no changes in intake.   Past Medical History:  Diagnosis Date   CAD (coronary artery disease)    DM (diabetes mellitus) (HCC)    Glaucoma    HTN (hypertension)    Obesity    Osteoarthritis     Past Surgical History:  Procedure Laterality Date   CHOLECYSTECTOMY     CORONARY STENT PLACEMENT     x3   REFRACTIVE SURGERY     detached retina   TUBAL LIGATION     76 yrs old    Social History   Socioeconomic History   Marital status: Divorced    Spouse name: Not on file   Number of children: 2   Years of education: Not on file   Highest education level: Not on file  Occupational History   Occupation: retired  Tobacco Use   Smoking status: Every Day    Packs/day: 1.00    Types: Cigarettes   Smokeless tobacco: Not on file  Substance and Sexual Activity   Alcohol use: Yes    Comment: rarely   Drug use: Not on file   Sexual activity: Not on file  Other Topics Concern   Not on file  Social History Narrative   Not on file   Social Determinants of Health   Financial Resource Strain: Not on file  Food Insecurity: Not on file  Transportation Needs: Not on file  Physical Activity: Not on file  Stress: Not on file  Social Connections: Not on file  Intimate Partner Violence: Not on file   Family History  Problem Relation Age of Onset   Stroke Father    Diabetes Sister    Hypertension Sister    Arthritis Other    Heart disease Other    Hypertension Other     Stroke Other    Kidney disease Other    Diabetes Other       VITAL SIGNS BP 130/70   Pulse 62   Temp 97.8 F (36.6 C)   Resp 18   Ht 5\' 4"  (1.626 m)   Wt 149 lb 9.6 oz (67.9 kg)   SpO2 100%   BMI 25.68 kg/m   Outpatient Encounter Medications as of 10/07/2021  Medication Sig   alendronate (FOSAMAX) 70 MG tablet Take 70 mg by mouth once a week. Take with a full glass of water on an empty stomach.   aspirin 81 MG tablet Take 81 mg by mouth daily.   atorvastatin (LIPITOR) 20 MG tablet Take 20 mg by mouth daily.   cholecalciferol (VITAMIN D3) 25 MCG (1000 UNIT) tablet Take 1,000 Units by mouth daily.   clobetasol cream (TEMOVATE) 0.05 % Apply to labia/vaginal area twice a week. Once A Day on Mon, Fri   dorzolamide-timolol (COSOPT) 22.3-6.8 MG/ML ophthalmic solution Place 1 drop into both eyes daily. 1 drop both eyes; ophthalmic.   Wait at least 5 minutes between multiple drops in same eye.   ferrous  sulfate 325 (65 FE) MG tablet Take 325 mg by mouth daily.   Insulin Glargine (BASAGLAR KWIKPEN) 100 UNIT/ML Inject 12 Units into the skin at bedtime.   latanoprost (XALATAN) 0.005 % ophthalmic solution Place 1 drop into both eyes daily.   lisinopril (ZESTRIL) 5 MG tablet Take 5 mg by mouth daily.   NON FORMULARY Diet:Regular   PRESCRIPTION MEDICATION Endit Skin Protectant Ointment; topical Special Instructions: Apply to vaginal/perineal area bid. Twice A Day   No facility-administered encounter medications on file as of 10/07/2021.     SIGNIFICANT DIAGNOSTIC EXAMS   PREVIOUS   07-25-21: dexa: t score: -2.521  NO NEW EXAMS   PREVIOUS   05-21-21: hgb a1c 6.1; tsh 1.689; uric acid: 7.2  06-04-21: glucose 95; bun 35; creat 1.40; k+ 4.1; na++ 135; ca 8.4 protein 5.6; albumin 3.3 chol 107; ldl 50; trig 32; hdl 51 06-06-21: iron 31; tibc 249; vitamin D 51.87 06-07-21: wbc 5.0; hgb 8.6; hct 26.9; mcv 191.5 plt 175  07-08-21: hepatitis C nr; urine micro-albumin  54.5 09-26-21: hgb a1c 9.0     TODAY  10-07-21: glucose 282; bun 47; creat 1.90; k+ 4.3; na++ 137; ca 8.8; gfr 27   Review of Systems  Constitutional:  Negative for malaise/fatigue.  Respiratory:  Negative for cough and shortness of breath.   Cardiovascular:  Negative for chest pain, palpitations and leg swelling.  Gastrointestinal:  Negative for abdominal pain, constipation and heartburn.  Musculoskeletal:  Negative for back pain, joint pain and myalgias.  Skin: Negative.   Neurological:  Negative for dizziness.  Psychiatric/Behavioral:  The patient is not nervous/anxious.    Physical Exam Constitutional:      General: She is not in acute distress.    Appearance: She is well-developed. She is not diaphoretic.  Neck:     Thyroid: No thyromegaly.  Cardiovascular:     Rate and Rhythm: Normal rate and regular rhythm.     Pulses: Normal pulses.     Heart sounds: Normal heart sounds.  Pulmonary:     Effort: Pulmonary effort is normal. No respiratory distress.     Breath sounds: Normal breath sounds.  Abdominal:     General: Bowel sounds are normal. There is no distension.     Palpations: Abdomen is soft.     Tenderness: There is no abdominal tenderness.  Musculoskeletal:        General: Normal range of motion.     Cervical back: Neck supple.     Right lower leg: No edema.     Left lower leg: No edema.  Lymphadenopathy:     Cervical: No cervical adenopathy.  Skin:    General: Skin is warm and dry.  Neurological:     Mental Status: She is alert. Mental status is at baseline.  Psychiatric:        Mood and Affect: Mood normal.       ASSESSMENT/ PLAN:  TODAY  Controlled type 2 diabetes mellitus with stage 3 chronic kidney disease with long term current use of insulin  Stage 3 chronic kidney disease due to type 2 diabetes mellitus Hypertension associated with stage 3 chronic kidney disease due to type 2 diabetes mellitus   Will increase lantus to 15 units nightly  Will stop lisinopril due to  worsening renal function Will repeat bmp on 10-14-21.    Synthia Innocent NP [facility]   call [phone]

## 2021-10-14 ENCOUNTER — Other Ambulatory Visit (HOSPITAL_COMMUNITY)
Admission: RE | Admit: 2021-10-14 | Discharge: 2021-10-14 | Disposition: A | Discharge status: Home / Self Care | Payer: Medicare Other | Source: Skilled Nursing Facility | Attending: Adult Health | Admitting: Adult Health

## 2021-10-14 DIAGNOSIS — I1310 Hypertensive heart and chronic kidney disease without heart failure, with stage 1 through stage 4 chronic kidney disease, or unspecified chronic kidney disease: Secondary | ICD-10-CM | POA: Insufficient documentation

## 2021-10-14 LAB — BASIC METABOLIC PANEL
Anion gap: 3 — ABNORMAL LOW (ref 5–15)
BUN: 36 mg/dL — ABNORMAL HIGH (ref 8–23)
CO2: 26 mmol/L (ref 22–32)
Calcium: 8.4 mg/dL — ABNORMAL LOW (ref 8.9–10.3)
Chloride: 108 mmol/L (ref 98–111)
Creatinine, Ser: 1.77 mg/dL — ABNORMAL HIGH (ref 0.44–1.00)
GFR, Estimated: 29 mL/min — ABNORMAL LOW (ref >60)
Glucose, Bld: 104 mg/dL — ABNORMAL HIGH (ref 70–99)
Potassium: 4.4 mmol/L (ref 3.5–5.1)
Sodium: 137 mmol/L (ref 135–145)

## 2021-10-28 ENCOUNTER — Encounter: Payer: Self-pay | Admitting: Adult Health

## 2021-10-28 ENCOUNTER — Non-Acute Institutional Stay (SKILLED_NURSING_FACILITY): Payer: Medicare Other | Admitting: Adult Health

## 2021-10-28 DIAGNOSIS — N1832 Chronic kidney disease, stage 3b: Secondary | ICD-10-CM

## 2021-10-28 DIAGNOSIS — H40053 Ocular hypertension, bilateral: Secondary | ICD-10-CM | POA: Diagnosis not present

## 2021-10-28 DIAGNOSIS — D631 Anemia in chronic kidney disease: Secondary | ICD-10-CM

## 2021-10-28 DIAGNOSIS — E1169 Type 2 diabetes mellitus with other specified complication: Secondary | ICD-10-CM

## 2021-10-28 DIAGNOSIS — E785 Hyperlipidemia, unspecified: Secondary | ICD-10-CM | POA: Diagnosis not present

## 2021-10-28 NOTE — Progress Notes (Signed)
Location:  [facility] Room Number: 101 Place of Service:  SNF (31)   CODE STATUS: dnr   No Known Allergies  Chief Complaint  Patient presents with   Medical Management of Chronic Issues                  Hyperlipidemia associated with type 2 diabetes mellitus:    Anemia due to stage 3b chronic kidney disease:    Increased intraocular pressure bilateral:     HPI:  She is a 76 year old long term resident of this facility being seen for the management of her chronic illnesses:  Hyperlipidemia associated with type 2 diabetes mellitus:    Anemia due to stage 3b chronic kidney disease:    Increased intraocular pressure bilateral:. She does get out of bed daily; there are no reports of agitation or anxiety. There are no reports of changes in appetite.   Past Medical History:  Diagnosis Date   CAD (coronary artery disease)    DM (diabetes mellitus) (HCC)    Glaucoma    HTN (hypertension)    Obesity    Osteoarthritis     Past Surgical History:  Procedure Laterality Date   CHOLECYSTECTOMY     CORONARY STENT PLACEMENT     x3   REFRACTIVE SURGERY     detached retina   TUBAL LIGATION     75 yrs old    Social History   Socioeconomic History   Marital status: Divorced    Spouse name: Not on file   Number of children: 2   Years of education: Not on file   Highest education level: Not on file  Occupational History   Occupation: retired  Tobacco Use   Smoking status: Every Day    Packs/day: 1.00    Types: Cigarettes   Smokeless tobacco: Not on file  Substance and Sexual Activity   Alcohol use: Yes    Comment: rarely   Drug use: Not on file   Sexual activity: Not on file  Other Topics Concern   Not on file  Social History Narrative   Not on file   Social Determinants of Health   Financial Resource Strain: Not on file  Food Insecurity: Not on file  Transportation Needs: Not on file  Physical Activity: Not on file  Stress: Not on file   Social Connections: Not on file  Intimate Partner Violence: Not on file   Family History  Problem Relation Age of Onset   Stroke Father    Diabetes Sister    Hypertension Sister    Arthritis Other    Heart disease Other    Hypertension Other    Stroke Other    Kidney disease Other    Diabetes Other       VITAL SIGNS BP (!) 118/52   Pulse 66   Temp 98 F (36.7 C)   Resp 20   Ht 5\' 4"  (1.626 m)   Wt 156 lb 3.2 oz (70.9 kg)   SpO2 97%   BMI 26.81 kg/m   Outpatient Encounter Medications as of 10/28/2021  Medication Sig   alendronate (FOSAMAX) 70 MG tablet Take 70 mg by mouth once a week. Take with a full glass of water on an empty stomach.   aspirin 81 MG tablet Take 81 mg by mouth daily.   atorvastatin (LIPITOR) 20 MG tablet Take 20 mg by mouth daily.   cholecalciferol (VITAMIN D3) 25 MCG (1000 UNIT) tablet Take 1,000  Units by mouth daily.   clobetasol cream (TEMOVATE) 0.05 % Apply to labia/vaginal area twice a week. Once A Day on Mon, Fri   dorzolamide-timolol (COSOPT) 22.3-6.8 MG/ML ophthalmic solution Place 1 drop into both eyes daily. 1 drop both eyes; ophthalmic.   Wait at least 5 minutes between multiple drops in same eye.   ferrous sulfate 325 (65 FE) MG tablet Take 325 mg by mouth daily.   Insulin Glargine (BASAGLAR KWIKPEN) 100 UNIT/ML Inject 15 Units into the skin at bedtime.   latanoprost (XALATAN) 0.005 % ophthalmic solution Place 1 drop into both eyes daily.   lisinopril (ZESTRIL) 5 MG tablet Take 5 mg by mouth daily.   NON FORMULARY Diet:Regular   PRESCRIPTION MEDICATION Endit Skin Protectant Ointment; topical Special Instructions: Apply to vaginal/perineal area bid. Twice A Day   No facility-administered encounter medications on file as of 10/28/2021.     SIGNIFICANT DIAGNOSTIC EXAMS  PREVIOUS   07-25-21: dexa: t score: -2.521  NO NEW EXAMS   PREVIOUS   05-21-21: hgb a1c 6.1; tsh 1.689; uric acid: 7.2  06-04-21: glucose 95; bun 35; creat 1.40;  k+ 4.1; na++ 135; ca 8.4 protein 5.6; albumin 3.3 chol 107; ldl 50; trig 32; hdl 51 06-06-21: iron 31; tibc 249; vitamin D 51.87 06-07-21: wbc 5.0; hgb 8.6; hct 26.9; mcv 191.5 plt 175  07-08-21: hepatitis C nr; urine micro-albumin  54.5 09-26-21: hgb a1c 9.0   10-07-21: glucose 282; bun 47; creat 1.90; k+ 4.3; na++ 137; ca 8.8; gfr 27  TODAY  11-06-21: glucose 104; bun 36; creat 1.77; k+ 4.4; na++ 137; ca 8.4; gfr 29     Review of Systems  Constitutional:  Negative for malaise/fatigue.  Respiratory:  Negative for cough and shortness of breath.   Cardiovascular:  Negative for chest pain, palpitations and leg swelling.  Gastrointestinal:  Negative for abdominal pain, constipation and heartburn.  Musculoskeletal:  Negative for back pain, joint pain and myalgias.  Skin: Negative.   Neurological:  Negative for dizziness.  Psychiatric/Behavioral:  The patient is not nervous/anxious.    Physical Exam Constitutional:      General: She is not in acute distress.    Appearance: She is well-developed. She is not diaphoretic.  Neck:     Thyroid: No thyromegaly.  Cardiovascular:     Rate and Rhythm: Normal rate and regular rhythm.     Pulses: Normal pulses.     Heart sounds: Normal heart sounds.  Pulmonary:     Effort: Pulmonary effort is normal. No respiratory distress.     Breath sounds: Normal breath sounds.  Abdominal:     General: Bowel sounds are normal. There is no distension.     Palpations: Abdomen is soft.     Tenderness: There is no abdominal tenderness.  Musculoskeletal:        General: Normal range of motion.     Cervical back: Neck supple.     Right lower leg: No edema.     Left lower leg: No edema.  Lymphadenopathy:     Cervical: No cervical adenopathy.  Skin:    General: Skin is warm and dry.  Neurological:     Mental Status: She is alert. Mental status is at baseline.  Psychiatric:        Mood and Affect: Mood normal.       ASSESSMENT/  PLAN:  TODAY  Hyperlipidemia associated with type 2 diabetes mellitus: ldl 50; will continue lipitor 20 mg daily   2. Anemia due  to stage 3b chronic kidney disease: hgb 8.6 will continue iron daily   3. Increased intraocular pressure bilateral: will continue cosopt to both eyes and xalatan to both eyes.    PREVIOUS    4. Dementia without behavioral disturbance  weight is 149 pounds no significant change  5. Protein calorie malnutrition: protein 5.6 albumin 3.3 will continue supplements as directed  6. Chronic depression is off remeron. This medication was stopped due to falls.   7. Benign hypertension with CKD (chronic kidney disease) stage III: b/p 118/52 will continue lisinopril 5 mg daily   8. Post menopausal osteoporosis t score  -2.561 will continue fosamax 70 mg weekly   9. 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 Stage 3 chronic kidney disease due to type 2 diabetes mellitus: bun 36; creat 1.77 gfr 29       Synthia Innocent NP [facility]  call [phone]

## 2021-11-04 ENCOUNTER — Non-Acute Institutional Stay (SKILLED_NURSING_FACILITY): Payer: Medicare Other | Admitting: Internal Medicine

## 2021-11-04 ENCOUNTER — Encounter: Payer: Self-pay | Admitting: Internal Medicine

## 2021-11-04 DIAGNOSIS — Z794 Long term (current) use of insulin: Secondary | ICD-10-CM

## 2021-11-04 DIAGNOSIS — N183 Chronic kidney disease, stage 3 unspecified: Secondary | ICD-10-CM

## 2021-11-04 DIAGNOSIS — D631 Anemia in chronic kidney disease: Secondary | ICD-10-CM | POA: Diagnosis not present

## 2021-11-04 DIAGNOSIS — E1122 Type 2 diabetes mellitus with diabetic chronic kidney disease: Secondary | ICD-10-CM

## 2021-11-04 DIAGNOSIS — N1832 Chronic kidney disease, stage 3b: Secondary | ICD-10-CM

## 2021-11-04 NOTE — Assessment & Plan Note (Addendum)
10/06/21 A1c of 9% indicates poorly controlled DM with 40% increased MI/CVA risk. FBS average will be verified & glargine adjusted to get FBS < 200 if possible. A1c goal = < 8%, ideally < 7% if no associated hypoglycemia.

## 2021-11-04 NOTE — Progress Notes (Unsigned)
   NURSING HOME LOCATION:  [facility] ROOM NUMBER:  101  CODE STATUS:  DNR  PCP:  Synthia Innocent NP  This is a nursing facility follow up visit of chronic medical diagnoses & to document compliance with Regulation 483.30 (c) in The Long Term Care Survey Manual Phase 2 which mandates caregiver visit ( visits can alternate among physician, PA or NP as per statutes) within 10 days of 30 days / 60 days/ 90 days post admission to SNF date    Interim medical record and care since last SNF visit was updated with review of diagnostic studies and change in clinical status since last visit were documented.  HPI:  Review of systems: Dementia invalidated responses. Date given as   Constitutional: No fever, significant weight change, fatigue  Eyes: No redness, discharge, pain, vision change ENT/mouth: No nasal congestion,  purulent discharge, earache, change in hearing, sore throat  Cardiovascular: No chest pain, palpitations, paroxysmal nocturnal dyspnea, claudication, edema  Respiratory: No cough, sputum production, hemoptysis, DOE, significant snoring, apnea   Gastrointestinal: No heartburn, dysphagia, abdominal pain, nausea /vomiting, rectal bleeding, melena, change in bowels Genitourinary: No dysuria, hematuria, pyuria, incontinence, nocturia Musculoskeletal: No joint stiffness, joint swelling, weakness, pain Dermatologic: No rash, pruritus, change in appearance of skin Neurologic: No dizziness, headache, syncope, seizures, numbness, tingling Psychiatric: No significant anxiety, depression, insomnia, anorexia Endocrine: No change in hair/skin/nails, excessive thirst, excessive hunger, excessive urination  Hematologic/lymphatic: No significant bruising, lymphadenopathy, abnormal bleeding Allergy/immunology: No itchy/watery eyes, significant sneezing, urticaria, angioedema  Physical exam:  Pertinent or positive findings: General appearance: Adequately nourished; no acute  distress, increased work of breathing is present.   Lymphatic: No lymphadenopathy about the head, neck, axilla. Eyes: No conjunctival inflammation or lid edema is present. There is no scleral icterus. Ears:  External ear exam shows no significant lesions or deformities.   Nose:  External nasal examination shows no deformity or inflammation. Nasal mucosa are pink and moist without lesions, exudates Oral exam:  Lips and gums are healthy appearing. There is no oropharyngeal erythema or exudate. Neck:  No thyromegaly, masses, tenderness noted.    Heart:  Normal rate and regular rhythm. S1 and S2 normal without gallop, murmur, click, rub .  Lungs: Chest clear to auscultation without wheezes, rhonchi, rales, rubs. Abdomen: Bowel sounds are normal. Abdomen is soft and nontender with no organomegaly, hernias, masses. GU: Deferred  Extremities:  No cyanosis, clubbing, edema  Neurologic exam : Cn 2-7 intact Strength equal  in upper & lower extremities Balance, Rhomberg, finger to nose testing could not be completed due to clinical state Deep tendon reflexes are equal Skin: Warm & dry w/o tenting. No significant lesions or rash.  See summary under each active problem in the Problem List with associated updated therapeutic plan

## 2021-11-04 NOTE — Assessment & Plan Note (Signed)
06/07/21 H/H 8.6/26.9 with MCV 101.5. Iron level low normal @ 31. Update CBC with B12 level

## 2021-11-04 NOTE — Assessment & Plan Note (Signed)
10/14/21 creatinine 1.77 (1.90) & GFR 29 (27); stable CKD high Stage 4. Med list reviewed; on low dose ACE-I,but no other potentially nephrotoxic agents. ACE-I will be D/Ced if CKD does not remain stable & progresses further.

## 2021-11-05 NOTE — Patient Instructions (Signed)
See assessment and plan under each diagnosis in the problem list and acutely for this visit 

## 2021-11-07 ENCOUNTER — Encounter (HOSPITAL_COMMUNITY)
Admission: RE | Admit: 2021-11-07 | Discharge: 2021-11-07 | Disposition: A | Discharge status: Home / Self Care | Payer: Medicare Other | Source: Skilled Nursing Facility | Attending: Adult Health | Admitting: Adult Health

## 2021-11-07 DIAGNOSIS — E119 Type 2 diabetes mellitus without complications: Secondary | ICD-10-CM | POA: Diagnosis not present

## 2021-11-07 LAB — CBC
HCT: 29 % — ABNORMAL LOW (ref 36.0–46.0)
Hemoglobin: 9.8 g/dL — ABNORMAL LOW (ref 12.0–15.0)
MCH: 31.7 pg (ref 26.0–34.0)
MCHC: 33.8 g/dL (ref 30.0–36.0)
MCV: 93.9 fL (ref 80.0–100.0)
Platelets: 220 10*3/uL (ref 150–400)
RBC: 3.09 MIL/uL — ABNORMAL LOW (ref 3.87–5.11)
RDW: 13.6 % (ref 11.5–15.5)
WBC: 7.1 10*3/uL (ref 4.0–10.5)
nRBC: 0 % (ref 0.0–0.2)

## 2021-11-07 LAB — VITAMIN B12: Vitamin B-12: 308 pg/mL (ref 180–914)

## 2021-11-12 ENCOUNTER — Non-Acute Institutional Stay (SKILLED_NURSING_FACILITY): Payer: Medicare Other | Admitting: Adult Health

## 2021-11-12 ENCOUNTER — Encounter: Payer: Self-pay | Admitting: Adult Health

## 2021-11-12 DIAGNOSIS — L299 Pruritus, unspecified: Secondary | ICD-10-CM | POA: Diagnosis not present

## 2021-11-12 NOTE — Progress Notes (Unsigned)
Location:  [facility] Room Number: 101 Place of Service:   snf    CODE STATUS: dnr   No Known Allergies  Chief Complaint  Patient presents with   Acute Visit    Itchy skin     HPI:  Staff reports that she is having itchy skin. There are scratch marks present. She is unable to tell me how long she has been itching except to say a long time.   Past Medical History:  Diagnosis Date   CAD (coronary artery disease)    DM (diabetes mellitus) (HCC)    Glaucoma    HTN (hypertension)    Obesity    Osteoarthritis     Past Surgical History:  Procedure Laterality Date   CHOLECYSTECTOMY     CORONARY STENT PLACEMENT     x3   REFRACTIVE SURGERY     detached retina   TUBAL LIGATION     76 yrs old    Social History   Socioeconomic History   Marital status: Divorced    Spouse name: Not on file   Number of children: 2   Years of education: Not on file   Highest education level: Not on file  Occupational History   Occupation: retired  Tobacco Use   Smoking status: Every Day    Packs/day: 1.00    Types: Cigarettes   Smokeless tobacco: Not on file  Substance and Sexual Activity   Alcohol use: Yes    Comment: rarely   Drug use: Not on file   Sexual activity: Not on file  Other Topics Concern   Not on file  Social History Narrative   Not on file   Social Determinants of Health   Financial Resource Strain: Not on file  Food Insecurity: Not on file  Transportation Needs: Not on file  Physical Activity: Not on file  Stress: Not on file  Social Connections: Not on file  Intimate Partner Violence: Not on file   Family History  Problem Relation Age of Onset   Stroke Father    Diabetes Sister    Hypertension Sister    Arthritis Other    Heart disease Other    Hypertension Other    Stroke Other    Kidney disease Other    Diabetes Other       VITAL SIGNS BP (!) 148/72   Pulse 74   Temp 98.4 F (36.9 C)   Resp (!) 22   Ht 5\' 4"   (1.626 m)   Wt 156 lb 3.2 oz (70.9 kg)   SpO2 97%   BMI 26.81 kg/m   Outpatient Encounter Medications as of 11/12/2021  Medication Sig   alendronate (FOSAMAX) 70 MG tablet Take 70 mg by mouth once a week. Take with a full glass of water on an empty stomach.   aspirin 81 MG tablet Take 81 mg by mouth daily.   atorvastatin (LIPITOR) 20 MG tablet Take 20 mg by mouth daily.   cholecalciferol (VITAMIN D3) 25 MCG (1000 UNIT) tablet Take 1,000 Units by mouth daily.   clobetasol cream (TEMOVATE) 0.05 % Apply to labia/vaginal area twice a week. Once A Day on Mon, Fri   dorzolamide-timolol (COSOPT) 22.3-6.8 MG/ML ophthalmic solution Place 1 drop into both eyes daily. 1 drop both eyes; ophthalmic.   Wait at least 5 minutes between multiple drops in same eye.   ferrous sulfate 325 (65 FE) MG tablet Take 325 mg by mouth daily.   Insulin Glargine (BASAGLAR  KWIKPEN) 100 UNIT/ML Inject 15 Units into the skin at bedtime.   latanoprost (XALATAN) 0.005 % ophthalmic solution Place 1 drop into both eyes daily.   lisinopril (ZESTRIL) 5 MG tablet Take 5 mg by mouth daily.   NON FORMULARY Diet:Regular   PRESCRIPTION MEDICATION Endit Skin Protectant Ointment; topical Special Instructions: Apply to vaginal/perineal area bid. Twice A Day   No facility-administered encounter medications on file as of 11/12/2021.     SIGNIFICANT DIAGNOSTIC EXAMS  PREVIOUS   07-25-21: dexa: t score: -2.521  NO NEW EXAMS   PREVIOUS   05-21-21: hgb a1c 6.1; tsh 1.689; uric acid: 7.2  06-04-21: glucose 95; bun 35; creat 1.40; k+ 4.1; na++ 135; ca 8.4 protein 5.6; albumin 3.3 chol 107; ldl 50; trig 32; hdl 51 06-06-21: iron 31; tibc 249; vitamin D 51.87 06-07-21: wbc 5.0; hgb 8.6; hct 26.9; mcv 191.5 plt 175  07-08-21: hepatitis C nr; urine micro-albumin  54.5 09-26-21: hgb a1c 9.0   10-07-21: glucose 282; bun 47; creat 1.90; k+ 4.3; na++ 137; ca 8.8; gfr 27  TODAY  11-06-21: glucose 104; bun 36; creat 1.77; k+ 4.4; na++ 137; ca  8.4; gfr 29    Review of Systems  Constitutional:  Negative for malaise/fatigue.  Respiratory:  Negative for cough and shortness of breath.   Cardiovascular:  Negative for chest pain, palpitations and leg swelling.  Gastrointestinal:  Negative for abdominal pain, constipation and heartburn.  Musculoskeletal:  Negative for back pain, joint pain and myalgias.  Skin:  Positive for itching.  Neurological:  Negative for dizziness.  Psychiatric/Behavioral:  The patient is not nervous/anxious.     Physical Exam Constitutional:      General: She is not in acute distress.    Appearance: She is well-developed. She is not diaphoretic.  Neck:     Thyroid: No thyromegaly.  Cardiovascular:     Rate and Rhythm: Normal rate and regular rhythm.     Pulses: Normal pulses.     Heart sounds: Normal heart sounds.  Pulmonary:     Effort: Pulmonary effort is normal. No respiratory distress.     Breath sounds: Normal breath sounds.  Abdominal:     General: Bowel sounds are normal. There is no distension.     Palpations: Abdomen is soft.     Tenderness: There is no abdominal tenderness.  Musculoskeletal:        General: Normal range of motion.     Cervical back: Neck supple.     Right lower leg: No edema.     Left lower leg: No edema.  Lymphadenopathy:     Cervical: No cervical adenopathy.  Skin:    General: Skin is warm and dry.     Comments: She has scratch marks present   Neurological:     Mental Status: She is alert. Mental status is at baseline.  Psychiatric:        Mood and Affect: Mood normal.      ASSESSMENT/ PLAN:  TODAY  Pruritis:  will begin sarna lotion twice daily and will monitor    Synthia Innocent NP [facility]   call [phone]

## 2021-11-18 ENCOUNTER — Other Ambulatory Visit (HOSPITAL_COMMUNITY)
Admission: RE | Admit: 2021-11-18 | Discharge: 2021-11-18 | Disposition: A | Discharge status: Home / Self Care | Payer: Medicare Other | Source: Skilled Nursing Facility | Attending: Adult Health | Admitting: Adult Health

## 2021-11-18 DIAGNOSIS — R635 Abnormal weight gain: Secondary | ICD-10-CM | POA: Insufficient documentation

## 2021-11-18 LAB — TSH: TSH: 2.112 u[IU]/mL (ref 0.350–4.500)

## 2021-12-10 DIAGNOSIS — Z23 Encounter for immunization: Secondary | ICD-10-CM | POA: Diagnosis not present

## 2021-12-13 ENCOUNTER — Non-Acute Institutional Stay (SKILLED_NURSING_FACILITY): Payer: Medicare Other | Admitting: Adult Health

## 2021-12-13 ENCOUNTER — Encounter: Payer: Self-pay | Admitting: Adult Health

## 2021-12-13 DIAGNOSIS — I129 Hypertensive chronic kidney disease with stage 1 through stage 4 chronic kidney disease, or unspecified chronic kidney disease: Secondary | ICD-10-CM | POA: Diagnosis not present

## 2021-12-13 DIAGNOSIS — F339 Major depressive disorder, recurrent, unspecified: Secondary | ICD-10-CM | POA: Diagnosis not present

## 2021-12-13 DIAGNOSIS — F0390 Unspecified dementia without behavioral disturbance: Secondary | ICD-10-CM

## 2021-12-13 DIAGNOSIS — N1831 Chronic kidney disease, stage 3a: Secondary | ICD-10-CM

## 2021-12-13 DIAGNOSIS — E1122 Type 2 diabetes mellitus with diabetic chronic kidney disease: Secondary | ICD-10-CM

## 2021-12-13 NOTE — Progress Notes (Signed)
Location:  [place] Room Number: 101/D Place of Service:  SNF (31)   CODE STATUS: dnr   No Known Allergies  Chief Complaint  Patient presents with   Acute Visit    Care plan meeting.    HPI:  We have come together for her care plan meeting. Family present . BIMS 6/15 mood 3/30: nervous at times; decreased energy. She is nonambulatory has had 2 falls without injury. She requires extensive assist with her adls. She is frequently incontinent of bladder and incontinent of bowel. Dietary: weight is 162.8 pounds;regular diet; feeds self>75% of meals  has good appetite.  Therapy: none at this time . Activities: is active; participates. She continues to be followed for her chronic illnesses including: Dementia without behavioral disturbance  Depression recurrent  Hypertension associated with stage 3a chronic kidney disease due to type 2 diabetes mellitus  Past Medical History:  Diagnosis Date   CAD (coronary artery disease)    DM (diabetes mellitus) (HCC)    Glaucoma    HTN (hypertension)    Obesity    Osteoarthritis     Past Surgical History:  Procedure Laterality Date   CHOLECYSTECTOMY     CORONARY STENT PLACEMENT     x3   REFRACTIVE SURGERY     detached retina   TUBAL LIGATION     76 yrs old    Social History   Socioeconomic History   Marital status: Divorced    Spouse name: Not on file   Number of children: 2   Years of education: Not on file   Highest education level: Not on file  Occupational History   Occupation: retired  Tobacco Use   Smoking status: Every Day    Packs/day: 1.00    Types: Cigarettes   Smokeless tobacco: Not on file  Substance and Sexual Activity   Alcohol use: Yes    Comment: rarely   Drug use: Not on file   Sexual activity: Not on file  Other Topics Concern   Not on file  Social History Narrative   Not on file   Social Determinants of Health   Financial Resource Strain: Not on file  Food Insecurity: Not  on file  Transportation Needs: Not on file  Physical Activity: Not on file  Stress: Not on file  Social Connections: Not on file  Intimate Partner Violence: Not on file   Family History  Problem Relation Age of Onset   Stroke Father    Diabetes Sister    Hypertension Sister    Arthritis Other    Heart disease Other    Hypertension Other    Stroke Other    Kidney disease Other    Diabetes Other       VITAL SIGNS BP 134/73   Pulse 84   Temp 98.6 F (37 C)   Resp (!) 22   Ht 5\' 4"  (1.626 m)   Wt 162 lb 12.8 oz (73.8 kg)   SpO2 98%   BMI 27.94 kg/m   Outpatient Encounter Medications as of 12/13/2021  Medication Sig   alendronate (FOSAMAX) 70 MG tablet Take 70 mg by mouth once a week. Take with a full glass of water on an empty stomach.   aspirin 81 MG tablet Take 81 mg by mouth daily.   atorvastatin (LIPITOR) 20 MG tablet Take 20 mg by mouth daily.   cholecalciferol (VITAMIN D3) 25 MCG (1000 UNIT) tablet Take 1,000 Units by mouth daily.   clobetasol  cream (TEMOVATE) 0.05 % Apply to labia/vaginal area twice a week. Once A Day on Mon, Fri   dorzolamide-timolol (COSOPT) 22.3-6.8 MG/ML ophthalmic solution Place 1 drop into both eyes daily. 1 drop both eyes; ophthalmic.   Wait at least 5 minutes between multiple drops in same eye.   ferrous sulfate 325 (65 FE) MG tablet Take 325 mg by mouth daily.   Insulin Glargine (BASAGLAR KWIKPEN) 100 UNIT/ML Inject 15 Units into the skin at bedtime.   latanoprost (XALATAN) 0.005 % ophthalmic solution Place 1 drop into both eyes daily.   lisinopril (ZESTRIL) 5 MG tablet Take 5 mg by mouth daily.   NON FORMULARY Diet:Regular   PRESCRIPTION MEDICATION Endit Skin Protectant Ointment; topical Special Instructions: Apply to vaginal/perineal area bid. Twice A Day   No facility-administered encounter medications on file as of 12/13/2021.     SIGNIFICANT DIAGNOSTIC EXAMS  PREVIOUS   07-25-21: dexa: t score: -2.521  NO NEW EXAMS    PREVIOUS   05-21-21: hgb a1c 6.1; tsh 1.689; uric acid: 7.2  06-04-21: glucose 95; bun 35; creat 1.40; k+ 4.1; na++ 135; ca 8.4 protein 5.6; albumin 3.3 chol 107; ldl 50; trig 32; hdl 51 06-06-21: iron 31; tibc 249; vitamin D 51.87 06-07-21: wbc 5.0; hgb 8.6; hct 26.9; mcv 191.5 plt 175  07-08-21: hepatitis C nr; urine micro-albumin  54.5 09-26-21: hgb a1c 9.0   10-07-21: glucose 282; bun 47; creat 1.90; k+ 4.3; na++ 137; ca 8.8; gfr 27 11-06-21: glucose 104; bun 36; creat 1.77; k+ 4.4; na++ 137; ca 8.4; gfr 29   NO NEW LABS.    Review of Systems  Constitutional:  Negative for malaise/fatigue.  Respiratory:  Negative for cough and shortness of breath.   Cardiovascular:  Negative for chest pain, palpitations and leg swelling.  Gastrointestinal:  Negative for abdominal pain, constipation and heartburn.  Musculoskeletal:  Negative for back pain, joint pain and myalgias.  Skin: Negative.   Neurological:  Negative for dizziness.  Psychiatric/Behavioral:  The patient is not nervous/anxious.     Physical Exam Constitutional:      General: She is not in acute distress.    Appearance: She is well-developed. She is not diaphoretic.  Neck:     Thyroid: No thyromegaly.  Cardiovascular:     Rate and Rhythm: Normal rate and regular rhythm.     Pulses: Normal pulses.     Heart sounds: Normal heart sounds.  Pulmonary:     Effort: Pulmonary effort is normal. No respiratory distress.     Breath sounds: Normal breath sounds.  Abdominal:     General: Bowel sounds are normal. There is no distension.     Palpations: Abdomen is soft.     Tenderness: There is no abdominal tenderness.  Musculoskeletal:        General: Normal range of motion.     Cervical back: Neck supple.     Right lower leg: No edema.     Left lower leg: No edema.  Lymphadenopathy:     Cervical: No cervical adenopathy.  Skin:    General: Skin is warm and dry.  Neurological:     Mental Status: She is alert and oriented to  person, place, and time.  Psychiatric:        Mood and Affect: Mood normal.       ASSESSMENT/ PLAN:  TODAY  Dementia without behavioral disturbance Depression recurrent Hypertension associated with stage 3a chronic kidney disease due to type 2 diabetes mellitus   Will  continue current medications Will continue current plan of care Will continue to monitor her status.   Time spent with patient: 40 minutes: medications; dietary; plan of care    Ok Edwards NP [facility] Adult [facility]  call [phone]

## 2021-12-16 DIAGNOSIS — Z1159 Encounter for screening for other viral diseases: Secondary | ICD-10-CM | POA: Diagnosis not present

## 2021-12-16 DIAGNOSIS — I1310 Hypertensive heart and chronic kidney disease without heart failure, with stage 1 through stage 4 chronic kidney disease, or unspecified chronic kidney disease: Secondary | ICD-10-CM | POA: Diagnosis not present

## 2021-12-19 ENCOUNTER — Non-Acute Institutional Stay (SKILLED_NURSING_FACILITY): Payer: Medicare Other | Admitting: Adult Health

## 2021-12-19 ENCOUNTER — Encounter: Payer: Self-pay | Admitting: Adult Health

## 2021-12-19 DIAGNOSIS — E1122 Type 2 diabetes mellitus with diabetic chronic kidney disease: Secondary | ICD-10-CM

## 2021-12-19 DIAGNOSIS — Z794 Long term (current) use of insulin: Secondary | ICD-10-CM

## 2021-12-19 DIAGNOSIS — N1831 Chronic kidney disease, stage 3a: Secondary | ICD-10-CM | POA: Diagnosis not present

## 2021-12-19 NOTE — Progress Notes (Signed)
Location:  [place] Room Number: 101-D Place of Service:  SNF (31)   CODE STATUS: DNR  No Known Allergies  Chief Complaint  Patient presents with   Acute Visit    Diabetic concerns     HPI:  Her hgb a1c 9.0. she continues to have elevated cbg readings. There are no reports of uncontrolled hunger or thirst. She is presently taking basaglar 15 units nightly .   Past Medical History:  Diagnosis Date   CAD (coronary artery disease)    DM (diabetes mellitus) ([place])    Glaucoma    HTN (hypertension)    Obesity    Osteoarthritis     Past Surgical History:  Procedure Laterality Date   CHOLECYSTECTOMY     CORONARY STENT PLACEMENT     x3   REFRACTIVE SURGERY     detached retina   TUBAL LIGATION     76 yrs old    Social History   Socioeconomic History   Marital status: Divorced    Spouse name: Not on file   Number of children: 2   Years of education: Not on file   Highest education level: Not on file  Occupational History   Occupation: retired  Tobacco Use   Smoking status: Every Day    Packs/day: 1.00    Types: Cigarettes   Smokeless tobacco: Not on file  Vaping Use   Vaping Use: Not on file  Substance and Sexual Activity   Alcohol use: Yes    Comment: rarely   Drug use: Never   Sexual activity: Not on file  Other Topics Concern   Not on file  Social History Narrative   Not on file   Social Determinants of Health   Financial Resource Strain: Not on file  Food Insecurity: Not on file  Transportation Needs: Not on file  Physical Activity: Not on file  Stress: Not on file  Social Connections: Not on file  Intimate Partner Violence: Not on file   Family History  Problem Relation Age of Onset   Stroke Father    Diabetes Sister    Hypertension Sister    Arthritis Other    Heart disease Other    Hypertension Other    Stroke Other    Kidney disease Other    Diabetes Other       VITAL SIGNS BP 125/64   Pulse 60    Temp 97.9 F (36.6 C)   Resp 20   Ht 5\' 4"  (1.626 m)   Wt 162 lb (73.5 kg)   SpO2 98%   BMI 27.81 kg/m   Outpatient Encounter Medications as of 12/19/2021  Medication Sig   alendronate (FOSAMAX) 70 MG tablet Take 70 mg by mouth once a week. Take with a full glass of water on an empty stomach.   aspirin 81 MG tablet Take 81 mg by mouth daily.   atorvastatin (LIPITOR) 20 MG tablet Take 20 mg by mouth daily.   camphor-menthol (SARNA) lotion Apply 1 Application topically 2 (two) times daily.   cholecalciferol (VITAMIN D3) 25 MCG (1000 UNIT) tablet Take 1,000 Units by mouth daily.   clobetasol cream (TEMOVATE) 0.05 % Apply to labia/vaginal area twice a week. Once A Day on Mon, Fri   dorzolamide-timolol (COSOPT) 22.3-6.8 MG/ML ophthalmic solution Place 1 drop into both eyes daily.  Wait at least 5 minutes between multiple drops in same eye.   ferrous sulfate 325 (65 FE) MG tablet Take 325 mg by mouth  daily.   Insulin Glargine (BASAGLAR KWIKPEN) 100 UNIT/ML Inject 15 Units into the skin at bedtime.   latanoprost (XALATAN) 0.005 % ophthalmic solution Place 1 drop into both eyes daily.   NON FORMULARY Diet:Regular   PRESCRIPTION MEDICATION Endit Skin Protectant Ointment; topical Special Instructions: Apply to vaginal/perineal area bid. Twice A Day   [DISCONTINUED] lisinopril (ZESTRIL) 5 MG tablet Take 5 mg by mouth daily.   No facility-administered encounter medications on file as of 12/19/2021.     SIGNIFICANT DIAGNOSTIC EXAMS  PREVIOUS   07-25-21: dexa: t score: -2.521  NO NEW EXAMS   PREVIOUS   05-21-21: hgb a1c 6.1; tsh 1.689; uric acid: 7.2  06-04-21: glucose 95; bun 35; creat 1.40; k+ 4.1; na++ 135; ca 8.4 protein 5.6; albumin 3.3 chol 107; ldl 50; trig 32; hdl 51 06-06-21: iron 31; tibc 249; vitamin D 51.87 06-07-21: wbc 5.0; hgb 8.6; hct 26.9; mcv 191.5 plt 175  07-08-21: hepatitis C nr; urine micro-albumin  54.5 09-26-21: hgb a1c 9.0   10-07-21: glucose 282; bun 47; creat 1.90;  k+ 4.3; na++ 137; ca 8.8; gfr 27 11-06-21: glucose 104; bun 36; creat 1.77; k+ 4.4; na++ 137; ca 8.4; gfr 29   NO NEW LABS.    Review of Systems  Constitutional:  Negative for malaise/fatigue.  Respiratory:  Negative for cough and shortness of breath.   Cardiovascular:  Negative for chest pain, palpitations and leg swelling.  Gastrointestinal:  Negative for abdominal pain, constipation and heartburn.  Musculoskeletal:  Negative for back pain, joint pain and myalgias.  Skin: Negative.   Neurological:  Negative for dizziness.  Psychiatric/Behavioral:  The patient is not nervous/anxious.    Physical Exam Constitutional:      General: She is not in acute distress.    Appearance: She is well-developed. She is not diaphoretic.  Neck:     Thyroid: No thyromegaly.  Cardiovascular:     Rate and Rhythm: Normal rate and regular rhythm.     Pulses: Normal pulses.     Heart sounds: Normal heart sounds.  Pulmonary:     Effort: Pulmonary effort is normal. No respiratory distress.     Breath sounds: Normal breath sounds.  Abdominal:     General: Bowel sounds are normal. There is no distension.     Palpations: Abdomen is soft.     Tenderness: There is no abdominal tenderness.  Musculoskeletal:        General: Normal range of motion.     Cervical back: Neck supple.     Right lower leg: No edema.     Left lower leg: No edema.  Lymphadenopathy:     Cervical: No cervical adenopathy.  Skin:    General: Skin is warm and dry.  Neurological:     Mental Status: She is alert. Mental status is at baseline.  Psychiatric:        Mood and Affect: Mood normal.        ASSESSMENT/ PLAN:  TODAY  Type 2 diabetes mellitus with stage 3a chronic kidney disease with long term current use of insulin hgb a1c 9.0 will recheck hgb a1c; will increase basaglar from 15 units to 20 units nightly    Ok Edwards NP [facility] Adult Medicine  call [phone]

## 2021-12-20 DIAGNOSIS — Z794 Long term (current) use of insulin: Secondary | ICD-10-CM | POA: Insufficient documentation

## 2021-12-20 DIAGNOSIS — E1122 Type 2 diabetes mellitus with diabetic chronic kidney disease: Secondary | ICD-10-CM | POA: Insufficient documentation

## 2021-12-23 ENCOUNTER — Other Ambulatory Visit (HOSPITAL_COMMUNITY)
Admission: RE | Admit: 2021-12-23 | Discharge: 2021-12-23 | Disposition: A | Discharge status: Home / Self Care | Payer: Medicare Other | Source: Skilled Nursing Facility | Attending: Adult Health | Admitting: Adult Health

## 2021-12-23 DIAGNOSIS — E119 Type 2 diabetes mellitus without complications: Secondary | ICD-10-CM | POA: Diagnosis not present

## 2021-12-23 LAB — HEMOGLOBIN A1C
Hgb A1c MFr Bld: 10 % — ABNORMAL HIGH (ref 4.8–5.6)
Mean Plasma Glucose: 240.3 mg/dL

## 2021-12-24 ENCOUNTER — Non-Acute Institutional Stay (SKILLED_NURSING_FACILITY): Payer: Medicare Other | Admitting: Adult Health

## 2021-12-24 DIAGNOSIS — Z794 Long term (current) use of insulin: Secondary | ICD-10-CM | POA: Diagnosis not present

## 2021-12-24 DIAGNOSIS — N1831 Chronic kidney disease, stage 3a: Secondary | ICD-10-CM

## 2021-12-24 DIAGNOSIS — E1122 Type 2 diabetes mellitus with diabetic chronic kidney disease: Secondary | ICD-10-CM

## 2021-12-27 ENCOUNTER — Encounter: Payer: Self-pay | Admitting: Adult Health

## 2021-12-27 NOTE — Progress Notes (Signed)
This encounter was created in error - please disregard.

## 2021-12-27 NOTE — Progress Notes (Signed)
Location:  [place] Room Number: 101 Place of Service:  SNF (31)   CODE STATUS: dnr   No Known Allergies  Chief Complaint  Patient presents with   Acute Visit    Diabetes     HPI:  Her hgb a1c is 10.0. her reading continues to worsen. She does eat anything and everything given to her. There are no reports; however; of excessive hunger or thirst.   Past Medical History:  Diagnosis Date   CAD (coronary artery disease)    DM (diabetes mellitus) ([place])    Glaucoma    HTN (hypertension)    Obesity    Osteoarthritis     Past Surgical History:  Procedure Laterality Date   CHOLECYSTECTOMY     CORONARY STENT PLACEMENT     x3   REFRACTIVE SURGERY     detached retina   TUBAL LIGATION     76 yrs old    Social History   Socioeconomic History   Marital status: Divorced    Spouse name: Not on file   Number of children: 2   Years of education: Not on file   Highest education level: Not on file  Occupational History   Occupation: retired  Tobacco Use   Smoking status: Every Day    Packs/day: 1.00    Types: Cigarettes   Smokeless tobacco: Not on file  Vaping Use   Vaping Use: Not on file  Substance and Sexual Activity   Alcohol use: Yes    Comment: rarely   Drug use: Never   Sexual activity: Not on file  Other Topics Concern   Not on file  Social History Narrative   Not on file   Social Determinants of Health   Financial Resource Strain: Not on file  Food Insecurity: Not on file  Transportation Needs: Not on file  Physical Activity: Not on file  Stress: Not on file  Social Connections: Not on file  Intimate Partner Violence: Not on file   Family History  Problem Relation Age of Onset   Stroke Father    Diabetes Sister    Hypertension Sister    Arthritis Other    Heart disease Other    Hypertension Other    Stroke Other    Kidney disease Other    Diabetes Other       VITAL SIGNS BP (!) 146/50   Pulse (!) 52   Temp  98.1 F (36.7 C)   Ht 5\' 4"  (1.626 m)   Wt 162 lb 12.8 oz (73.8 kg)   BMI 27.94 kg/m   Outpatient Encounter Medications as of 12/24/2021  Medication Sig   alendronate (FOSAMAX) 70 MG tablet Take 70 mg by mouth once a week. Take with a full glass of water on an empty stomach.   aspirin 81 MG tablet Take 81 mg by mouth daily.   atorvastatin (LIPITOR) 20 MG tablet Take 20 mg by mouth daily.   camphor-menthol (SARNA) lotion Apply 1 Application topically 2 (two) times daily.   cholecalciferol (VITAMIN D3) 25 MCG (1000 UNIT) tablet Take 1,000 Units by mouth daily.   clobetasol cream (TEMOVATE) 0.05 % Apply to labia/vaginal area twice a week. Once A Day on Mon, Fri   dorzolamide-timolol (COSOPT) 22.3-6.8 MG/ML ophthalmic solution Place 1 drop into both eyes daily.  Wait at least 5 minutes between multiple drops in same eye.   ferrous sulfate 325 (65 FE) MG tablet Take 325 mg by mouth daily.  Insulin Glargine (BASAGLAR KWIKPEN) 100 UNIT/ML Inject 15 Units into the skin at bedtime.   latanoprost (XALATAN) 0.005 % ophthalmic solution Place 1 drop into both eyes daily.   NON FORMULARY Diet:Regular   PRESCRIPTION MEDICATION Endit Skin Protectant Ointment; topical Special Instructions: Apply to vaginal/perineal area bid. Twice A Day   No facility-administered encounter medications on file as of 12/24/2021.     SIGNIFICANT DIAGNOSTIC EXAMS  PREVIOUS   07-25-21: dexa: t score: -2.521  NO NEW EXAMS   PREVIOUS   05-21-21: hgb a1c 6.1; tsh 1.689; uric acid: 7.2  06-04-21: glucose 95; bun 35; creat 1.40; k+ 4.1; na++ 135; ca 8.4 protein 5.6; albumin 3.3 chol 107; ldl 50; trig 32; hdl 51 06-06-21: iron 31; tibc 249; vitamin D 51.87 06-07-21: wbc 5.0; hgb 8.6; hct 26.9; mcv 191.5 plt 175  07-08-21: hepatitis C nr; urine micro-albumin  54.5 09-26-21: hgb a1c 9.0   10-07-21: glucose 282; bun 47; creat 1.90; k+ 4.3; na++ 137; ca 8.8; gfr 27 11-06-21: glucose 104; bun 36; creat 1.77; k+ 4.4; na++ 137; ca  8.4; gfr 29   TODAY  12-23-21: hgb a1c 10.0    Review of Systems  Constitutional:  Negative for malaise/fatigue.  Respiratory:  Negative for cough and shortness of breath.   Cardiovascular:  Negative for chest pain, palpitations and leg swelling.  Gastrointestinal:  Negative for abdominal pain, constipation and heartburn.  Musculoskeletal:  Negative for back pain, joint pain and myalgias.  Skin: Negative.   Neurological:  Negative for dizziness.  Psychiatric/Behavioral:  The patient is not nervous/anxious.    Physical Exam Constitutional:      General: She is not in acute distress.    Appearance: She is well-developed. She is not diaphoretic.  Neck:     Thyroid: No thyromegaly.  Cardiovascular:     Rate and Rhythm: Normal rate and regular rhythm.     Pulses: Normal pulses.     Heart sounds: Normal heart sounds.  Pulmonary:     Effort: Pulmonary effort is normal. No respiratory distress.     Breath sounds: Normal breath sounds.  Abdominal:     General: Bowel sounds are normal. There is no distension.     Palpations: Abdomen is soft.     Tenderness: There is no abdominal tenderness.  Musculoskeletal:        General: Normal range of motion.     Cervical back: Neck supple.     Right lower leg: No edema.     Left lower leg: No edema.  Lymphadenopathy:     Cervical: No cervical adenopathy.  Skin:    General: Skin is warm and dry.  Neurological:     Mental Status: She is alert. Mental status is at baseline.  Psychiatric:        Mood and Affect: Mood normal.        ASSESSMENT/ PLAN:  TODAY  Type 2 diabetes mellitus with stage 3a chronic kidney disease with long term current use of insulin hgb a1c 10.0 will increase basaglar to 20 units nightly     Ok Edwards NP [facility] Adult Medicine   call [phone]

## 2022-01-02 ENCOUNTER — Non-Acute Institutional Stay (SKILLED_NURSING_FACILITY): Payer: Medicare Other | Admitting: Adult Health

## 2022-01-02 DIAGNOSIS — Z794 Long term (current) use of insulin: Secondary | ICD-10-CM | POA: Diagnosis not present

## 2022-01-02 DIAGNOSIS — N1831 Chronic kidney disease, stage 3a: Secondary | ICD-10-CM

## 2022-01-02 DIAGNOSIS — E1122 Type 2 diabetes mellitus with diabetic chronic kidney disease: Secondary | ICD-10-CM

## 2022-01-03 ENCOUNTER — Encounter: Payer: Self-pay | Admitting: Adult Health

## 2022-01-03 NOTE — Progress Notes (Signed)
This encounter was created in error - please disregard.

## 2022-01-03 NOTE — Progress Notes (Unsigned)
Location:  [facility] Room Number: 101 Place of Service:  SNF (31)   CODE STATUS: dnr   No Known Allergies  Chief Complaint  Patient presents with   Acute Visit    Diabetes     HPI:  She continues to have elevated cbg readings in the 200's. She is presently taking basaglar 20 units nightly. She has a good appetite; does eat snacks. No reports of excessive hunger or thirst.    Past Medical History:  Diagnosis Date   CAD (coronary artery disease)    DM (diabetes mellitus) (HCC)    Glaucoma    HTN (hypertension)    Obesity    Osteoarthritis     Past Surgical History:  Procedure Laterality Date   CHOLECYSTECTOMY     CORONARY STENT PLACEMENT     x3   REFRACTIVE SURGERY     detached retina   TUBAL LIGATION     76 yrs old    Social History   Socioeconomic History   Marital status: Divorced    Spouse name: Not on file   Number of children: 2   Years of education: Not on file   Highest education level: Not on file  Occupational History   Occupation: retired  Tobacco Use   Smoking status: Every Day    Packs/day: 1.00    Types: Cigarettes   Smokeless tobacco: Not on file  Vaping Use   Vaping Use: Not on file  Substance and Sexual Activity   Alcohol use: Yes    Comment: rarely   Drug use: Never   Sexual activity: Not on file  Other Topics Concern   Not on file  Social History Narrative   Not on file   Social Determinants of Health   Financial Resource Strain: Not on file  Food Insecurity: Not on file  Transportation Needs: Not on file  Physical Activity: Not on file  Stress: Not on file  Social Connections: Not on file  Intimate Partner Violence: Not on file   Family History  Problem Relation Age of Onset   Stroke Father    Diabetes Sister    Hypertension Sister    Arthritis Other    Heart disease Other    Hypertension Other    Stroke Other    Kidney disease Other    Diabetes Other       VITAL SIGNS BP 132/70    Pulse 62   Temp (!) 96.9 F (36.1 C)   Resp 18   Ht 5\' 4"  (1.626 m)   Wt 162 lb 12.8 oz (73.8 kg)   SpO2 98%   BMI 27.94 kg/m   Outpatient Encounter Medications as of 01/02/2022  Medication Sig   alendronate (FOSAMAX) 70 MG tablet Take 70 mg by mouth once a week. Take with a full glass of water on an empty stomach.   aspirin 81 MG tablet Take 81 mg by mouth daily.   atorvastatin (LIPITOR) 20 MG tablet Take 20 mg by mouth daily.   camphor-menthol (SARNA) lotion Apply 1 Application topically 2 (two) times daily.   cholecalciferol (VITAMIN D3) 25 MCG (1000 UNIT) tablet Take 1,000 Units by mouth daily.   clobetasol cream (TEMOVATE) 0.05 % Apply to labia/vaginal area twice a week. Once A Day on Mon, Fri   dorzolamide-timolol (COSOPT) 22.3-6.8 MG/ML ophthalmic solution Place 1 drop into both eyes daily.  Wait at least 5 minutes between multiple drops in same eye.   ferrous sulfate 325 (  65 FE) MG tablet Take 325 mg by mouth daily.   Insulin Glargine (BASAGLAR KWIKPEN) 100 UNIT/ML Inject 20 Units into the skin at bedtime.   latanoprost (XALATAN) 0.005 % ophthalmic solution Place 1 drop into both eyes daily.   NON FORMULARY Diet:Regular   PRESCRIPTION MEDICATION Endit Skin Protectant Ointment; topical Special Instructions: Apply to vaginal/perineal area bid. Twice A Day   No facility-administered encounter medications on file as of 01/02/2022.     SIGNIFICANT DIAGNOSTIC EXAMS   PREVIOUS   07-25-21: dexa: t score: -2.521  NO NEW EXAMS   PREVIOUS   05-21-21: hgb a1c 6.1; tsh 1.689; uric acid: 7.2  06-04-21: glucose 95; bun 35; creat 1.40; k+ 4.1; na++ 135; ca 8.4 protein 5.6; albumin 3.3 chol 107; ldl 50; trig 32; hdl 51 06-06-21: iron 31; tibc 249; vitamin D 51.87 06-07-21: wbc 5.0; hgb 8.6; hct 26.9; mcv 191.5 plt 175  07-08-21: hepatitis C nr; urine micro-albumin  54.5 09-26-21: hgb a1c 9.0   10-07-21: glucose 282; bun 47; creat 1.90; k+ 4.3; na++ 137; ca 8.8; gfr 27 11-06-21:  glucose 104; bun 36; creat 1.77; k+ 4.4; na++ 137; ca 8.4; gfr 29 11-18-21: tsh 2.112  12-23-21: hgb a1c 10.0  NO NEW LABS.     Review of Systems  Constitutional:  Negative for malaise/fatigue.  Respiratory:  Negative for cough and shortness of breath.   Cardiovascular:  Negative for chest pain, palpitations and leg swelling.  Gastrointestinal:  Negative for abdominal pain, constipation and heartburn.  Musculoskeletal:  Negative for back pain, joint pain and myalgias.  Skin: Negative.   Neurological:  Negative for dizziness.  Psychiatric/Behavioral:  The patient is not nervous/anxious.    Physical Exam Constitutional:      General: She is not in acute distress.    Appearance: She is well-developed. She is not diaphoretic.  Neck:     Thyroid: No thyromegaly.  Cardiovascular:     Rate and Rhythm: Normal rate and regular rhythm.     Pulses: Normal pulses.     Heart sounds: Normal heart sounds.  Pulmonary:     Effort: Pulmonary effort is normal. No respiratory distress.     Breath sounds: Normal breath sounds.  Abdominal:     General: Bowel sounds are normal. There is no distension.     Palpations: Abdomen is soft.     Tenderness: There is no abdominal tenderness.  Musculoskeletal:        General: Normal range of motion.     Cervical back: Neck supple.     Right lower leg: No edema.     Left lower leg: No edema.  Lymphadenopathy:     Cervical: No cervical adenopathy.  Skin:    General: Skin is warm and dry.  Neurological:     Mental Status: She is alert. Mental status is at baseline.  Psychiatric:        Mood and Affect: Mood normal.         ASSESSMENT/ PLAN:  TODAY  Type 2 diabetes mellitus with stage 3a chronic kidney disease with long term current use of insulin hgb a1c 10.0: will increase basaglar to 25 units nightly     Ok Edwards NP [facility] Adult Medicine   call [phone]

## 2022-01-06 ENCOUNTER — Non-Acute Institutional Stay (SKILLED_NURSING_FACILITY): Payer: Medicare Other | Admitting: Adult Health

## 2022-01-06 DIAGNOSIS — N1831 Chronic kidney disease, stage 3a: Secondary | ICD-10-CM

## 2022-01-06 DIAGNOSIS — Z794 Long term (current) use of insulin: Secondary | ICD-10-CM | POA: Diagnosis not present

## 2022-01-06 DIAGNOSIS — E1122 Type 2 diabetes mellitus with diabetic chronic kidney disease: Secondary | ICD-10-CM | POA: Diagnosis not present

## 2022-01-06 DIAGNOSIS — N183 Chronic kidney disease, stage 3 unspecified: Secondary | ICD-10-CM | POA: Diagnosis not present

## 2022-01-07 ENCOUNTER — Encounter: Payer: Self-pay | Admitting: Adult Health

## 2022-01-07 NOTE — Progress Notes (Unsigned)
Location:  [facility] Room Number: 101 Place of Service:   SNF    CODE STATUS: dnr   No Known Allergies  Chief Complaint  Patient presents with   Acute Visit    Diabetes     HPI:  She continues to have elevated cbg readings. She does eat a lot of snacks. Her glucose was over 400 this AM. She is presently taking lantus 25 units nightly.   Past Medical History:  Diagnosis Date   CAD (coronary artery disease)    DM (diabetes mellitus) ([place])    Glaucoma    HTN (hypertension)    Obesity    Osteoarthritis     Past Surgical History:  Procedure Laterality Date   CHOLECYSTECTOMY     CORONARY STENT PLACEMENT     x3   REFRACTIVE SURGERY     detached retina   TUBAL LIGATION     76 yrs old    Social History   Socioeconomic History   Marital status: Divorced    Spouse name: Not on file   Number of children: 2   Years of education: Not on file   Highest education level: Not on file  Occupational History   Occupation: retired  Tobacco Use   Smoking status: Every Day    Packs/day: 1.00    Types: Cigarettes   Smokeless tobacco: Not on file  Vaping Use   Vaping Use: Not on file  Substance and Sexual Activity   Alcohol use: Yes    Comment: rarely   Drug use: Never   Sexual activity: Not on file  Other Topics Concern   Not on file  Social History Narrative   Not on file   Social Determinants of Health   Financial Resource Strain: Not on file  Food Insecurity: Not on file  Transportation Needs: Not on file  Physical Activity: Not on file  Stress: Not on file  Social Connections: Not on file  Intimate Partner Violence: Not on file   Family History  Problem Relation Age of Onset   Stroke Father    Diabetes Sister    Hypertension Sister    Arthritis Other    Heart disease Other    Hypertension Other    Stroke Other    Kidney disease Other    Diabetes Other       VITAL SIGNS BP (!) 139/57   Pulse 62   Temp 97.7 F (36.5  C)   Resp (!) 22   Ht 5\' 4"  (1.626 m)   Wt 162 lb 12.8 oz (73.8 kg)   SpO2 98%   BMI 27.94 kg/m   Outpatient Encounter Medications as of 01/06/2022  Medication Sig   alendronate (FOSAMAX) 70 MG tablet Take 70 mg by mouth once a week. Take with a full glass of water on an empty stomach.   aspirin 81 MG tablet Take 81 mg by mouth daily.   atorvastatin (LIPITOR) 20 MG tablet Take 20 mg by mouth daily.   camphor-menthol (SARNA) lotion Apply 1 Application topically 2 (two) times daily.   cholecalciferol (VITAMIN D3) 25 MCG (1000 UNIT) tablet Take 1,000 Units by mouth daily.   clobetasol cream (TEMOVATE) 0.05 % Apply to labia/vaginal area twice a week. Once A Day on Mon, Fri   dorzolamide-timolol (COSOPT) 22.3-6.8 MG/ML ophthalmic solution Place 1 drop into both eyes daily.  Wait at least 5 minutes between multiple drops in same eye.   ferrous sulfate 325 (65 FE) MG  tablet Take 325 mg by mouth daily.   Insulin Glargine (BASAGLAR KWIKPEN) 100 UNIT/ML Inject 15 Units into the skin at bedtime.   latanoprost (XALATAN) 0.005 % ophthalmic solution Place 1 drop into both eyes daily.   NON FORMULARY Diet:Regular   PRESCRIPTION MEDICATION Endit Skin Protectant Ointment; topical Special Instructions: Apply to vaginal/perineal area bid. Twice A Day   No facility-administered encounter medications on file as of 01/06/2022.     SIGNIFICANT DIAGNOSTIC EXAMS   PREVIOUS   07-25-21: dexa: t score: -2.521  NO NEW EXAMS   PREVIOUS   05-21-21: hgb a1c 6.1; tsh 1.689; uric acid: 7.2  06-04-21: glucose 95; bun 35; creat 1.40; k+ 4.1; na++ 135; ca 8.4 protein 5.6; albumin 3.3 chol 107; ldl 50; trig 32; hdl 51 06-06-21: iron 31; tibc 249; vitamin D 51.87 06-07-21: wbc 5.0; hgb 8.6; hct 26.9; mcv 191.5 plt 175  07-08-21: hepatitis C nr; urine micro-albumin  54.5 09-26-21: hgb a1c 9.0   10-07-21: glucose 282; bun 47; creat 1.90; k+ 4.3; na++ 137; ca 8.8; gfr 27 11-06-21: glucose 104; bun 36; creat 1.77; k+  4.4; na++ 137; ca 8.4; gfr 29 11-18-21: tsh 2.112  12-23-21: hgb a1c 10.0  NO NEW LABS.    Review of Systems  Constitutional:  Negative for malaise/fatigue.  Respiratory:  Negative for cough and shortness of breath.   Cardiovascular:  Negative for chest pain, palpitations and leg swelling.  Gastrointestinal:  Negative for abdominal pain, constipation and heartburn.  Musculoskeletal:  Negative for back pain, joint pain and myalgias.  Skin: Negative.   Neurological:  Negative for dizziness.  Psychiatric/Behavioral:  The patient is not nervous/anxious.    Physical Exam Constitutional:      General: She is not in acute distress.    Appearance: She is well-developed. She is not diaphoretic.  Neck:     Thyroid: No thyromegaly.  Cardiovascular:     Rate and Rhythm: Normal rate and regular rhythm.     Pulses: Normal pulses.     Heart sounds: Normal heart sounds.  Pulmonary:     Effort: Pulmonary effort is normal. No respiratory distress.     Breath sounds: Normal breath sounds.  Abdominal:     General: Bowel sounds are normal. There is no distension.     Palpations: Abdomen is soft.     Tenderness: There is no abdominal tenderness.  Musculoskeletal:        General: Normal range of motion.     Cervical back: Neck supple.     Right lower leg: No edema.     Left lower leg: No edema.  Lymphadenopathy:     Cervical: No cervical adenopathy.  Skin:    General: Skin is warm and dry.  Neurological:     Mental Status: She is alert. Mental status is at baseline.  Psychiatric:        Mood and Affect: Mood normal.       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   call [phone]

## 2022-01-09 ENCOUNTER — Other Ambulatory Visit (HOSPITAL_COMMUNITY)
Admission: RE | Admit: 2022-01-09 | Discharge: 2022-01-09 | Disposition: A | Discharge status: Home / Self Care | Payer: Medicare Other | Source: Skilled Nursing Facility | Attending: Adult Health | Admitting: Adult Health

## 2022-01-09 DIAGNOSIS — I1310 Hypertensive heart and chronic kidney disease without heart failure, with stage 1 through stage 4 chronic kidney disease, or unspecified chronic kidney disease: Secondary | ICD-10-CM | POA: Insufficient documentation

## 2022-01-09 LAB — COMPREHENSIVE METABOLIC PANEL
ALT: 39 U/L (ref 0–44)
AST: 24 U/L (ref 15–41)
Albumin: 3 g/dL — ABNORMAL LOW (ref 3.5–5.0)
Alkaline Phosphatase: 74 U/L (ref 38–126)
Anion gap: 7 (ref 5–15)
BUN: 42 mg/dL — ABNORMAL HIGH (ref 8–23)
CO2: 24 mmol/L (ref 22–32)
Calcium: 8.5 mg/dL — ABNORMAL LOW (ref 8.9–10.3)
Chloride: 104 mmol/L (ref 98–111)
Creatinine, Ser: 1.74 mg/dL — ABNORMAL HIGH (ref 0.44–1.00)
GFR, Estimated: 30 mL/min — ABNORMAL LOW (ref >60)
Glucose, Bld: 256 mg/dL — ABNORMAL HIGH (ref 70–99)
Potassium: 4.4 mmol/L (ref 3.5–5.1)
Sodium: 135 mmol/L (ref 135–145)
Total Bilirubin: 0.6 mg/dL (ref 0.3–1.2)
Total Protein: 6.2 g/dL — ABNORMAL LOW (ref 6.5–8.1)

## 2022-01-09 LAB — CBC
HCT: 30.1 % — ABNORMAL LOW (ref 36.0–46.0)
Hemoglobin: 10.2 g/dL — ABNORMAL LOW (ref 12.0–15.0)
MCH: 31.7 pg (ref 26.0–34.0)
MCHC: 33.9 g/dL (ref 30.0–36.0)
MCV: 93.5 fL (ref 80.0–100.0)
Platelets: 209 10*3/uL (ref 150–400)
RBC: 3.22 MIL/uL — ABNORMAL LOW (ref 3.87–5.11)
RDW: 13.9 % (ref 11.5–15.5)
WBC: 10 10*3/uL (ref 4.0–10.5)
nRBC: 0 % (ref 0.0–0.2)

## 2022-01-13 ENCOUNTER — Non-Acute Institutional Stay (SKILLED_NURSING_FACILITY): Payer: Medicare Other | Admitting: Adult Health

## 2022-01-13 ENCOUNTER — Encounter: Payer: Self-pay | Admitting: Adult Health

## 2022-01-13 DIAGNOSIS — I129 Hypertensive chronic kidney disease with stage 1 through stage 4 chronic kidney disease, or unspecified chronic kidney disease: Secondary | ICD-10-CM

## 2022-01-13 DIAGNOSIS — Z794 Long term (current) use of insulin: Secondary | ICD-10-CM

## 2022-01-13 DIAGNOSIS — E441 Mild protein-calorie malnutrition: Secondary | ICD-10-CM

## 2022-01-13 DIAGNOSIS — E1122 Type 2 diabetes mellitus with diabetic chronic kidney disease: Secondary | ICD-10-CM | POA: Diagnosis not present

## 2022-01-13 DIAGNOSIS — F0390 Unspecified dementia without behavioral disturbance: Secondary | ICD-10-CM | POA: Diagnosis not present

## 2022-01-13 DIAGNOSIS — F339 Major depressive disorder, recurrent, unspecified: Secondary | ICD-10-CM

## 2022-01-13 DIAGNOSIS — N1831 Chronic kidney disease, stage 3a: Secondary | ICD-10-CM

## 2022-01-13 NOTE — Progress Notes (Unsigned)
Location:  [facility] Room Number: 101 Place of Service:  SNF (31)   CODE STATUS: dnr   No Known Allergies  Chief Complaint  Patient presents with   Medical Management of Chronic Issues                             Dementia without behavioral disturbance: Protein calorie malnutrition:  Chronic depression:  Benign hypertension with CKD (chronic kidney disease) stage III:    HPI:  She is a 76 year old long term resident of this facility being seen for the management of her chronic illnesses:  Dementia without behavioral disturbance: Protein calorie malnutrition:  Chronic depression:  Benign hypertension with CKD (chronic kidney disease) stage III. There have been no reports of recent falls. There are no reports of uncontrolled pain; no reports of anxiety or agitation.   Past Medical History:  Diagnosis Date   CAD (coronary artery disease)    DM (diabetes mellitus) ([facility])    Glaucoma    HTN (hypertension)    Obesity    Osteoarthritis     Past Surgical History:  Procedure Laterality Date   CHOLECYSTECTOMY     CORONARY STENT PLACEMENT     x3   REFRACTIVE SURGERY     detached retina   TUBAL LIGATION     76 yrs old    Social History   Socioeconomic History   Marital status: Divorced    Spouse name: Not on file   Number of children: 2   Years of education: Not on file   Highest education level: Not on file  Occupational History   Occupation: retired  Tobacco Use   Smoking status: Every Day    Packs/day: 1.00    Types: Cigarettes   Smokeless tobacco: Not on file  Vaping Use   Vaping Use: Not on file  Substance and Sexual Activity   Alcohol use: Yes    Comment: rarely   Drug use: Never   Sexual activity: Not on file  Other Topics Concern   Not on file  Social History Narrative   Not on file   Social Determinants of Health   Financial Resource Strain: Not on file  Food Insecurity: Not on file  Transportation Needs: Not on file   Physical Activity: Not on file  Stress: Not on file  Social Connections: Not on file  Intimate Partner Violence: Not on file   Family History  Problem Relation Age of Onset   Stroke Father    Diabetes Sister    Hypertension Sister    Arthritis Other    Heart disease Other    Hypertension Other    Stroke Other    Kidney disease Other    Diabetes Other       VITAL SIGNS BP (!) 151/54   Pulse 63   Temp (!) 97.5 F (36.4 C)   Resp 18   Ht 5\' 4"  (1.626 m)   Wt 171 lb (77.6 kg)   SpO2 93%   BMI 29.35 kg/m   Outpatient Encounter Medications as of 01/13/2022  Medication Sig   alendronate (FOSAMAX) 70 MG tablet Take 70 mg by mouth once a week. Take with a full glass of water on an empty stomach.   aspirin 81 MG tablet Take 81 mg by mouth daily.   atorvastatin (LIPITOR) 20 MG tablet Take 20 mg by mouth daily.   camphor-menthol (SARNA) lotion Apply 1  Application topically 2 (two) times daily.   cholecalciferol (VITAMIN D3) 25 MCG (1000 UNIT) tablet Take 1,000 Units by mouth daily.   clobetasol cream (TEMOVATE) 0.05 % Apply to labia/vaginal area twice a week. Once A Day on Mon, Fri   dorzolamide-timolol (COSOPT) 22.3-6.8 MG/ML ophthalmic solution Place 1 drop into both eyes daily.  Wait at least 5 minutes between multiple drops in same eye.   ferrous sulfate 325 (65 FE) MG tablet Take 325 mg by mouth daily.   insulin aspart (NOVOLOG FLEXPEN) 100 UNIT/ML FlexPen Inject 5 Units into the skin 3 (three) times daily with meals.   Insulin Glargine (BASAGLAR KWIKPEN) 100 UNIT/ML Inject 25 Units into the skin at bedtime.   latanoprost (XALATAN) 0.005 % ophthalmic solution Place 1 drop into both eyes daily.   NON FORMULARY Diet:Regular   PRESCRIPTION MEDICATION Endit Skin Protectant Ointment; topical Special Instructions: Apply to vaginal/perineal area bid. Twice A Day   No facility-administered encounter medications on file as of 01/13/2022.     SIGNIFICANT DIAGNOSTIC  EXAMS  PREVIOUS   07-25-21: dexa: t score: -2.521  NO NEW EXAMS   PREVIOUS   05-21-21: hgb a1c 6.1; tsh 1.689; uric acid: 7.2  06-04-21: glucose 95; bun 35; creat 1.40; k+ 4.1; na++ 135; ca 8.4 protein 5.6; albumin 3.3 chol 107; ldl 50; trig 32; hdl 51 06-06-21: iron 31; tibc 249; vitamin D 51.87 06-07-21: wbc 5.0; hgb 8.6; hct 26.9; mcv 191.5 plt 175  07-08-21: hepatitis C nr; urine micro-albumin  54.5 09-26-21: hgb a1c 9.0   10-07-21: glucose 282; bun 47; creat 1.90; k+ 4.3; na++ 137; ca 8.8; gfr 27 11-06-21: glucose 104; bun 36; creat 1.77; k+ 4.4; na++ 137; ca 8.4; gfr 29 11-18-21: tsh 2.112  12-23-21: hgb a1c 10.0  TODAY  01-09-22: wbc 10.0; hgb 10.2; hct 30.1; mcv 93.5 plt 209; glucose 256; bun 44; creat 1.74; k+ 4.4; na++ 135; ca 8.5; gfr 30; protein 6.2; albumin 3.0    Review of Systems  Constitutional:  Negative for malaise/fatigue.  Respiratory:  Negative for cough and shortness of breath.   Cardiovascular:  Negative for chest pain, palpitations and leg swelling.  Gastrointestinal:  Negative for abdominal pain, constipation and heartburn.  Musculoskeletal:  Negative for back pain, joint pain and myalgias.  Skin: Negative.   Neurological:  Negative for dizziness.  Psychiatric/Behavioral:  The patient is not nervous/anxious.    Physical Exam Constitutional:      General: She is not in acute distress.    Appearance: She is well-developed. She is not diaphoretic.  Neck:     Thyroid: No thyromegaly.  Cardiovascular:     Rate and Rhythm: Normal rate and regular rhythm.     Pulses: Normal pulses.     Heart sounds: Normal heart sounds.  Pulmonary:     Effort: Pulmonary effort is normal. No respiratory distress.     Breath sounds: Normal breath sounds.  Abdominal:     General: Bowel sounds are normal. There is no distension.     Palpations: Abdomen is soft.     Tenderness: There is no abdominal tenderness.  Musculoskeletal:        General: Normal range of motion.      Cervical back: Neck supple.     Right lower leg: No edema.     Left lower leg: No edema.  Lymphadenopathy:     Cervical: No cervical adenopathy.  Skin:    General: Skin is warm and dry.  Neurological:  Mental Status: She is alert. Mental status is at baseline.  Psychiatric:        Mood and Affect: Mood normal.      ASSESSMENT/ PLAN:  TODAY  Dementia without behavioral disturbance: weight is 171 pounds; stable; will monitor   2. Protein calorie malnutrition: protein 6.2 albumin 3.0 will continue supplements as directed   3. Chronic depression: is off remeron due to her history of falls.   4. Benign hypertension with CKD (chronic kidney disease) stage III: b/p 151/54 will continue lisinopril 5 mg daily will monitor her blood pressure; if her readings remain elevated will treat as indicated.   PREVIOUS    5. Post menopausal osteoporosis t score  -2.561 will continue fosamax 70 mg weekly   6. Controlled type 2 diabetes mellitus with stage 3 chronic kidney disease without long term current use of insulin: hgb a1c 10.0; will continue basaglar 25  units nightly; novolog 5 units with meals.    7 Stage 3 chronic kidney disease due to type 2 diabetes mellitus: bun 44; creat 1.74 gfr 30   8. Hyperlipidemia associated with type 2 diabetes mellitus: ldl 50; will continue lipitor 20 mg daily   9. Anemia due to stage 3b chronic kidney disease: hgb 8.6 will continue iron daily   10. Increased intraocular pressure bilateral: will continue cosopt to both eyes and xalatan to both eyes.     Synthia Innocent NP [facility]   call [phone]

## 2022-02-03 ENCOUNTER — Non-Acute Institutional Stay (SKILLED_NURSING_FACILITY): Payer: Medicare Other | Admitting: Adult Health

## 2022-02-03 ENCOUNTER — Encounter: Payer: Self-pay | Admitting: Adult Health

## 2022-02-03 DIAGNOSIS — L309 Dermatitis, unspecified: Secondary | ICD-10-CM

## 2022-02-03 NOTE — Progress Notes (Unsigned)
Location:  [facility] Room Number: 101 Place of Service:  SNF (31)   CODE STATUS: dnr   No Known Allergies  Chief Complaint  Patient presents with   Acute Visit    Rash     HPI:  Staff reports that she has a generalized rash on her trunk and hip areas. The areas are itching her and she has scratched herself raw. She skin is very dry. There are no reports of fevers present.  Past Medical History:  Diagnosis Date   CAD (coronary artery disease)    DM (diabetes mellitus) ([facility])    Glaucoma    HTN (hypertension)    Obesity    Osteoarthritis     Past Surgical History:  Procedure Laterality Date   CHOLECYSTECTOMY     CORONARY STENT PLACEMENT     x3   REFRACTIVE SURGERY     detached retina   TUBAL LIGATION     76 yrs old    Social History   Socioeconomic History   Marital status: Divorced    Spouse name: Not on file   Number of children: 2   Years of education: Not on file   Highest education level: Not on file  Occupational History   Occupation: retired  Tobacco Use   Smoking status: Every Day    Packs/day: 1.00    Types: Cigarettes   Smokeless tobacco: Not on file  Vaping Use   Vaping Use: Not on file  Substance and Sexual Activity   Alcohol use: Yes    Comment: rarely   Drug use: Never   Sexual activity: Not on file  Other Topics Concern   Not on file  Social History Narrative   Not on file   Social Determinants of Health   Financial Resource Strain: Not on file  Food Insecurity: Not on file  Transportation Needs: Not on file  Physical Activity: Not on file  Stress: Not on file  Social Connections: Not on file  Intimate Partner Violence: Not on file   Family History  Problem Relation Age of Onset   Stroke Father    Diabetes Sister    Hypertension Sister    Arthritis Other    Heart disease Other    Hypertension Other    Stroke Other    Kidney disease Other    Diabetes Other       VITAL SIGNS BP 138/68    Pulse 68   Temp 98.5 F (36.9 C)   Resp (!) 22   Ht 5\' 4"  (1.626 m)   Wt 171 lb (77.6 kg)   SpO2 97%   BMI 29.35 kg/m   Outpatient Encounter Medications as of 02/03/2022  Medication Sig   alendronate (FOSAMAX) 70 MG tablet Take 70 mg by mouth once a week. Take with a full glass of water on an empty stomach.   aspirin 81 MG tablet Take 81 mg by mouth daily.   atorvastatin (LIPITOR) 20 MG tablet Take 20 mg by mouth daily.   camphor-menthol (SARNA) lotion Apply 1 Application topically 2 (two) times daily.   cholecalciferol (VITAMIN D3) 25 MCG (1000 UNIT) tablet Take 1,000 Units by mouth daily.   clobetasol cream (TEMOVATE) 0.05 % Apply to labia/vaginal area twice a week. Once A Day on Mon, Fri   dorzolamide-timolol (COSOPT) 22.3-6.8 MG/ML ophthalmic solution Place 1 drop into both eyes daily.  Wait at least 5 minutes between multiple drops in same eye.   ferrous sulfate 325 (  65 FE) MG tablet Take 325 mg by mouth daily.   insulin aspart (NOVOLOG FLEXPEN) 100 UNIT/ML FlexPen Inject 5 Units into the skin 3 (three) times daily with meals.   Insulin Glargine (BASAGLAR KWIKPEN) 100 UNIT/ML Inject 25 Units into the skin at bedtime.   latanoprost (XALATAN) 0.005 % ophthalmic solution Place 1 drop into both eyes daily.   NON FORMULARY Diet:Regular   PRESCRIPTION MEDICATION Endit Skin Protectant Ointment; topical Special Instructions: Apply to vaginal/perineal area bid. Twice A Day   No facility-administered encounter medications on file as of 02/03/2022.     SIGNIFICANT DIAGNOSTIC EXAMS  PREVIOUS   07-25-21: dexa: t score: -2.521  NO NEW EXAMS   PREVIOUS   05-21-21: hgb a1c 6.1; tsh 1.689; uric acid: 7.2  06-04-21: glucose 95; bun 35; creat 1.40; k+ 4.1; na++ 135; ca 8.4 protein 5.6; albumin 3.3 chol 107; ldl 50; trig 32; hdl 51 06-06-21: iron 31; tibc 249; vitamin D 51.87 06-07-21: wbc 5.0; hgb 8.6; hct 26.9; mcv 191.5 plt 175  07-08-21: hepatitis C nr; urine micro-albumin   54.5 09-26-21: hgb a1c 9.0   10-07-21: glucose 282; bun 47; creat 1.90; k+ 4.3; na++ 137; ca 8.8; gfr 27 11-06-21: glucose 104; bun 36; creat 1.77; k+ 4.4; na++ 137; ca 8.4; gfr 29 11-18-21: tsh 2.112  12-23-21: hgb a1c 10.0 01-09-22: wbc 10.0; hgb 10.2; hct 30.1; mcv 93.5 plt 209; glucose 256; bun 44; creat 1.74; k+ 4.4; na++ 135; ca 8.5; gfr 30; protein 6.2; albumin 3.0  NO NEW LABS.     Review of Systems  Constitutional:  Negative for malaise/fatigue.  Respiratory:  Negative for cough and shortness of breath.   Cardiovascular:  Negative for chest pain, palpitations and leg swelling.  Gastrointestinal:  Negative for abdominal pain, constipation and heartburn.  Musculoskeletal:  Negative for back pain, joint pain and myalgias.  Skin:  Positive for itching and rash.  Neurological:  Negative for dizziness.  Psychiatric/Behavioral:  The patient is not nervous/anxious.     Physical Exam Constitutional:      General: She is not in acute distress.    Appearance: She is well-developed. She is not diaphoretic.  Neck:     Thyroid: No thyromegaly.  Cardiovascular:     Rate and Rhythm: Normal rate and regular rhythm.     Pulses: Normal pulses.     Heart sounds: Normal heart sounds.  Pulmonary:     Effort: Pulmonary effort is normal. No respiratory distress.     Breath sounds: Normal breath sounds.  Abdominal:     General: Bowel sounds are normal. There is no distension.     Palpations: Abdomen is soft.     Tenderness: There is no abdominal tenderness.  Musculoskeletal:        General: Normal range of motion.     Cervical back: Neck supple.     Right lower leg: No edema.     Left lower leg: No edema.  Lymphadenopathy:     Cervical: No cervical adenopathy.  Skin:    General: Skin is warm and dry.     Findings: Rash present.     Comments: Generalized rash with scratch marks present. Inflammation present   Neurological:     Mental Status: She is alert. Mental status is at baseline.   Psychiatric:        Mood and Affect: Mood normal.      ASSESSMENT/ PLAN:  TODAY  Dermatitis:  will have staff apply sarna lotion to entire body  twice daily; will begin claritin 10 mg daily for her itching; will begin prednisone 40 mg daily for 5 days for inflammation.      Ok Edwards NP [facility]  call [phone]

## 2022-02-05 DIAGNOSIS — L309 Dermatitis, unspecified: Secondary | ICD-10-CM | POA: Insufficient documentation

## 2022-02-12 ENCOUNTER — Encounter: Payer: Self-pay | Admitting: Internal Medicine

## 2022-02-12 ENCOUNTER — Non-Acute Institutional Stay (SKILLED_NURSING_FACILITY): Payer: Medicare Other | Admitting: Internal Medicine

## 2022-02-12 DIAGNOSIS — D631 Anemia in chronic kidney disease: Secondary | ICD-10-CM | POA: Diagnosis not present

## 2022-02-12 DIAGNOSIS — I129 Hypertensive chronic kidney disease with stage 1 through stage 4 chronic kidney disease, or unspecified chronic kidney disease: Secondary | ICD-10-CM

## 2022-02-12 DIAGNOSIS — N1832 Chronic kidney disease, stage 3b: Secondary | ICD-10-CM | POA: Diagnosis not present

## 2022-02-12 DIAGNOSIS — Z794 Long term (current) use of insulin: Secondary | ICD-10-CM

## 2022-02-12 DIAGNOSIS — N183 Chronic kidney disease, stage 3 unspecified: Secondary | ICD-10-CM

## 2022-02-12 DIAGNOSIS — N1831 Chronic kidney disease, stage 3a: Secondary | ICD-10-CM

## 2022-02-12 DIAGNOSIS — E1122 Type 2 diabetes mellitus with diabetic chronic kidney disease: Secondary | ICD-10-CM

## 2022-02-12 DIAGNOSIS — F0390 Unspecified dementia without behavioral disturbance: Secondary | ICD-10-CM | POA: Diagnosis not present

## 2022-02-12 NOTE — Assessment & Plan Note (Signed)
BP controlled; no indication for antihypertensive medications  

## 2022-02-12 NOTE — Assessment & Plan Note (Signed)
Diabetic control is extremely poor with an A1c of 10% which would be associated with a 60% increased risk of stroke or MI. Unfortunately her dementia precludes dietary compliance. Adjust insulin as per average glucoses.

## 2022-02-12 NOTE — Assessment & Plan Note (Signed)
She is pleasantly demented and unable to provide the date, even the year or the POTUS.  Unfortunately she would not understand the associated cardiovascular or neurovascular risk of an A1c of 10%.

## 2022-02-12 NOTE — Assessment & Plan Note (Signed)
Normochromic, normocytic anemia is serially improved with present H/H of 10.2/30.1.  Continue to monitor.

## 2022-02-12 NOTE — Progress Notes (Signed)
NURSING HOME LOCATION:  [facility] ROOM NUMBER:  101 D  CODE STATUS:  DNR  PCP:  Synthia Innocent NP  This is a nursing facility follow up visit of chronic medical diagnoses & to document compliance with Regulation 483.30 (c) in The Long Term Care Survey Manual Phase 2 which mandates caregiver visit ( visits can alternate among physician, PA or NP as per statutes) within 10 days of 30 days / 60 days/ 90 days post admission to SNF date    Interim medical record and care since last SNF visit was updated with review of diagnostic studies and change in clinical status since last visit were documented.  HPI: She is a permanent resident of facility with medical diagnoses of diabetes with vascular complications, CAD, dyslipidemia, and essential hypertension.  Significant she has had coronary artery stenting x3.  Normochromic, normocytic anemia persist but is serially improved with H/H of 10.2/30.1.  A1c is 10% indicating extremely poor diabetes control.  Labs are current and reveal CKD stage IIIb/stage IV with creatinine 1.74 and GFR of 30.  Serially this is stable to improved.  Review of systems: Dementia invalidated responses.  When she first entered the facility appetite was poor but now staff reports she consumes excess amounts of high carb snacks.  When I saw her today she was eating peppermint candy.  Dementia precludes her understanding the associated risk of an A1c of 10%. She could not give me the date stating "not the fogginess data.  I really do not know but I do not know."  She did not want to guess "20 something? " She could not identify the POTUS again stating "I really do know." When asked if she were having any symptoms she stated that "it could be worse, I am lucky."  She then added "if so, I do not know what they are."  Constitutional: No fever, significant weight change, fatigue  Eyes: No redness, discharge, pain, vision change ENT/mouth: No nasal congestion,   purulent discharge, earache, change in hearing, sore throat  Cardiovascular: No chest pain, palpitations, paroxysmal nocturnal dyspnea, claudication, edema  Respiratory: No cough, sputum production, hemoptysis, DOE, significant snoring, apnea   Gastrointestinal: No heartburn, dysphagia, abdominal pain, nausea /vomiting, rectal bleeding, melena, change in bowels Genitourinary: No dysuria, hematuria, pyuria, incontinence, nocturia Musculoskeletal: No joint stiffness, joint swelling, weakness, pain Dermatologic: No rash, pruritus, change in appearance of skin Neurologic: No dizziness, headache, syncope, seizures, numbness, tingling Psychiatric: No significant anxiety, depression, insomnia, anorexia Endocrine: No change in hair/skin/nails, excessive thirst, excessive hunger, excessive urination  Hematologic/lymphatic: No significant bruising, lymphadenopathy, abnormal bleeding Allergy/immunology: No itchy/watery eyes, significant sneezing, urticaria, angioedema  Physical exam:  Pertinent or positive findings: She appears her stated age and adequately nourished.  She is a dentulous.  Lower lids are puffy.  She has low-grade expiratory rhonchi at the lower lower lobe posteriorly.  First heart sound is increased.  She has a grade 1 systolic murmur at the left sternal border.  Pedal pulses are decreased.  She has 1+ pedal edema at the sock line and over the feet.  She expressed pain in the left hip when I tested her strength in the lower extremities.  The right lower extremity was stronger than the left lower extremity. General appearance: Adequately nourished; no acute distress, increased work of breathing is present.   Lymphatic: No lymphadenopathy about the head, neck, axilla. Eyes: No conjunctival inflammation or lid edema is present. There is no scleral icterus. Ears:  External ear exam shows no significant lesions or deformities.   Nose:  External nasal examination shows no deformity or  inflammation. Nasal mucosa are pink and moist without lesions, exudates Oral exam:  Lips and gums are healthy appearing. There is no oropharyngeal erythema or exudate. Neck:  No thyromegaly, masses, tenderness noted.    Heart:  Normal rate and regular rhythm. S1 and S2 normal without gallop, murmur, click, rub .  Lungs: Chest clear to auscultation without wheezes, rhonchi, rales, rubs. Abdomen: Bowel sounds are normal. Abdomen is soft and nontender with no organomegaly, hernias, masses. GU: Deferred  Extremities:  No cyanosis, clubbing, edema  Neurologic exam : Cn 2-7 intact Strength equal  in upper & lower extremities Balance, Rhomberg, finger to nose testing could not be completed due to clinical state Deep tendon reflexes are equal Skin: Warm & dry w/o tenting. No significant lesions or rash.  See summary under each active problem in the Problem List with associated updated therapeutic plan

## 2022-02-12 NOTE — Assessment & Plan Note (Signed)
CKD is stable as high stage IV/low stage IIIb.  Med list reviewed; no nephrotoxic drugs on record.

## 2022-02-12 NOTE — Patient Instructions (Signed)
See assessment and plan under each diagnosis in the problem list and acutely for this visit 

## 2022-02-17 DIAGNOSIS — B86 Scabies: Secondary | ICD-10-CM | POA: Diagnosis not present

## 2022-03-07 ENCOUNTER — Non-Acute Institutional Stay (SKILLED_NURSING_FACILITY): Payer: Medicare Other | Admitting: Adult Health

## 2022-03-07 ENCOUNTER — Encounter: Payer: Self-pay | Admitting: Adult Health

## 2022-03-07 DIAGNOSIS — I129 Hypertensive chronic kidney disease with stage 1 through stage 4 chronic kidney disease, or unspecified chronic kidney disease: Secondary | ICD-10-CM | POA: Diagnosis not present

## 2022-03-07 DIAGNOSIS — E1122 Type 2 diabetes mellitus with diabetic chronic kidney disease: Secondary | ICD-10-CM

## 2022-03-07 DIAGNOSIS — F0390 Unspecified dementia without behavioral disturbance: Secondary | ICD-10-CM | POA: Diagnosis not present

## 2022-03-07 DIAGNOSIS — N1831 Chronic kidney disease, stage 3a: Secondary | ICD-10-CM

## 2022-03-07 DIAGNOSIS — N1832 Chronic kidney disease, stage 3b: Secondary | ICD-10-CM

## 2022-03-07 DIAGNOSIS — Z794 Long term (current) use of insulin: Secondary | ICD-10-CM

## 2022-03-07 NOTE — Progress Notes (Signed)
Location:  [facility] Room Number: 101-D Place of Service:  SNF (31)   CODE STATUS: DNR  No Known Allergies  Chief Complaint  Patient presents with   Care Plan Meeting    HPI:  We have come together for her care plan meeting. Family present  BIMS 5/15 mood 0/30. She nonambulatory with no falls. She is dependent assist with her adl care. She is incontinent of bladder and bowel. Dietary:  weight is 177.2 pounds; regular diet appetite 50-100%; feeds self after setup. Therapy: none at this time. Activities: attends and stays engaged with activities. She will continue to be followed for her chronic illnesses including:  Hypertension with stage 3b chronic kidney disease due to type 2 diabetes mellitus  Dementia without behavioral disturbance Type 2 diabetes mellitus with stage 3b chronic kidney disease and hypertension with long term current use of insulin. She has been treated for scabies in the past month. She continues to scratch her skin to the point of bleeding; dermatology appointment is pending   Past Medical History:  Diagnosis Date   CAD (coronary artery disease)    DM (diabetes mellitus) (HCC)    Glaucoma    HTN (hypertension)    Obesity    Osteoarthritis     Past Surgical History:  Procedure Laterality Date   CHOLECYSTECTOMY     CORONARY STENT PLACEMENT     x3   REFRACTIVE SURGERY     detached retina   TUBAL LIGATION     76 yrs old    Social History   Socioeconomic History   Marital status: Divorced    Spouse name: Not on file   Number of children: 2   Years of education: Not on file   Highest education level: Not on file  Occupational History   Occupation: retired  Tobacco Use   Smoking status: Every Day    Packs/day: 1.00    Types: Cigarettes   Smokeless tobacco: Not on file  Vaping Use   Vaping Use: Not on file  Substance and Sexual Activity   Alcohol use: Yes    Comment: rarely   Drug use: Never   Sexual activity: Not on  file  Other Topics Concern   Not on file  Social History Narrative   Not on file   Social Determinants of Health   Financial Resource Strain: Not on file  Food Insecurity: Not on file  Transportation Needs: Not on file  Physical Activity: Not on file  Stress: Not on file  Social Connections: Not on file  Intimate Partner Violence: Not on file   Family History  Problem Relation Age of Onset   Stroke Father    Diabetes Sister    Hypertension Sister    Arthritis Other    Heart disease Other    Hypertension Other    Stroke Other    Kidney disease Other    Diabetes Other       VITAL SIGNS BP (!) 145/76   Pulse 74   Temp 98.6 F (37 C)   Resp 20   Ht 5\' 4"  (1.626 m)   Wt 177 lb 3.2 oz (80.4 kg)   SpO2 98%   BMI 30.42 kg/m   Outpatient Encounter Medications as of 03/07/2022  Medication Sig   alendronate (FOSAMAX) 70 MG tablet Take 70 mg by mouth once a week. Take with a full glass of water on an empty stomach.   aspirin 81 MG tablet Take 81 mg by  mouth daily.   atorvastatin (LIPITOR) 20 MG tablet Take 20 mg by mouth daily.   camphor-menthol (SARNA) lotion Apply 1 Application topically 2 (two) times daily.   cholecalciferol (VITAMIN D3) 25 MCG (1000 UNIT) tablet Take 1,000 Units by mouth daily.   clobetasol cream (TEMOVATE) 0.05 % Apply to labia/vaginal area twice a week. Once A Day on Mon, Fri   dorzolamide-timolol (COSOPT) 22.3-6.8 MG/ML ophthalmic solution Place 1 drop into both eyes daily.  Wait at least 5 minutes between multiple drops in same eye.   ferrous sulfate 325 (65 FE) MG tablet Take 325 mg by mouth daily.   hydrocortisone 1 % ointment Apply 1 Application topically 3 (three) times daily.   insulin aspart (NOVOLOG FLEXPEN) 100 UNIT/ML FlexPen Inject 5 Units into the skin 3 (three) times daily with meals.   Insulin Glargine (BASAGLAR KWIKPEN) 100 UNIT/ML Inject 25 Units into the skin at bedtime.   latanoprost (XALATAN) 0.005 % ophthalmic solution Place 1  drop into both eyes daily.   loratadine (CLARITIN) 10 MG tablet Take 10 mg by mouth daily.   NON FORMULARY Diet:Regular   PRESCRIPTION MEDICATION Endit Skin Protectant Ointment; topical Special Instructions: Apply to vaginal/perineal area bid. Twice A Day   No facility-administered encounter medications on file as of 03/07/2022.     SIGNIFICANT DIAGNOSTIC EXAMS  PREVIOUS   07-25-21: dexa: t score: -2.521  NO NEW EXAMS   PREVIOUS   05-21-21: hgb a1c 6.1; tsh 1.689; uric acid: 7.2  06-04-21: glucose 95; bun 35; creat 1.40; k+ 4.1; na++ 135; ca 8.4 protein 5.6; albumin 3.3 chol 107; ldl 50; trig 32; hdl 51 06-06-21: iron 31; tibc 249; vitamin D 51.87 06-07-21: wbc 5.0; hgb 8.6; hct 26.9; mcv 191.5 plt 175  07-08-21: hepatitis C nr; urine micro-albumin  54.5 09-26-21: hgb a1c 9.0   10-07-21: glucose 282; bun 47; creat 1.90; k+ 4.3; na++ 137; ca 8.8; gfr 27 11-06-21: glucose 104; bun 36; creat 1.77; k+ 4.4; na++ 137; ca 8.4; gfr 29 11-18-21: tsh 2.112  12-23-21: hgb a1c 10.0 01-09-22: wbc 10.0; hgb 10.2; hct 30.1; mcv 93.5 plt 209; glucose 256; bun 44; creat 1.74; k+ 4.4; na++ 135; ca 8.5; gfr 30; protein 6.2; albumin 3.0  NO NEW LABS.     Review of Systems  Constitutional:  Negative for malaise/fatigue.  Respiratory:  Negative for cough and shortness of breath.   Cardiovascular:  Negative for chest pain, palpitations and leg swelling.  Gastrointestinal:  Negative for abdominal pain, constipation and heartburn.  Musculoskeletal:  Negative for back pain, joint pain and myalgias.  Skin:  Positive for itching and rash.  Neurological:  Negative for dizziness.  Psychiatric/Behavioral:  The patient is not nervous/anxious.     Physical Exam Constitutional:      General: She is not in acute distress.    Appearance: She is well-developed. She is not diaphoretic.  Neck:     Thyroid: No thyromegaly.  Cardiovascular:     Rate and Rhythm: Normal rate and regular rhythm.     Pulses: Normal  pulses.     Heart sounds: Normal heart sounds.  Pulmonary:     Effort: Pulmonary effort is normal. No respiratory distress.     Breath sounds: Normal breath sounds.  Abdominal:     General: Bowel sounds are normal. There is no distension.     Palpations: Abdomen is soft.     Tenderness: There is no abdominal tenderness.  Musculoskeletal:        General:  Normal range of motion.     Cervical back: Neck supple.     Right lower leg: Edema present.     Left lower leg: Edema present.  Lymphadenopathy:     Cervical: No cervical adenopathy.  Skin:    General: Skin is warm and dry.     Comments: Generalized rash with scratch marks present. Inflammation present  Neurological:     Mental Status: She is alert. Mental status is at baseline.  Psychiatric:        Mood and Affect: Mood normal.      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    Synthia Innocent NP [facility]  call [phone]

## 2022-03-11 ENCOUNTER — Non-Acute Institutional Stay (SKILLED_NURSING_FACILITY): Payer: Medicare Other | Admitting: Adult Health

## 2022-03-11 ENCOUNTER — Encounter: Payer: Self-pay | Admitting: Adult Health

## 2022-03-11 DIAGNOSIS — M810 Age-related osteoporosis without current pathological fracture: Secondary | ICD-10-CM

## 2022-03-11 DIAGNOSIS — N183 Chronic kidney disease, stage 3 unspecified: Secondary | ICD-10-CM

## 2022-03-11 DIAGNOSIS — N1832 Chronic kidney disease, stage 3b: Secondary | ICD-10-CM

## 2022-03-11 DIAGNOSIS — Z794 Long term (current) use of insulin: Secondary | ICD-10-CM

## 2022-03-11 DIAGNOSIS — E1122 Type 2 diabetes mellitus with diabetic chronic kidney disease: Secondary | ICD-10-CM | POA: Diagnosis not present

## 2022-03-11 NOTE — Progress Notes (Signed)
Location:  [facility] Room Number: N101D Place of Service:  SNF (31)   CODE STATUS: DNR  No Known Allergies  Chief Complaint  Patient presents with   Medical Management of Chronic Issues              Post menopausal osteoporosis:  Controlled type 2 diabetes mellitus with stage 3 chronic kidney disease with long term current use of insulin: Stage 3 chronic kidney disease due to type 2 diabetes mellitus    HPI:  She is a 77 year old long term resident of this facility being seen for the management of her chronic illnesses: Post menopausal osteoporosis:  Controlled type 2 diabetes mellitus with stage 3 chronic kidney disease with long term current use of insulin: Stage 3 chronic kidney disease due to type 2 diabetes mellitus. She is without uncontrolled pain. She has a dermatology appointment pending.   Past Medical History:  Diagnosis Date   CAD (coronary artery disease)    DM (diabetes mellitus) ([place])    Glaucoma    HTN (hypertension)    Obesity    Osteoarthritis     Past Surgical History:  Procedure Laterality Date   CHOLECYSTECTOMY     CORONARY STENT PLACEMENT     x3   REFRACTIVE SURGERY     detached retina   TUBAL LIGATION     77 yrs old    Social History   Socioeconomic History   Marital status: Divorced    Spouse name: Not on file   Number of children: 2   Years of education: Not on file   Highest education level: Not on file  Occupational History   Occupation: retired  Tobacco Use   Smoking status: Every Day    Packs/day: 1.00    Types: Cigarettes   Smokeless tobacco: Not on file  Vaping Use   Vaping Use: Not on file  Substance and Sexual Activity   Alcohol use: Yes    Comment: rarely   Drug use: Never   Sexual activity: Not on file  Other Topics Concern   Not on file  Social History Narrative   Not on file   Social Determinants of Health   Financial Resource Strain: Not on file  Food Insecurity: Not on file   Transportation Needs: Not on file  Physical Activity: Not on file  Stress: Not on file  Social Connections: Not on file  Intimate Partner Violence: Not on file   Family History  Problem Relation Age of Onset   Stroke Father    Diabetes Sister    Hypertension Sister    Arthritis Other    Heart disease Other    Hypertension Other    Stroke Other    Kidney disease Other    Diabetes Other       VITAL SIGNS BP (!) 137/52   Pulse 76   Temp (!) 97.5 F (36.4 C)   Resp 12   Ht 5\' 4"  (1.626 m)   Wt 177 lb 3.2 oz (80.4 kg)   SpO2 95%   BMI 30.42 kg/m   Outpatient Encounter Medications as of 03/11/2022  Medication Sig   acetaminophen (TYLENOL) 650 MG CR tablet Take 650 mg by mouth every 6 (six) hours as needed for pain.   alendronate (FOSAMAX) 70 MG tablet Take 70 mg by mouth once a week. Take with a full glass of water on an empty stomach.   aspirin 81 MG tablet Take 81 mg by mouth  daily.   atorvastatin (LIPITOR) 20 MG tablet Take 20 mg by mouth daily.   camphor-menthol (SARNA) lotion Apply 1 Application topically 2 (two) times daily.   cholecalciferol (VITAMIN D3) 25 MCG (1000 UNIT) tablet Take 1,000 Units by mouth daily.   clobetasol cream (TEMOVATE) 0.05 % Apply to labia/vaginal area twice a week. Once A Day on Mon, Fri   dorzolamide-timolol (COSOPT) 22.3-6.8 MG/ML ophthalmic solution Place 1 drop into both eyes daily.  Wait at least 5 minutes between multiple drops in same eye.   ferrous sulfate 325 (65 FE) MG tablet Take 325 mg by mouth daily.   hydrocortisone 1 % ointment Apply 1 Application topically 3 (three) times daily.   Insulin Glargine (BASAGLAR KWIKPEN) 100 UNIT/ML Inject 25 Units into the skin at bedtime.   insulin lispro (HUMALOG KWIKPEN) 100 UNIT/ML KwikPen Inject 5 units subcutaneous with meals.   latanoprost (XALATAN) 0.005 % ophthalmic solution Place 1 drop into both eyes daily.   loratadine (CLARITIN) 10 MG tablet Take 10 mg by mouth daily.   NON FORMULARY  Diet:Regular   PRESCRIPTION MEDICATION Endit Skin Protectant Ointment; topical Special Instructions: Apply to vaginal/perineal area bid. Twice A Day   [DISCONTINUED] insulin aspart (NOVOLOG FLEXPEN) 100 UNIT/ML FlexPen Inject 5 Units into the skin 3 (three) times daily with meals.   No facility-administered encounter medications on file as of 03/11/2022.     SIGNIFICANT DIAGNOSTIC EXAMS  PREVIOUS   07-25-21: dexa: t score: -2.521  NO NEW EXAMS   PREVIOUS   05-21-21: hgb a1c 6.1; tsh 1.689; uric acid: 7.2  06-04-21: glucose 95; bun 35; creat 1.40; k+ 4.1; na++ 135; ca 8.4 protein 5.6; albumin 3.3 chol 107; ldl 50; trig 32; hdl 51 06-06-21: iron 31; tibc 249; vitamin D 51.87 06-07-21: wbc 5.0; hgb 8.6; hct 26.9; mcv 191.5 plt 175  07-08-21: hepatitis C nr; urine micro-albumin  54.5 09-26-21: hgb a1c 9.0   10-07-21: glucose 282; bun 47; creat 1.90; k+ 4.3; na++ 137; ca 8.8; gfr 27 11-06-21: glucose 104; bun 36; creat 1.77; k+ 4.4; na++ 137; ca 8.4; gfr 29 11-18-21: tsh 2.112  12-23-21: hgb a1c 10.0 01-09-22: wbc 10.0; hgb 10.2; hct 30.1; mcv 93.5 plt 209; glucose 256; bun 44; creat 1.74; k+ 4.4; na++ 135; ca 8.5; gfr 30; protein 6.2; albumin 3.0   NO NEW LABS.    Review of Systems  Constitutional:  Negative for malaise/fatigue.  Respiratory:  Negative for cough and shortness of breath.   Cardiovascular:  Negative for chest pain, palpitations and leg swelling.  Gastrointestinal:  Negative for abdominal pain, constipation and heartburn.  Musculoskeletal:  Negative for back pain, joint pain and myalgias.  Skin:  Positive for itching.  Neurological:  Negative for dizziness.  Psychiatric/Behavioral:  The patient is not nervous/anxious.    Physical Exam Constitutional:      General: She is not in acute distress.    Appearance: She is well-developed. She is not diaphoretic.  Neck:     Thyroid: No thyromegaly.  Cardiovascular:     Rate and Rhythm: Normal rate and regular rhythm.      Pulses: Normal pulses.     Heart sounds: Normal heart sounds.  Pulmonary:     Effort: Pulmonary effort is normal. No respiratory distress.     Breath sounds: Normal breath sounds.  Abdominal:     General: Bowel sounds are normal. There is no distension.     Palpations: Abdomen is soft.     Tenderness: There is no  abdominal tenderness.  Musculoskeletal:        General: Normal range of motion.     Cervical back: Neck supple.     Right lower leg: Edema present.     Left lower leg: Edema present.  Lymphadenopathy:     Cervical: No cervical adenopathy.  Skin:    General: Skin is warm and dry.  Neurological:     Mental Status: She is alert. Mental status is at baseline.  Psychiatric:        Mood and Affect: Mood normal.      ASSESSMENT/ PLAN:  TODAY  Post menopausal osteoporosis: t score -2.561 is on fosamax 70 mg weekly   2. Controlled type 2 diabetes mellitus with stage 3 chronic kidney disease with long term current use of insulin: will continue basaglar 25 units nightly and novolog 5 units with meals.   3. Stage 3 chronic kidney disease due to type 2 diabetes mellitus: bun 44; creat 1.74; gfr 30   PREVIOUS   4. Hyperlipidemia associated with type 2 diabetes mellitus: ldl 50; will continue lipitor 20 mg daily   5. Anemia due to stage 3b chronic kidney disease: hgb 8.6 will continue iron daily   6 Increased intraocular pressure bilateral: will continue cosopt to both eyes and xalatan to both eyes.   7. Dementia without behavioral disturbance: weight is 177 pounds; stable; will monitor   8. Protein calorie malnutrition: protein 6.2 albumin 3.0 will continue supplements as directed   9. Chronic depression: is off remeron due to her history of falls.   10. Benign hypertension with CKD (chronic kidney disease) stage III: b/p 137/52 will continue lisinopril 5 mg daily will monitor    Synthia Innocent NP [facility]   call [phone]

## 2022-03-12 ENCOUNTER — Encounter: Payer: Self-pay | Admitting: Adult Health

## 2022-03-12 NOTE — Progress Notes (Signed)
Location:  [place] Room Number: N101D Place of Service:  SNF (31)   CODE STATUS: DNR  No Known Allergies  Chief Complaint  Patient presents with   Acute Visit    Edema    HPI:    Past Medical History:  Diagnosis Date   CAD (coronary artery disease)    DM (diabetes mellitus) ([place])    Glaucoma    HTN (hypertension)    Obesity    Osteoarthritis     Past Surgical History:  Procedure Laterality Date   CHOLECYSTECTOMY     CORONARY STENT PLACEMENT     x3   REFRACTIVE SURGERY     detached retina   TUBAL LIGATION     77 yrs old    Social History   Socioeconomic History   Marital status: Divorced    Spouse name: Not on file   Number of children: 2   Years of education: Not on file   Highest education level: Not on file  Occupational History   Occupation: retired  Tobacco Use   Smoking status: Every Day    Packs/day: 1.00    Types: Cigarettes   Smokeless tobacco: Not on file  Vaping Use   Vaping Use: Not on file  Substance and Sexual Activity   Alcohol use: Yes    Comment: rarely   Drug use: Never   Sexual activity: Not on file  Other Topics Concern   Not on file  Social History Narrative   Not on file   Social Determinants of Health   Financial Resource Strain: Not on file  Food Insecurity: Not on file  Transportation Needs: Not on file  Physical Activity: Not on file  Stress: Not on file  Social Connections: Not on file  Intimate Partner Violence: Not on file   Family History  Problem Relation Age of Onset   Stroke Father    Diabetes Sister    Hypertension Sister    Arthritis Other    Heart disease Other    Hypertension Other    Stroke Other    Kidney disease Other    Diabetes Other       VITAL SIGNS BP (!) 137/52   Pulse 76   Temp (!) 97.5 F (36.4 C)   Resp 12   Ht 5\' 4"  (1.626 m)   Wt 177 lb 3.2 oz (80.4 kg)   SpO2 95%   BMI 30.42 kg/m   Outpatient Encounter Medications as of 03/12/2022   Medication Sig   acetaminophen (TYLENOL) 650 MG CR tablet Take 650 mg by mouth every 6 (six) hours as needed for pain.   alendronate (FOSAMAX) 70 MG tablet Take 70 mg by mouth once a week. Take with a full glass of water on an empty stomach.   aspirin 81 MG tablet Take 81 mg by mouth daily.   atorvastatin (LIPITOR) 20 MG tablet Take 20 mg by mouth daily.   camphor-menthol (SARNA) lotion Apply 1 Application topically 2 (two) times daily.   cholecalciferol (VITAMIN D3) 25 MCG (1000 UNIT) tablet Take 1,000 Units by mouth daily.   clobetasol cream (TEMOVATE) 0.05 % Apply to labia/vaginal area twice a week. Once A Day on Mon, Fri   dorzolamide-timolol (COSOPT) 22.3-6.8 MG/ML ophthalmic solution Place 1 drop into both eyes daily.  Wait at least 5 minutes between multiple drops in same eye.   ferrous sulfate 325 (65 FE) MG tablet Take 325 mg by mouth daily.   hydrocortisone 1 %  ointment Apply 1 Application topically 3 (three) times daily.   Insulin Glargine (BASAGLAR KWIKPEN) 100 UNIT/ML Inject 25 Units into the skin at bedtime.   insulin lispro (HUMALOG KWIKPEN) 100 UNIT/ML KwikPen Inject 5 units subcutaneous with meals.   latanoprost (XALATAN) 0.005 % ophthalmic solution Place 1 drop into both eyes daily.   loratadine (CLARITIN) 10 MG tablet Take 10 mg by mouth daily.   NON FORMULARY Diet:Regular   PRESCRIPTION MEDICATION Endit Skin Protectant Ointment; topical Special Instructions: Apply to vaginal/perineal area bid. Twice A Day   No facility-administered encounter medications on file as of 03/12/2022.     SIGNIFICANT DIAGNOSTIC EXAMS       ASSESSMENT/ PLAN:     Ok Edwards NP [facility]  Contact [phone] Monday through Friday 8am- 5pm  After hours call [phone]

## 2022-03-17 DIAGNOSIS — Z794 Long term (current) use of insulin: Secondary | ICD-10-CM | POA: Diagnosis not present

## 2022-03-17 DIAGNOSIS — Z20828 Contact with and (suspected) exposure to other viral communicable diseases: Secondary | ICD-10-CM | POA: Diagnosis not present

## 2022-03-17 DIAGNOSIS — M2142 Flat foot [pes planus] (acquired), left foot: Secondary | ICD-10-CM | POA: Diagnosis not present

## 2022-03-17 DIAGNOSIS — I739 Peripheral vascular disease, unspecified: Secondary | ICD-10-CM | POA: Diagnosis not present

## 2022-03-17 DIAGNOSIS — B351 Tinea unguium: Secondary | ICD-10-CM | POA: Diagnosis not present

## 2022-03-20 DIAGNOSIS — B80 Enterobiasis: Secondary | ICD-10-CM | POA: Diagnosis not present

## 2022-03-20 DIAGNOSIS — B86 Scabies: Secondary | ICD-10-CM | POA: Diagnosis not present

## 2022-03-20 NOTE — Progress Notes (Signed)
This encounter was created in error - please disregard.

## 2022-04-10 ENCOUNTER — Encounter: Payer: Self-pay | Admitting: Adult Health

## 2022-04-10 ENCOUNTER — Non-Acute Institutional Stay (SKILLED_NURSING_FACILITY): Payer: Medicare Other | Admitting: Adult Health

## 2022-04-10 DIAGNOSIS — E1169 Type 2 diabetes mellitus with other specified complication: Secondary | ICD-10-CM

## 2022-04-10 DIAGNOSIS — H40053 Ocular hypertension, bilateral: Secondary | ICD-10-CM | POA: Diagnosis not present

## 2022-04-10 DIAGNOSIS — E785 Hyperlipidemia, unspecified: Secondary | ICD-10-CM

## 2022-04-10 DIAGNOSIS — N1832 Chronic kidney disease, stage 3b: Secondary | ICD-10-CM | POA: Diagnosis not present

## 2022-04-10 DIAGNOSIS — D631 Anemia in chronic kidney disease: Secondary | ICD-10-CM

## 2022-04-10 NOTE — Progress Notes (Signed)
Location:  [facility] Room Number: 101-D Place of Service:  SNF (31)   CODE STATUS: DNR  No Known Allergies  Chief Complaint  Patient presents with   Medical Management of Chronic Issues                      Hyperlipidemia associated with type 2 diabetes mellitus: Anemia due to stage 3b chronic kidney disease: . Increased intraocular pressure bilateral:     HPI:  She is a 77 year old long term resident of this facility being seen for the management of her chronic illnesses: Hyperlipidemia associated with type 2 diabetes mellitus: Anemia due to stage 3b chronic kidney disease: . Increased intraocular pressure bilateral. There are no reports of uncontrolled pain. She does socialize with others. Her weight is stable; no reports of headaches.   Past Medical History:  Diagnosis Date   CAD (coronary artery disease)    DM (diabetes mellitus) ([place])    Glaucoma    HTN (hypertension)    Obesity    Osteoarthritis     Past Surgical History:  Procedure Laterality Date   CHOLECYSTECTOMY     CORONARY STENT PLACEMENT     x3   REFRACTIVE SURGERY     detached retina   TUBAL LIGATION     77 yrs old    Social History   Socioeconomic History   Marital status: Divorced    Spouse name: Not on file   Number of children: 2   Years of education: Not on file   Highest education level: Not on file  Occupational History   Occupation: retired  Tobacco Use   Smoking status: Every Day    Packs/day: 1.00    Types: Cigarettes   Smokeless tobacco: Not on file  Vaping Use   Vaping Use: Not on file  Substance and Sexual Activity   Alcohol use: Yes    Comment: rarely   Drug use: Never   Sexual activity: Not on file  Other Topics Concern   Not on file  Social History Narrative   Not on file   Social Determinants of Health   Financial Resource Strain: Not on file  Food Insecurity: Not on file  Transportation Needs: Not on file  Physical Activity: Not on file   Stress: Not on file  Social Connections: Not on file  Intimate Partner Violence: Not on file   Family History  Problem Relation Age of Onset   Stroke Father    Diabetes Sister    Hypertension Sister    Arthritis Other    Heart disease Other    Hypertension Other    Stroke Other    Kidney disease Other    Diabetes Other       VITAL SIGNS BP (!) 137/54   Pulse (!) 56   Temp 98 F (36.7 C)   Resp 18   Ht 5\' 4"  (1.626 m)   Wt 186 lb 9.6 oz (84.6 kg)   SpO2 97%   BMI 32.03 kg/m   Outpatient Encounter Medications as of 04/10/2022  Medication Sig   acetaminophen (TYLENOL) 650 MG CR tablet Take 650 mg by mouth every 6 (six) hours as needed for pain.   alendronate (FOSAMAX) 70 MG tablet Take 70 mg by mouth once a week. Take with a full glass of water on an empty stomach.   aspirin 81 MG tablet Take 81 mg by mouth daily.   atorvastatin (LIPITOR) 20 MG tablet  Take 20 mg by mouth daily.   camphor-menthol (SARNA) lotion Apply 1 Application topically 2 (two) times daily.   cholecalciferol (VITAMIN D3) 25 MCG (1000 UNIT) tablet Take 1,000 Units by mouth daily.   clobetasol cream (TEMOVATE) 0.05 % Apply to labia/vaginal area twice a week. Once A Day on Mon, Fri   dorzolamide-timolol (COSOPT) 22.3-6.8 MG/ML ophthalmic solution Place 1 drop into both eyes daily.  Wait at least 5 minutes between multiple drops in same eye.   ferrous sulfate 325 (65 FE) MG tablet Take 325 mg by mouth daily.   hydrocortisone 1 % ointment Apply 1 Application topically 3 (three) times daily.   Insulin Glargine (BASAGLAR KWIKPEN) 100 UNIT/ML Inject 25 Units into the skin at bedtime.   insulin lispro (HUMALOG KWIKPEN) 100 UNIT/ML KwikPen Inject 5 units subcutaneous with meals.   latanoprost (XALATAN) 0.005 % ophthalmic solution Place 1 drop into both eyes daily.   loratadine (CLARITIN) 10 MG tablet Take 10 mg by mouth daily.   NON FORMULARY Diet:Regular   PRESCRIPTION MEDICATION Endit Skin Protectant  Ointment; topical Special Instructions: Apply to vaginal/perineal area bid. Twice A Day   No facility-administered encounter medications on file as of 04/10/2022.     SIGNIFICANT DIAGNOSTIC EXAMS  PREVIOUS   07-25-21: dexa: t score: -2.521  NO NEW EXAMS   PREVIOUS   05-21-21: hgb a1c 6.1; tsh 1.689; uric acid: 7.2  06-04-21: glucose 95; bun 35; creat 1.40; k+ 4.1; na++ 135; ca 8.4 protein 5.6; albumin 3.3 chol 107; ldl 50; trig 32; hdl 51 06-06-21: iron 31; tibc 249; vitamin D 51.87 06-07-21: wbc 5.0; hgb 8.6; hct 26.9; mcv 191.5 plt 175  07-08-21: hepatitis C nr; urine micro-albumin  54.5 09-26-21: hgb a1c 9.0   10-07-21: glucose 282; bun 47; creat 1.90; k+ 4.3; na++ 137; ca 8.8; gfr 27 11-06-21: glucose 104; bun 36; creat 1.77; k+ 4.4; na++ 137; ca 8.4; gfr 29 11-18-21: tsh 2.112  12-23-21: hgb a1c 10.0 01-09-22: wbc 10.0; hgb 10.2; hct 30.1; mcv 93.5 plt 209; glucose 256; bun 44; creat 1.74; k+ 4.4; na++ 135; ca 8.5; gfr 30; protein 6.2; albumin 3.0   NO NEW LABS.    Review of Systems  Constitutional:  Negative for malaise/fatigue.  Respiratory:  Negative for cough and shortness of breath.   Cardiovascular:  Negative for chest pain, palpitations and leg swelling.  Gastrointestinal:  Negative for abdominal pain, constipation and heartburn.  Musculoskeletal:  Negative for back pain, joint pain and myalgias.  Skin: Negative.   Neurological:  Negative for dizziness.  Psychiatric/Behavioral:  The patient is not nervous/anxious.    Physical Exam Constitutional:      General: She is not in acute distress.    Appearance: She is obese. She is not diaphoretic.  Eyes:     Conjunctiva/sclera: Conjunctivae normal.  Neck:     Thyroid: No thyromegaly.     Vascular: No JVD.  Cardiovascular:     Rate and Rhythm: Normal rate and regular rhythm.     Pulses: Normal pulses.  Pulmonary:     Effort: Pulmonary effort is normal. No respiratory distress.     Breath sounds: Normal breath sounds. No  wheezing.  Abdominal:     General: Bowel sounds are normal. There is no distension.     Palpations: Abdomen is soft.     Tenderness: There is no abdominal tenderness.  Musculoskeletal:     Cervical back: Neck supple.     Right lower leg: No edema.  Left lower leg: No edema.     Comments: Able to move all extremities   Lymphadenopathy:     Cervical: No cervical adenopathy.  Skin:    General: Skin is warm and dry.  Neurological:     Mental Status: She is alert. Mental status is at baseline.  Psychiatric:        Mood and Affect: Mood normal.     ASSESSMENT/ PLAN:  TODAY  Hyperlipidemia associated with type 2 diabetes mellitus: ldl 50 will continue lipitor 20 mg daily   2. Anemia due to stage 3b chronic kidney disease: hgb 8.6; will continue iron daily  3. Increased intraocular pressure bilateral: will continue cosopt to both eyes and xalatan both eyes     PREVIOUS   4. Dementia without behavioral disturbance: weight is 186 pounds; stable; will monitor   5. Protein calorie malnutrition: protein 6.2 albumin 3.0 will continue supplements as directed   6. Chronic depression: is off remeron due to her history of falls.   7. Benign hypertension with CKD (chronic kidney disease) stage III: b/p 137/54 will continue lisinopril 5 mg daily will monitor   8. Post menopausal osteoporosis: t score -2.561 is on fosamax 70 mg weekly   9. Controlled type 2 diabetes mellitus with stage 3 chronic kidney disease with long term current use of insulin:hgb A1c 10.0 will continue basaglar 25 units nightly and novolog 5 units with meals.   10. Stage 3 chronic kidney disease due to type 2 diabetes mellitus: bun 44; creat 1.74; gfr 30    Will check hgb A1c an urine ACR    Ok Edwards NP [facility]  call [phone]

## 2022-04-14 ENCOUNTER — Other Ambulatory Visit (HOSPITAL_COMMUNITY)
Admission: RE | Admit: 2022-04-14 | Discharge: 2022-04-14 | Disposition: A | Discharge status: Home / Self Care | Payer: Medicare Other | Source: Skilled Nursing Facility | Attending: Adult Health | Admitting: Adult Health

## 2022-04-14 DIAGNOSIS — E119 Type 2 diabetes mellitus without complications: Secondary | ICD-10-CM | POA: Insufficient documentation

## 2022-04-14 LAB — HEMOGLOBIN A1C
Hgb A1c MFr Bld: 7 % — ABNORMAL HIGH (ref 4.8–5.6)
Mean Plasma Glucose: 154.2 mg/dL

## 2022-04-16 LAB — MICROALBUMIN / CREATININE URINE RATIO
Creatinine, Urine: 105 mg/dL
Microalb Creat Ratio: 134 mg/g creat — ABNORMAL HIGH (ref 0–29)
Microalb, Ur: 140.6 ug/mL — ABNORMAL HIGH

## 2022-04-21 ENCOUNTER — Encounter: Payer: Self-pay | Admitting: Adult Health

## 2022-04-21 ENCOUNTER — Encounter: Payer: Self-pay | Admitting: Family Medicine

## 2022-04-21 ENCOUNTER — Non-Acute Institutional Stay (INDEPENDENT_AMBULATORY_CARE_PROVIDER_SITE_OTHER): Payer: Medicare Other | Admitting: Family Medicine

## 2022-04-21 ENCOUNTER — Non-Acute Institutional Stay (SKILLED_NURSING_FACILITY): Payer: Medicare Other | Admitting: Adult Health

## 2022-04-21 DIAGNOSIS — L309 Dermatitis, unspecified: Secondary | ICD-10-CM

## 2022-04-21 DIAGNOSIS — L299 Pruritus, unspecified: Secondary | ICD-10-CM

## 2022-04-21 DIAGNOSIS — B86 Scabies: Secondary | ICD-10-CM

## 2022-04-21 NOTE — Progress Notes (Unsigned)
Location:  [facility] Room Number: 101 Place of Service:  SNF (31)   CODE STATUS: dnr   No Known Allergies  Chief Complaint  Patient presents with   Acute Visit    rash    HPI:  She has had a prolonged rash on her trunk and neck. She has been seen by dermatology. She continues to scratch her skin open. There are no reports of fevers present.   Past Medical History:  Diagnosis Date   CAD (coronary artery disease)    DM (diabetes mellitus) ([place])    Glaucoma    HTN (hypertension)    Obesity    Osteoarthritis     Past Surgical History:  Procedure Laterality Date   CHOLECYSTECTOMY     CORONARY STENT PLACEMENT     x3   REFRACTIVE SURGERY     detached retina   TUBAL LIGATION     77 yrs old    Social History   Socioeconomic History   Marital status: Divorced    Spouse name: Not on file   Number of children: 2   Years of education: Not on file   Highest education level: Not on file  Occupational History   Occupation: retired  Tobacco Use   Smoking status: Every Day    Packs/day: 1.00    Types: Cigarettes   Smokeless tobacco: Not on file  Vaping Use   Vaping Use: Not on file  Substance and Sexual Activity   Alcohol use: Yes    Comment: rarely   Drug use: Never   Sexual activity: Not on file  Other Topics Concern   Not on file  Social History Narrative   Not on file   Social Determinants of Health   Financial Resource Strain: Not on file  Food Insecurity: Not on file  Transportation Needs: Not on file  Physical Activity: Not on file  Stress: Not on file  Social Connections: Not on file  Intimate Partner Violence: Not on file   Family History  Problem Relation Age of Onset   Stroke Father    Diabetes Sister    Hypertension Sister    Arthritis Other    Heart disease Other    Hypertension Other    Stroke Other    Kidney disease Other    Diabetes Other       VITAL SIGNS BP 128/63   Pulse 68   Temp 98.2 F (36.8  C)   Ht 5' 4"$  (1.626 m)   Wt 186 lb 9.6 oz (84.6 kg)   BMI 32.03 kg/m   Outpatient Encounter Medications as of 04/21/2022  Medication Sig   acetaminophen (TYLENOL) 650 MG CR tablet Take 650 mg by mouth every 6 (six) hours as needed for pain.   alendronate (FOSAMAX) 70 MG tablet Take 70 mg by mouth once a week. Take with a full glass of water on an empty stomach.   aspirin 81 MG tablet Take 81 mg by mouth daily.   atorvastatin (LIPITOR) 20 MG tablet Take 20 mg by mouth daily.   camphor-menthol (SARNA) lotion Apply 1 Application topically 2 (two) times daily.   cholecalciferol (VITAMIN D3) 25 MCG (1000 UNIT) tablet Take 1,000 Units by mouth daily.   clobetasol cream (TEMOVATE) 0.05 % Apply to labia/vaginal area twice a week. Once A Day on Mon, Fri   dorzolamide-timolol (COSOPT) 22.3-6.8 MG/ML ophthalmic solution Place 1 drop into both eyes daily.  Wait at least 5 minutes between multiple drops  in same eye.   ferrous sulfate 325 (65 FE) MG tablet Take 325 mg by mouth daily.   hydrocortisone 1 % ointment Apply 1 Application topically 3 (three) times daily.   Insulin Glargine (BASAGLAR KWIKPEN) 100 UNIT/ML Inject 25 Units into the skin at bedtime.   insulin lispro (HUMALOG KWIKPEN) 100 UNIT/ML KwikPen Inject 5 units subcutaneous with meals.   latanoprost (XALATAN) 0.005 % ophthalmic solution Place 1 drop into both eyes daily.   loratadine (CLARITIN) 10 MG tablet Take 10 mg by mouth daily.   NON FORMULARY Diet:Regular   PRESCRIPTION MEDICATION Endit Skin Protectant Ointment; topical Special Instructions: Apply to vaginal/perineal area bid. Twice A Day   No facility-administered encounter medications on file as of 04/21/2022.     SIGNIFICANT DIAGNOSTIC EXAMS  PREVIOUS   07-25-21: dexa: t score: -2.521  NO NEW EXAMS   PREVIOUS   05-21-21: hgb a1c 6.1; tsh 1.689; uric acid: 7.2  06-04-21: glucose 95; bun 35; creat 1.40; k+ 4.1; na++ 135; ca 8.4 protein 5.6; albumin 3.3 chol 107; ldl 50;  trig 32; hdl 51 06-06-21: iron 31; tibc 249; vitamin D 51.87 06-07-21: wbc 5.0; hgb 8.6; hct 26.9; mcv 191.5 plt 175  07-08-21: hepatitis C nr; urine micro-albumin  54.5 09-26-21: hgb a1c 9.0   10-07-21: glucose 282; bun 47; creat 1.90; k+ 4.3; na++ 137; ca 8.8; gfr 27 11-06-21: glucose 104; bun 36; creat 1.77; k+ 4.4; na++ 137; ca 8.4; gfr 29 11-18-21: tsh 2.112  12-23-21: hgb a1c 10.0 01-09-22: wbc 10.0; hgb 10.2; hct 30.1; mcv 93.5 plt 209; glucose 256; bun 44; creat 1.74; k+ 4.4; na++ 135; ca 8.5; gfr 30; protein 6.2; albumin 3.0   NO NEW LABS.    Review of Systems  Constitutional:  Negative for malaise/fatigue.  Respiratory:  Negative for cough and shortness of breath.   Cardiovascular:  Negative for chest pain, palpitations and leg swelling.  Gastrointestinal:  Negative for abdominal pain, constipation and heartburn.  Musculoskeletal:  Negative for back pain, joint pain and myalgias.  Skin:  Positive for rash.  Neurological:  Negative for dizziness.  Psychiatric/Behavioral:  The patient is not nervous/anxious.     Physical Exam Constitutional:      General: She is not in acute distress.    Appearance: She is well-developed. She is obese. She is not diaphoretic.  Neck:     Thyroid: No thyromegaly.  Cardiovascular:     Rate and Rhythm: Normal rate and regular rhythm.     Pulses: Normal pulses.     Heart sounds: Normal heart sounds.  Pulmonary:     Effort: Pulmonary effort is normal. No respiratory distress.     Breath sounds: Normal breath sounds.  Abdominal:     General: Bowel sounds are normal. There is no distension.     Palpations: Abdomen is soft.     Tenderness: There is no abdominal tenderness.  Musculoskeletal:     Cervical back: Neck supple.     Right lower leg: No edema.     Left lower leg: No edema.     Comments: Able to move all extremities   Lymphadenopathy:     Cervical: No cervical adenopathy.  Skin:    General: Skin is warm and dry.     Comments:  Excoriation: raised rash on front torso and posterior neck    Neurological:     Mental Status: She is alert. Mental status is at baseline.  Psychiatric:        Mood and  Affect: Mood normal.       ASSESSMENT/ PLAN:  TODAY  Dermatitis: will begin prednisone 40 mg daily for 5 days and will monitor    Ok Edwards NP [facility]  call [phone]

## 2022-04-21 NOTE — Progress Notes (Signed)
Location:  [place] of Service:   [place]: DNR  No Known Allergies  Chief Complaint  Patient presents with   Itching    HPI:  Asked to see Paige Wilkins for persistent itching for few months Various treatments have been tried without success. Patient recently seen by dermatology who diagnosed Paige Wilkins with a scabies infect.  Paige Wilkins has been treated, head to toe, twice with anti-parasiticidal, once in December and once after dermatology consultation  Paige Wilkins was not feeling an itch when we visited her.  During the interview she began to remember the body sites that itched.   PE VS Reviewed Skin Wide distribution of excoriations with some site with linear slightly raised skin on frontal torso and excoriation lines on posterior neck.   The surrounding skin with slight blanching erythema  Past Medical History:  Diagnosis Date   CAD (coronary artery disease)    DM (diabetes mellitus) ([place])    Glaucoma    HTN (hypertension)    Obesity    Osteoarthritis     Past Surgical History:  Procedure Laterality Date   CHOLECYSTECTOMY     CORONARY STENT PLACEMENT     x3   REFRACTIVE SURGERY     detached retina   TUBAL LIGATION     77 yrs old    Social History   Socioeconomic History   Marital status: Divorced    Spouse name: Not on file   Number of children: 2   Years of education: Not on file   Highest education level: Not on file  Occupational History   Occupation: retired  Tobacco Use   Smoking status: Every Day    Packs/day: 1.00    Types: Cigarettes   Smokeless tobacco: Not on file  Vaping Use   Vaping Use: Not on file  Substance and Sexual Activity   Alcohol use: Yes    Comment: rarely   Drug use: Never   Sexual activity: Not on file  Other Topics Concern   Not on file  Social History Narrative   Not on file   Social Determinants of Health   Financial Resource Strain: Not on file  Food  Insecurity: Not on file  Transportation Needs: Not on file  Physical Activity: Not on file  Stress: Not on file  Social Connections: Not on file  Intimate Partner Violence: Not on file   Family History  Problem Relation Age of Onset   Stroke Father    Diabetes Sister    Hypertension Sister    Arthritis Other    Heart disease Other    Hypertension Other    Stroke Other    Kidney disease Other    Diabetes Other       VITAL SIGNS There were no vitals taken for this visit.  Outpatient Encounter Medications as of 04/21/2022  Medication Sig   acetaminophen (TYLENOL) 650 MG CR tablet Take 650 mg by mouth every 6 (six) hours as needed for pain.   aspirin 81 MG tablet Take 81 mg by mouth daily.   atorvastatin (LIPITOR) 20 MG tablet Take 20 mg by mouth daily.   cholecalciferol (VITAMIN D3) 25 MCG (1000 UNIT) tablet Take 1,000 Units by mouth daily.   clobetasol cream (TEMOVATE) 0.05 % Apply to labia/vaginal area twice a week. Once A Day on Mon, Fri   dorzolamide-timolol (COSOPT) 22.3-6.8 MG/ML ophthalmic solution Place 1 drop into both eyes daily.  Wait at  least 5 minutes between multiple drops in same eye.   ferrous sulfate 325 (65 FE) MG tablet Take 325 mg by mouth daily.   hydrocortisone 1 % ointment Apply 1 Application topically 3 (three) times daily.   Insulin Glargine (BASAGLAR KWIKPEN) 100 UNIT/ML Inject 25 Units into the skin at bedtime.   insulin lispro (HUMALOG KWIKPEN) 100 UNIT/ML KwikPen Inject 5 units subcutaneous with meals.   latanoprost (XALATAN) 0.005 % ophthalmic solution Place 1 drop into both eyes daily.   alendronate (FOSAMAX) 70 MG tablet Take 70 mg by mouth once a week. Take with a full glass of water on an empty stomach.   camphor-menthol (SARNA) lotion Apply 1 Application topically 2 (two) times daily.   loratadine (CLARITIN) 10 MG tablet Take 10 mg by mouth daily.   NON FORMULARY Diet:Regular   PRESCRIPTION MEDICATION Endit Skin Protectant Ointment;  topical Special Instructions: Apply to vaginal/perineal area bid. Twice A Day   No facility-administered encounter medications on file as of 04/21/2022.    ASSESSMENT/ PLAN:  Given Paige Wilkins's recent diagnosis and treatment for Scabies infestation, it would not be surprising for her to have up to four week of pruritus in reaction to the remnant scabetic matter within the their epidermal tunnels.   Treatment for this post-treatment itching is a very high potency cream, like clobetasol or oral prednisone 30-40 mg daily for 7 days. Occasionally, after stopping the Prednisone, the itching may return, you can then use the topical very high potency steroid or another course of prednisone.  Temporarily raising the dose of loratadine to 20 mg daily for 7 to 14 days may also help with her itching.   Lissa Morales, MD

## 2022-04-25 ENCOUNTER — Non-Acute Institutional Stay (SKILLED_NURSING_FACILITY): Payer: Medicare Other | Admitting: Adult Health

## 2022-04-25 DIAGNOSIS — E1122 Type 2 diabetes mellitus with diabetic chronic kidney disease: Secondary | ICD-10-CM | POA: Diagnosis not present

## 2022-04-25 DIAGNOSIS — D631 Anemia in chronic kidney disease: Secondary | ICD-10-CM

## 2022-04-25 DIAGNOSIS — F339 Major depressive disorder, recurrent, unspecified: Secondary | ICD-10-CM

## 2022-04-25 DIAGNOSIS — N183 Chronic kidney disease, stage 3 unspecified: Secondary | ICD-10-CM

## 2022-04-25 DIAGNOSIS — N1832 Chronic kidney disease, stage 3b: Secondary | ICD-10-CM | POA: Diagnosis not present

## 2022-04-25 IMAGING — MG MM DIGITAL SCREENING BILAT W/ TOMO AND CAD
8 of 14 series · 9 of 40 positions shown · non-contrast
Comparison: Previous exam(s).

CLINICAL DATA: Screening.

EXAM:
DIGITAL SCREENING BILATERAL MAMMOGRAM WITH TOMOSYNTHESIS AND CAD
TECHNIQUE: Bilateral screening digital craniocaudal and mediolateral oblique
mammograms were obtained. Bilateral screening digital breast
tomosynthesis was performed. The images were evaluated with
computer-aided detection.

[L MLO synth-2D (1 of 2)]
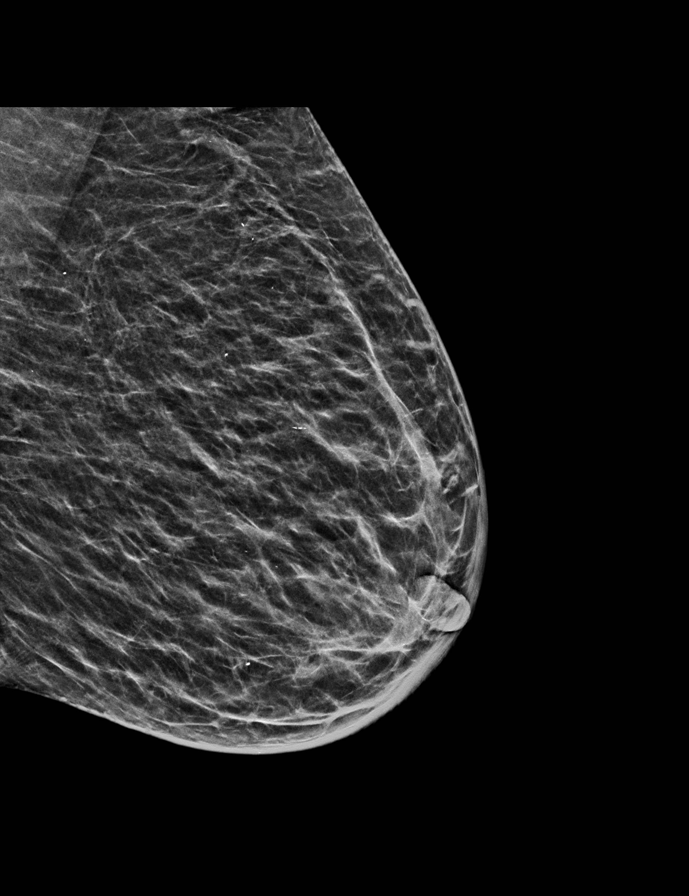

[L MLO synth-2D (2 of 2)]
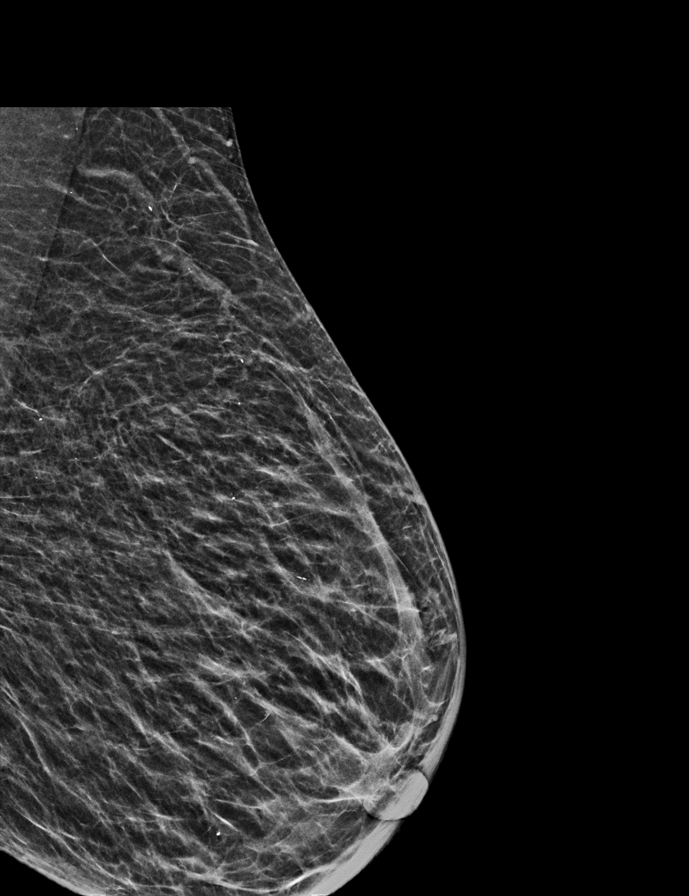

[R CC synth-2D (1 of 2)]
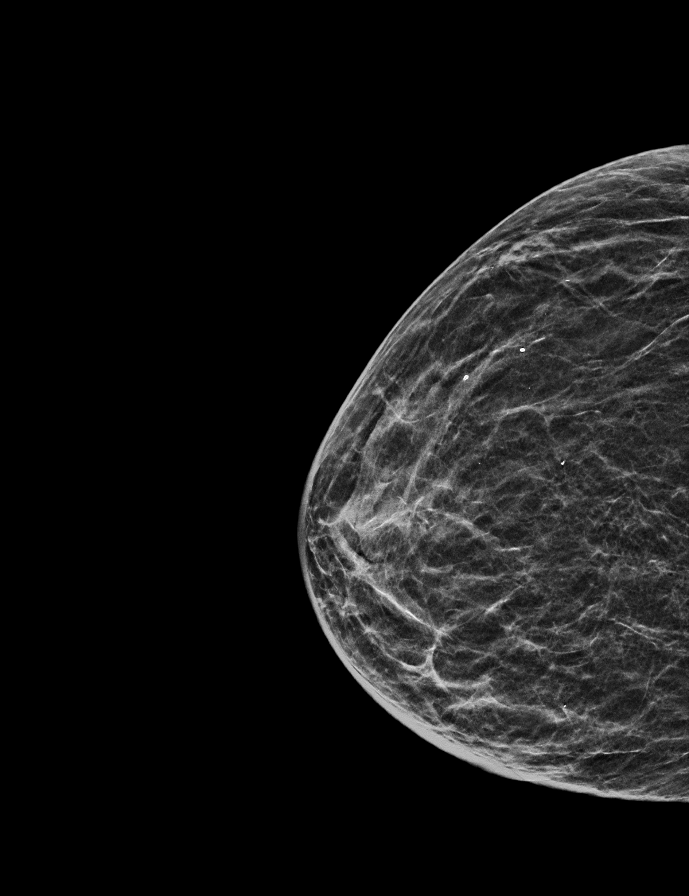

[R MLO synth-2D]
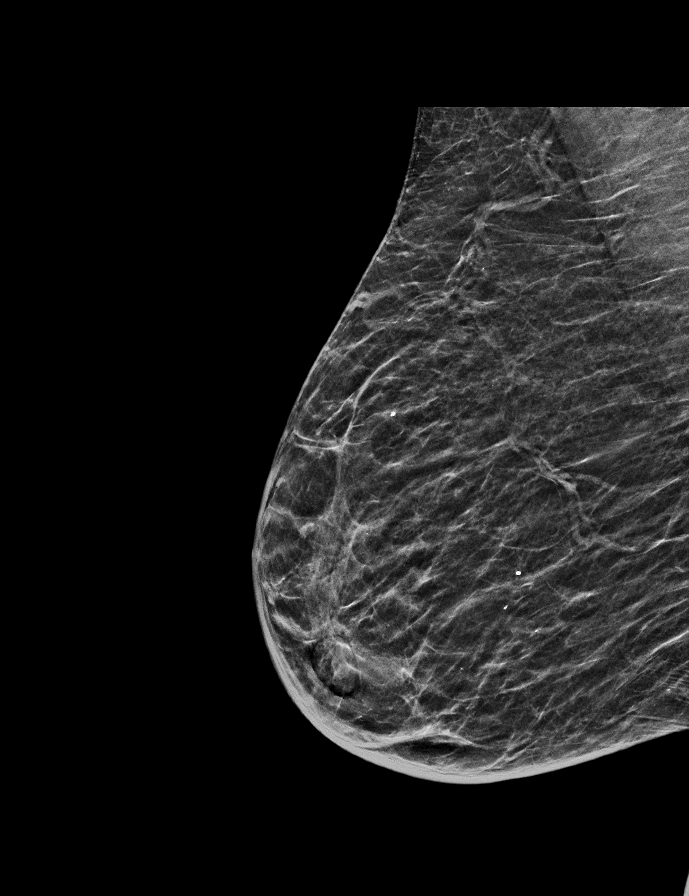

[R CC synth-2D (2 of 2)]
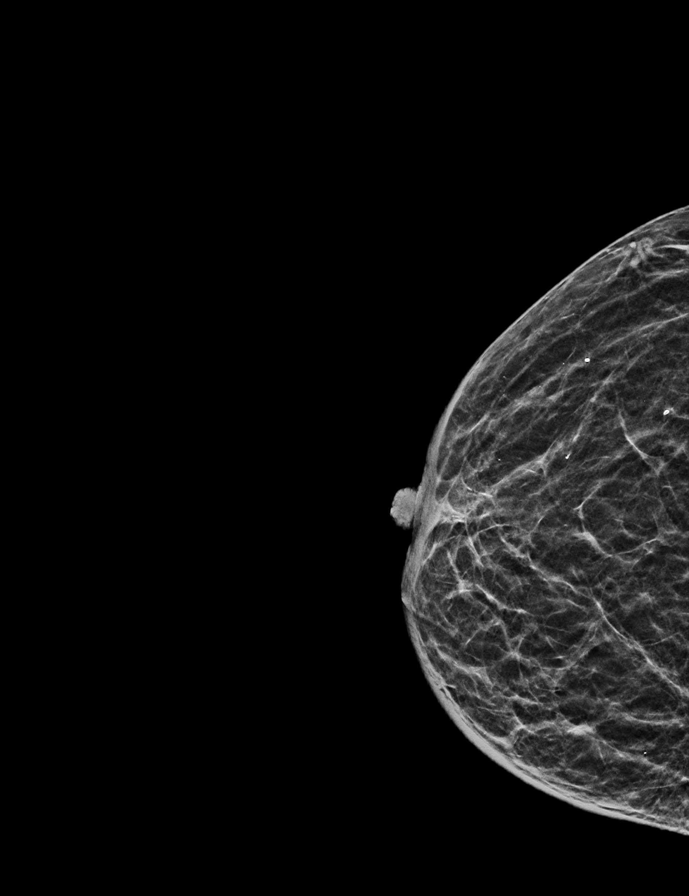

[L CC synth-2D (1 of 2)]
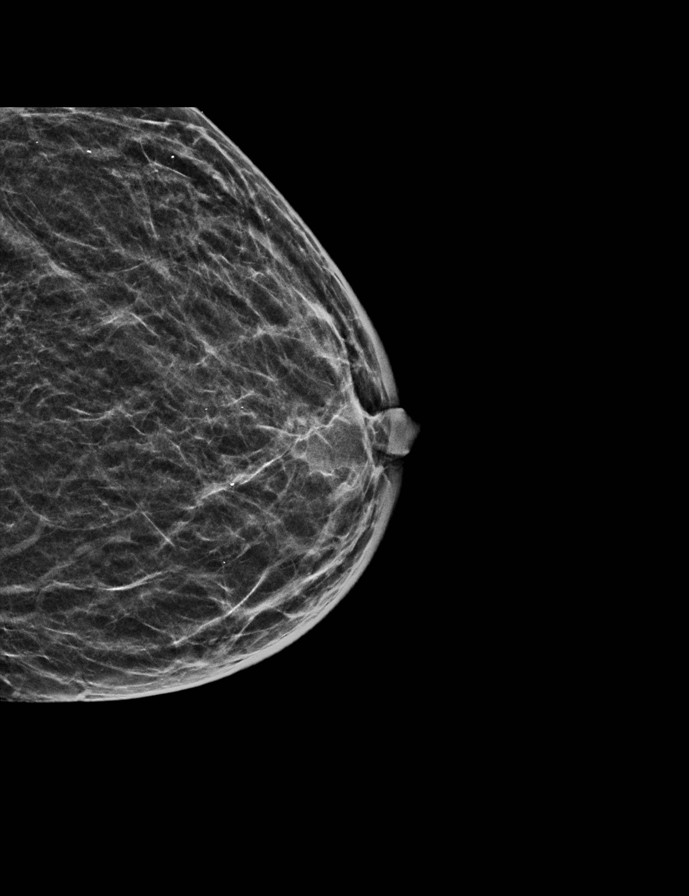

[L CC synth-2D (2 of 2)]
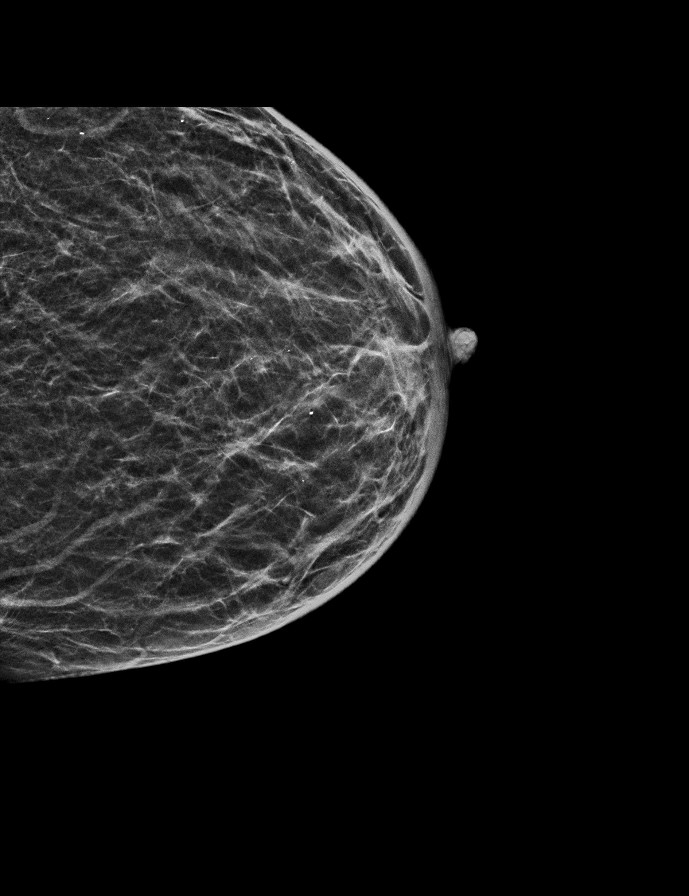

[R MLO tomo · 2 of 34 frames shown]
[frame 12/34]
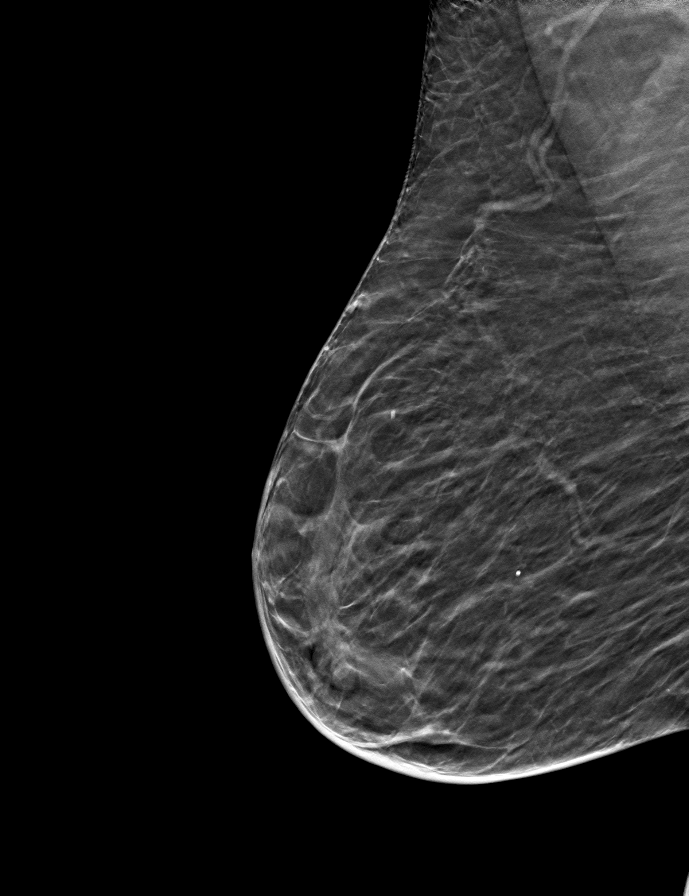
[frame 17/34]
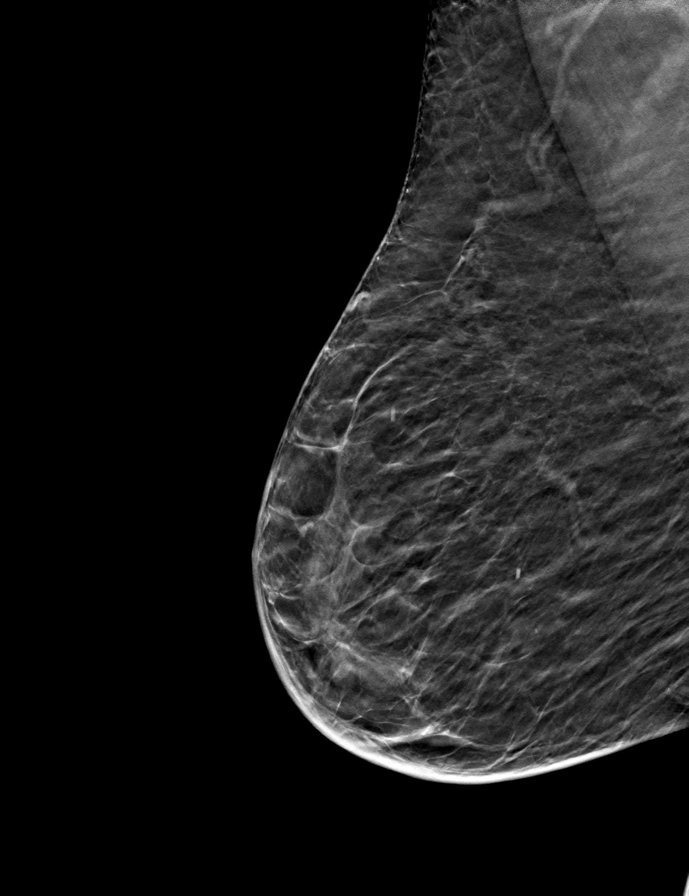

[9 of 40 positions shown; findings below may reference images not displayed]

ACR Breast Density Category b: There are scattered areas of
fibroglandular density.
FINDINGS: There are no findings suspicious for malignancy.
IMPRESSION: No mammographic evidence of malignancy. A result letter of this
screening mammogram will be mailed directly to the patient.

RECOMMENDATION:
Screening mammogram in one year. (Code:51-O-LD2)

BI-RADS CATEGORY  1: Negative.

## 2022-04-28 ENCOUNTER — Other Ambulatory Visit (HOSPITAL_COMMUNITY)
Admission: RE | Admit: 2022-04-28 | Discharge: 2022-04-28 | Disposition: A | Discharge status: Home / Self Care | Payer: Medicare Other | Source: Skilled Nursing Facility | Attending: Adult Health | Admitting: Adult Health

## 2022-04-28 ENCOUNTER — Encounter: Payer: Self-pay | Admitting: Adult Health

## 2022-04-28 ENCOUNTER — Non-Acute Institutional Stay (SKILLED_NURSING_FACILITY): Payer: Medicare Other | Admitting: Adult Health

## 2022-04-28 DIAGNOSIS — E1122 Type 2 diabetes mellitus with diabetic chronic kidney disease: Secondary | ICD-10-CM

## 2022-04-28 DIAGNOSIS — N1832 Chronic kidney disease, stage 3b: Secondary | ICD-10-CM

## 2022-04-28 DIAGNOSIS — F0390 Unspecified dementia without behavioral disturbance: Secondary | ICD-10-CM | POA: Insufficient documentation

## 2022-04-28 DIAGNOSIS — R7989 Other specified abnormal findings of blood chemistry: Secondary | ICD-10-CM

## 2022-04-28 DIAGNOSIS — Z794 Long term (current) use of insulin: Secondary | ICD-10-CM | POA: Diagnosis not present

## 2022-04-28 LAB — TSH: TSH: 0.393 u[IU]/mL (ref 0.350–4.500)

## 2022-04-28 NOTE — Progress Notes (Signed)
Location:  [place] Room Number: 101 Place of Service:  SNF (31)   CODE STATUS: dnr  No Known Allergies  Chief Complaint  Patient presents with   Acute Visit    Diabetes     HPI:  She is having periodic elevated cbg readings. The range is 94-335. She is presently taking farxiga 5 mg daily basaglar 25 units nightly and humalog 5 units with meals. She has a good appetite . There are no reports of symptoms of hypo/hyperglycemia.   Past Medical History:  Diagnosis Date   CAD (coronary artery disease)    DM (diabetes mellitus) ([place])    Glaucoma    HTN (hypertension)    Obesity    Osteoarthritis     Past Surgical History:  Procedure Laterality Date   CHOLECYSTECTOMY     CORONARY STENT PLACEMENT     x3   REFRACTIVE SURGERY     detached retina   TUBAL LIGATION     77 yrs old    Social History   Socioeconomic History   Marital status: Divorced    Spouse name: Not on file   Number of children: 2   Years of education: Not on file   Highest education level: Not on file  Occupational History   Occupation: retired  Tobacco Use   Smoking status: Every Day    Packs/day: 1.00    Types: Cigarettes   Smokeless tobacco: Not on file  Vaping Use   Vaping Use: Not on file  Substance and Sexual Activity   Alcohol use: Yes    Comment: rarely   Drug use: Never   Sexual activity: Not on file  Other Topics Concern   Not on file  Social History Narrative   Not on file   Social Determinants of Health   Financial Resource Strain: Not on file  Food Insecurity: Not on file  Transportation Needs: Not on file  Physical Activity: Not on file  Stress: Not on file  Social Connections: Not on file  Intimate Partner Violence: Not on file   Family History  Problem Relation Age of Onset   Stroke Father    Diabetes Sister    Hypertension Sister    Arthritis Other    Heart disease Other    Hypertension Other    Stroke Other    Kidney disease Other     Diabetes Other       VITAL SIGNS BP 130/68   Pulse 78   Temp 98.6 F (37 C)   Resp (!) 22   Ht '5\' 4"'$  (1.626 m)   Wt 186 lb 9.6 oz (84.6 kg)   SpO2 97%   BMI 32.03 kg/m   Outpatient Encounter Medications as of 04/28/2022  Medication Sig   dapagliflozin propanediol (FARXIGA) 5 MG TABS tablet Take 5 mg by mouth daily.   acetaminophen (TYLENOL) 650 MG CR tablet Take 650 mg by mouth every 6 (six) hours as needed for pain.   alendronate (FOSAMAX) 70 MG tablet Take 70 mg by mouth once a week. Take with a full glass of water on an empty stomach.   aspirin 81 MG tablet Take 81 mg by mouth daily.   atorvastatin (LIPITOR) 20 MG tablet Take 20 mg by mouth daily.   camphor-menthol (SARNA) lotion Apply 1 Application topically 2 (two) times daily.   cholecalciferol (VITAMIN D3) 25 MCG (1000 UNIT) tablet Take 1,000 Units by mouth daily.   clobetasol cream (TEMOVATE) 0.05 % Apply  to labia/vaginal area twice a week. Once A Day on Mon, Fri   dorzolamide-timolol (COSOPT) 22.3-6.8 MG/ML ophthalmic solution Place 1 drop into both eyes daily.  Wait at least 5 minutes between multiple drops in same eye.   ferrous sulfate 325 (65 FE) MG tablet Take 325 mg by mouth daily.   hydrocortisone 1 % ointment Apply 1 Application topically 3 (three) times daily.   Insulin Glargine (BASAGLAR KWIKPEN) 100 UNIT/ML Inject 25 Units into the skin at bedtime.   insulin lispro (HUMALOG KWIKPEN) 100 UNIT/ML KwikPen Inject 5 units subcutaneous with meals.   latanoprost (XALATAN) 0.005 % ophthalmic solution Place 1 drop into both eyes daily.   loratadine (CLARITIN) 10 MG tablet Take 10 mg by mouth daily.   NON FORMULARY Diet:Regular   PRESCRIPTION MEDICATION Endit Skin Protectant Ointment; topical Special Instructions: Apply to vaginal/perineal area bid. Twice A Day   No facility-administered encounter medications on file as of 04/28/2022.     SIGNIFICANT DIAGNOSTIC EXAMS  PREVIOUS   07-25-21: dexa: t score:  -2.521  NO NEW EXAMS   PREVIOUS   05-21-21: hgb a1c 6.1; tsh 1.689; uric acid: 7.2  06-04-21: glucose 95; bun 35; creat 1.40; k+ 4.1; na++ 135; ca 8.4 protein 5.6; albumin 3.3 chol 107; ldl 50; trig 32; hdl 51 06-06-21: iron 31; tibc 249; vitamin D 51.87 06-07-21: wbc 5.0; hgb 8.6; hct 26.9; mcv 191.5 plt 175  07-08-21: hepatitis C nr; urine micro-albumin  54.5 09-26-21: hgb a1c 9.0   10-07-21: glucose 282; bun 47; creat 1.90; k+ 4.3; na++ 137; ca 8.8; gfr 27 11-06-21: glucose 104; bun 36; creat 1.77; k+ 4.4; na++ 137; ca 8.4; gfr 29 11-18-21: tsh 2.112  12-23-21: hgb a1c 10.0 01-09-22: wbc 10.0; hgb 10.2; hct 30.1; mcv 93.5 plt 209; glucose 256; bun 44; creat 1.74; k+ 4.4; na++ 135; ca 8.5; gfr 30; protein 6.2; albumin 3.0   TODAY  04-14-22: hgb A1c 7.0; ACR 134; micro-albumin 140.6 04-28-22: tsh 0.393     Review of Systems  Constitutional:  Negative for malaise/fatigue.  Respiratory:  Negative for cough and shortness of breath.   Cardiovascular:  Negative for chest pain, palpitations and leg swelling.  Gastrointestinal:  Negative for abdominal pain, constipation and heartburn.  Musculoskeletal:  Negative for back pain, joint pain and myalgias.  Skin: Negative.   Neurological:  Negative for dizziness.  Psychiatric/Behavioral:  The patient is not nervous/anxious.    Physical Exam Constitutional:      General: She is not in acute distress.    Appearance: She is well-developed. She is obese. She is not diaphoretic.  Neck:     Thyroid: No thyromegaly.  Cardiovascular:     Rate and Rhythm: Normal rate and regular rhythm.     Pulses: Normal pulses.     Heart sounds: Normal heart sounds.  Pulmonary:     Effort: Pulmonary effort is normal. No respiratory distress.     Breath sounds: Normal breath sounds.  Abdominal:     General: Bowel sounds are normal. There is no distension.     Palpations: Abdomen is soft.     Tenderness: There is no abdominal tenderness.  Musculoskeletal:      Cervical back: Neck supple.     Right lower leg: No edema.     Left lower leg: No edema.     Comments: Able to move all extremities   Lymphadenopathy:     Cervical: No cervical adenopathy.  Skin:    General: Skin is warm and  dry.  Neurological:     Mental Status: She is alert. Mental status is at baseline.  Psychiatric:        Mood and Affect: Mood normal.       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   2. Abnormal tsh: level 0.393 previous 2.112; will continue to monitor    Ok Edwards NP [facility]

## 2022-04-28 NOTE — Progress Notes (Signed)
Location:  [place] Room Number: NO/101/D Place of Service:  SNF (31)   CODE STATUS: dnr   No Known Allergies  Chief Complaint  Patient presents with   Acute Visit    Patient is being seen for depression    HPI:  She is spending more time in bed; which is unusual for her. Normally she is up and about in her wheelchair.  There is concern that she is having depression. There are no reports of weight loss or changes in appetite her current weight is 186 pounds. She denies any anxiety or depressive thoughts. Her SLUMS score 9/24.   Past Medical History:  Diagnosis Date   CAD (coronary artery disease)    DM (diabetes mellitus)  with CKD    GFR 30   Glaucoma    HTN (hypertension)    Obesity    Osteoarthritis     Past Surgical History:  Procedure Laterality Date   CHOLECYSTECTOMY     CORONARY STENT PLACEMENT     x3   REFRACTIVE SURGERY     detached retina   TUBAL LIGATION     77 yrs old    Social History   Socioeconomic History   Marital status: Divorced    Spouse name: Not on file   Number of children: 2   Years of education: Not on file   Highest education level: Not on file  Occupational History   Occupation: retired  Tobacco Use   Smoking status: Every Day    Packs/day: 1.00    Types: Cigarettes   Smokeless tobacco: Not on file  Vaping Use   Vaping Use: Not on file  Substance and Sexual Activity   Alcohol use: Yes    Comment: rarely   Drug use: Never   Sexual activity: Not on file  Other Topics Concern   Not on file  Social History Narrative   Not on file   Social Determinants of Health   Financial Resource Strain: Not on file  Food Insecurity: Not on file  Transportation Needs: Not on file  Physical Activity: Not on file  Stress: Not on file  Social Connections: Not on file  Intimate Partner Violence: Not on file   Family History  Problem Relation Age of Onset   Stroke Father    Diabetes Sister    Hypertension  Sister    Arthritis Other    Heart disease Other    Hypertension Other    Stroke Other    Kidney disease Other    Diabetes Other       VITAL SIGNS BP (!) 142/86   Pulse 78   Temp 98.6 F (37 C)   Resp (!) 22   Ht '5\' 4"'$  (1.626 m)   Wt 186 lb 9.6 oz (84.6 kg)   SpO2 98%   BMI 32.03 kg/m   Outpatient Encounter Medications as of 04/25/2022  Medication Sig Note   acetaminophen (TYLENOL) 650 MG CR tablet Take 650 mg by mouth every 6 (six) hours as needed for pain.    alendronate (FOSAMAX) 70 MG tablet Take 70 mg by mouth once a week. Take with a full glass of water on an empty stomach. 04/30/2022: No DEXA study on record.  Clinically risk outweigh the benefits in this 77 year old demented female who is nonambulatory and bedridden for most of the time. Alendronate will be discontinued.   aspirin 81 MG tablet Take 81 mg by mouth daily.    atorvastatin (LIPITOR)  20 MG tablet Take 20 mg by mouth daily.    camphor-menthol (SARNA) lotion Apply 1 Application topically 2 (two) times daily.    cholecalciferol (VITAMIN D3) 25 MCG (1000 UNIT) tablet Take 1,000 Units by mouth daily.    clobetasol cream (TEMOVATE) 0.05 % Apply to labia/vaginal area twice a week. Once A Day on Mon, Fri    dorzolamide-timolol (COSOPT) 22.3-6.8 MG/ML ophthalmic solution Place 1 drop into both eyes daily.  Wait at least 5 minutes between multiple drops in same eye.    ferrous sulfate 325 (65 FE) MG tablet Take 325 mg by mouth daily.    hydrocortisone 1 % ointment Apply 1 Application topically 3 (three) times daily.    Insulin Glargine (BASAGLAR KWIKPEN) 100 UNIT/ML Inject 25 Units into the skin at bedtime.    insulin lispro (HUMALOG KWIKPEN) 100 UNIT/ML KwikPen Inject 7 Units into the skin 3 (three) times daily. Inject 7 units subcutaneous with meals.    latanoprost (XALATAN) 0.005 % ophthalmic solution Place 1 drop into both eyes daily.    loratadine (CLARITIN) 10 MG tablet Take 10 mg by mouth daily.    NON  FORMULARY Diet:Regular    PRESCRIPTION MEDICATION Endit Skin Protectant Ointment; topical Special Instructions: Apply to vaginal/perineal area bid. Twice A Day    No facility-administered encounter medications on file as of 04/25/2022.     SIGNIFICANT DIAGNOSTIC EXAMS  PREVIOUS   07-25-21: dexa: t score: -2.521  NO NEW EXAMS   PREVIOUS   05-21-21: hgb a1c 6.1; tsh 1.689; uric acid: 7.2  06-04-21: glucose 95; bun 35; creat 1.40; k+ 4.1; na++ 135; ca 8.4 protein 5.6; albumin 3.3 chol 107; ldl 50; trig 32; hdl 51 06-06-21: iron 31; tibc 249; vitamin D 51.87 06-07-21: wbc 5.0; hgb 8.6; hct 26.9; mcv 191.5 plt 175  07-08-21: hepatitis C nr; urine micro-albumin  54.5 09-26-21: hgb a1c 9.0   10-07-21: glucose 282; bun 47; creat 1.90; k+ 4.3; na++ 137; ca 8.8; gfr 27 11-06-21: glucose 104; bun 36; creat 1.77; k+ 4.4; na++ 137; ca 8.4; gfr 29 11-18-21: tsh 2.112  12-23-21: hgb a1c 10.0 01-09-22: wbc 10.0; hgb 10.2; hct 30.1; mcv 93.5 plt 209; glucose 256; bun 44; creat 1.74; k+ 4.4; na++ 135; ca 8.5; gfr 30; protein 6.2; albumin 3.0   NO NEW LABS.    Review of Systems  Constitutional:  Negative for malaise/fatigue.  Respiratory:  Negative for cough and shortness of breath.   Cardiovascular:  Negative for chest pain, palpitations and leg swelling.  Gastrointestinal:  Negative for abdominal pain, constipation and heartburn.  Musculoskeletal:  Negative for back pain, joint pain and myalgias.  Skin: Negative.   Neurological:  Negative for dizziness.  Psychiatric/Behavioral:  The patient is not nervous/anxious.     Physical Exam Constitutional:      General: She is not in acute distress.    Appearance: She is well-developed. She is obese. She is not diaphoretic.  Neck:     Thyroid: No thyromegaly.  Cardiovascular:     Rate and Rhythm: Normal rate and regular rhythm.     Pulses: Normal pulses.     Heart sounds: Normal heart sounds.  Pulmonary:     Effort: Pulmonary effort is normal. No  respiratory distress.     Breath sounds: Normal breath sounds.  Abdominal:     General: Bowel sounds are normal. There is no distension.     Palpations: Abdomen is soft.     Tenderness: There is no abdominal tenderness.  Musculoskeletal:     Cervical back: Neck supple.     Right lower leg: No edema.     Left lower leg: No edema.     Comments: Able to move all extremities    Lymphadenopathy:     Cervical: No cervical adenopathy.  Skin:    General: Skin is warm and dry.  Neurological:     Mental Status: She is alert. Mental status is at baseline.  Psychiatric:        Mood and Affect: Mood normal.       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

## 2022-04-30 ENCOUNTER — Non-Acute Institutional Stay (SKILLED_NURSING_FACILITY): Payer: Medicare Other | Admitting: Internal Medicine

## 2022-04-30 ENCOUNTER — Encounter: Payer: Self-pay | Admitting: Internal Medicine

## 2022-04-30 DIAGNOSIS — E1122 Type 2 diabetes mellitus with diabetic chronic kidney disease: Secondary | ICD-10-CM | POA: Diagnosis not present

## 2022-04-30 DIAGNOSIS — Z794 Long term (current) use of insulin: Secondary | ICD-10-CM

## 2022-04-30 DIAGNOSIS — R7989 Other specified abnormal findings of blood chemistry: Secondary | ICD-10-CM | POA: Diagnosis not present

## 2022-04-30 DIAGNOSIS — F0390 Unspecified dementia without behavioral disturbance: Secondary | ICD-10-CM | POA: Diagnosis not present

## 2022-04-30 DIAGNOSIS — L309 Dermatitis, unspecified: Secondary | ICD-10-CM | POA: Diagnosis not present

## 2022-04-30 DIAGNOSIS — N1832 Chronic kidney disease, stage 3b: Secondary | ICD-10-CM

## 2022-04-30 NOTE — Assessment & Plan Note (Addendum)
Hold Farxiga and treat for possible fungal infection.  Wound Care nurse to manage abrasions.  Because of marked erythema on the left , a course of doxycycline will be Rxed for possible cellulitis. Plastic spoon will be ordered with meals as the linear lacerations are worrisome that she may be using a fork to scratch the thighs in the context of advanced dementia.

## 2022-04-30 NOTE — Assessment & Plan Note (Addendum)
Clinically she is not hyperthyroid but the progressive decrease in TSH may represent subclinical hyperthyroidism.  TSH should be rechecked in 4-6 weeks to rule out progression.

## 2022-04-30 NOTE — Assessment & Plan Note (Signed)
She can provide her birthday but no other meaningful data.  She confabulates about remote events.

## 2022-04-30 NOTE — Progress Notes (Signed)
NURSING HOME LOCATION:  [facility] ROOM NUMBER:  101 D  CODE STATUS:  DNR  PCP:  Ok Edwards NP  This is a nursing facility follow up visit for specific acute issue of bleeding excoriations of BLE.  Interim medical record and care since last SNF visit was updated with review of diagnostic studies and change in clinical status since last visit were documented.  HPI: I was asked to see her emergently because of blood on her diaper and the bed linens.  When the inguinal area was exposed by the Nurse Tech; linear abrasions and lacerations over the upper medial thigh, greater on the left with active bleeding.  There was extensive erythema around this area as well is bruising in the right medial thigh. These findings are in the context of her having been treated last month for possible scabies.  She was seen by Dermatologist Dr. Nevada Crane and allegedly a scabies mite was seen.  She was in isolation and received full antiscabies therapy. She is on loratadine 10 mg daily and has clobetasol 0.05% topically to the inguinal area twice a week.  Additionally a Nurse Tech on the staff here involved in her care was seen in the ED 2/19 and also prescribed scabies medication although the differential diagnosis included contact dermatitis. She is on [place] for her diabetes and to prevent congestive heart failure.  The most recent complete labs were performed 01/09/2022 and revealed a creatinine of 1.74 and GFR 30 indicating CKD borderline low stage IIIb/stage IV.  Serially this represents stability to slight improvement in the CKD.  H/H was 10.2/30.1 with normochromic, normocytic indices.  Again this represents stability to slight improvement from prior values of 9.8/29.0. There is no differential in Epic.  On 04/28/2022 TSH was low normal at 0.393; prior value on 11/18/2021 was therapeutic at 2.112.  She is not on thyroid supplement.  Medical diagnoses include CAD, diabetes with advanced CKD,  glaucoma, essential hypertension, osteoarthritis Her most recent A1c was 7% which represents a dramatic improvement from a value of 10% on 12/23/2021. She has had coronary artery stenting.  Review of systems: Dementia invalidated responses.  She was able to give me her birthdate but could provide no meaningful history.  She confabulated about having fainted several times in the past because "blood count low."  She went on to talk about her mother being a nurse (who) " would understand what was going on but I do not."  Physical exam:  Pertinent or positive findings: She is confused; facies exhibit anxiety.  Hair is disheveled.  Central weight excess is present.  She is edentulous.  Heart sounds are distant and slow.  Breath sounds are decreased.  Abdomen is protuberant.  She has 1+ pitting edema.  Pedal pulses are decreased.  There is marked erythema of the left upper medial thigh with bleeding lacerations and abrasions.  There are smaller nonbleeding lacerations in the right medial thigh area with associated erythema.  She has minor bruising over the forearms with eschar and small excoriations over the forearms.  General appearance: Adequately nourished; no acute distress, increased work of breathing is present.   Lymphatic: No lymphadenopathy about the head, neck, axilla. Eyes: No conjunctival inflammation or lid edema is present. There is no scleral icterus. Ears:  External ear exam shows no significant lesions or deformities.   Nose:  External nasal examination shows no deformity or inflammation. Nasal mucosa are pink and moist without lesions, exudates Oral exam:  Lips and  gums are healthy appearing. There is no oropharyngeal erythema or exudate. Neck:  No thyromegaly, masses, tenderness noted.    Heart:  No gallop, murmur, click, rub .  Lungs: without wheezes, rhonchi, rales, rubs. Abdomen: Bowel sounds are normal. Abdomen is soft and nontender with no organomegaly, hernias, masses. GU:  Deferred  Extremities:  No cyanosis, clubbing  Neurologic exam :Balance, Rhomberg, finger to nose testing could not be completed due to clinical state  See summary under each active problem in the Problem List with associated updated therapeutic plan

## 2022-04-30 NOTE — Assessment & Plan Note (Addendum)
A1c has dropped from 10% down to 7%.  This will be monitored off the [place]. CKD stage III/IV is present but stable to slightly improved.  Med list reviewed.  No nephrotoxic agents identified.

## 2022-04-30 NOTE — Patient Instructions (Signed)
See assessment and plan under each diagnosis in the problem list and acutely for this visit 

## 2022-05-01 ENCOUNTER — Other Ambulatory Visit (HOSPITAL_COMMUNITY)
Admission: RE | Admit: 2022-05-01 | Discharge: 2022-05-01 | Disposition: A | Discharge status: Home / Self Care | Payer: Medicare Other | Source: Skilled Nursing Facility | Attending: Adult Health | Admitting: Adult Health

## 2022-05-01 DIAGNOSIS — E119 Type 2 diabetes mellitus without complications: Secondary | ICD-10-CM | POA: Diagnosis not present

## 2022-05-01 LAB — COMPREHENSIVE METABOLIC PANEL
ALT: 28 U/L (ref 0–44)
AST: 24 U/L (ref 15–41)
Albumin: 2.8 g/dL — ABNORMAL LOW (ref 3.5–5.0)
Alkaline Phosphatase: 63 U/L (ref 38–126)
Anion gap: 10 (ref 5–15)
BUN: 42 mg/dL — ABNORMAL HIGH (ref 8–23)
CO2: 25 mmol/L (ref 22–32)
Calcium: 8 mg/dL — ABNORMAL LOW (ref 8.9–10.3)
Chloride: 104 mmol/L (ref 98–111)
Creatinine, Ser: 1.68 mg/dL — ABNORMAL HIGH (ref 0.44–1.00)
GFR, Estimated: 31 mL/min — ABNORMAL LOW (ref >60)
Glucose, Bld: 126 mg/dL — ABNORMAL HIGH (ref 70–99)
Potassium: 4.4 mmol/L (ref 3.5–5.1)
Sodium: 139 mmol/L (ref 135–145)
Total Bilirubin: 0.6 mg/dL (ref 0.3–1.2)
Total Protein: 5.5 g/dL — ABNORMAL LOW (ref 6.5–8.1)

## 2022-05-01 LAB — CBC WITH DIFFERENTIAL/PLATELET
Abs Immature Granulocytes: 0.07 10*3/uL (ref 0.00–0.07)
Basophils Absolute: 0.1 10*3/uL (ref 0.0–0.1)
Basophils Relative: 1 %
Eosinophils Absolute: 0.8 10*3/uL — ABNORMAL HIGH (ref 0.0–0.5)
Eosinophils Relative: 10 %
HCT: 29.7 % — ABNORMAL LOW (ref 36.0–46.0)
Hemoglobin: 9.5 g/dL — ABNORMAL LOW (ref 12.0–15.0)
Immature Granulocytes: 1 %
Lymphocytes Relative: 32 %
Lymphs Abs: 2.5 10*3/uL (ref 0.7–4.0)
MCH: 31 pg (ref 26.0–34.0)
MCHC: 32 g/dL (ref 30.0–36.0)
MCV: 97.1 fL (ref 80.0–100.0)
Monocytes Absolute: 0.9 10*3/uL (ref 0.1–1.0)
Monocytes Relative: 12 %
Neutro Abs: 3.4 10*3/uL (ref 1.7–7.7)
Neutrophils Relative %: 44 %
Platelets: 242 10*3/uL (ref 150–400)
RBC: 3.06 MIL/uL — ABNORMAL LOW (ref 3.87–5.11)
RDW: 14 % (ref 11.5–15.5)
WBC: 7.7 10*3/uL (ref 4.0–10.5)
nRBC: 0 % (ref 0.0–0.2)

## 2022-05-05 DIAGNOSIS — R7989 Other specified abnormal findings of blood chemistry: Secondary | ICD-10-CM | POA: Insufficient documentation

## 2022-05-13 ENCOUNTER — Non-Acute Institutional Stay (SKILLED_NURSING_FACILITY): Payer: Medicare Other | Admitting: Adult Health

## 2022-05-13 ENCOUNTER — Encounter: Payer: Self-pay | Admitting: Adult Health

## 2022-05-13 DIAGNOSIS — F0390 Unspecified dementia without behavioral disturbance: Secondary | ICD-10-CM | POA: Diagnosis not present

## 2022-05-13 DIAGNOSIS — F339 Major depressive disorder, recurrent, unspecified: Secondary | ICD-10-CM

## 2022-05-13 DIAGNOSIS — E440 Moderate protein-calorie malnutrition: Secondary | ICD-10-CM

## 2022-05-13 NOTE — Progress Notes (Signed)
Location:  [facility] Room Number: 101 Place of Service:  SNF (31)   CODE STATUS: dnr   No Known Allergies  Chief Complaint  Patient presents with   Medical Management of Chronic Issues                     Dementia without behavioral disturbance: Protein calorie malnutrition:Chronic depression      HPI:  She is a 77 year old long term resident of this facility being seen for the management of her chronic illnesses:   Dementia without behavioral disturbance: Protein calorie malnutrition:Chronic depression . We are awaiting second opinion regarding her rash. There are no reports of uncontrolled pain. Her weight is stable.   Past Medical History:  Diagnosis Date   CAD (coronary artery disease)    DM (diabetes mellitus)  with CKD    GFR 30   Glaucoma    HTN (hypertension)    Obesity    Osteoarthritis     Past Surgical History:  Procedure Laterality Date   CHOLECYSTECTOMY     CORONARY STENT PLACEMENT     x3   REFRACTIVE SURGERY     detached retina   TUBAL LIGATION     77 yrs old    Social History   Socioeconomic History   Marital status: Divorced    Spouse name: Not on file   Number of children: 2   Years of education: Not on file   Highest education level: Not on file  Occupational History   Occupation: retired  Tobacco Use   Smoking status: Every Day    Packs/day: 1.00    Types: Cigarettes   Smokeless tobacco: Not on file  Vaping Use   Vaping Use: Not on file  Substance and Sexual Activity   Alcohol use: Yes    Comment: rarely   Drug use: Never   Sexual activity: Not on file  Other Topics Concern   Not on file  Social History Narrative   Not on file   Social Determinants of Health   Financial Resource Strain: Not on file  Food Insecurity: Not on file  Transportation Needs: Not on file  Physical Activity: Not on file  Stress: Not on file  Social Connections: Not on file  Intimate Partner Violence: Not on file    Family History  Problem Relation Age of Onset   Stroke Father    Diabetes Sister    Hypertension Sister    Arthritis Other    Heart disease Other    Hypertension Other    Stroke Other    Kidney disease Other    Diabetes Other       VITAL SIGNS BP 120/73   Pulse 80   Temp 97.6 F (36.4 C)   Resp 18   Ht '5\' 4"'$  (1.626 m)   Wt 187 lb 3.2 oz (84.9 kg)   SpO2 99%   BMI 32.13 kg/m   Outpatient Encounter Medications as of 05/13/2022  Medication Sig Note   acetaminophen (TYLENOL) 650 MG CR tablet Take 650 mg by mouth every 6 (six) hours.    aspirin 81 MG tablet Take 81 mg by mouth daily.    atorvastatin (LIPITOR) 20 MG tablet Take 20 mg by mouth daily.    camphor-menthol (SARNA) lotion Apply 1 Application topically 2 (two) times daily.    cholecalciferol (VITAMIN D3) 25 MCG (1000 UNIT) tablet Take 1,000 Units by mouth daily.    clobetasol cream (TEMOVATE)  [id number] % Apply 1 Application topically 2 (two) times a week. Apply to labia/vaginal area twice a week. Once A Day on Mon, Fri    dorzolamide-timolol (COSOPT) 22.3-6.8 MG/ML ophthalmic solution Place 1 drop into both eyes daily.  Wait at least 5 minutes between multiple drops in same eye.    ferrous sulfate 325 (65 FE) MG tablet Take 325 mg by mouth every other day.    hydrocortisone 1 % ointment Apply 1 Application topically 3 (three) times daily.    Insulin Glargine (BASAGLAR KWIKPEN) 100 UNIT/ML Inject 25 Units into the skin at bedtime.    insulin lispro (HUMALOG KWIKPEN) 100 UNIT/ML KwikPen Inject 7 Units into the skin 3 (three) times daily. Inject 7 units subcutaneous with meals.    latanoprost (XALATAN) 0.005 % ophthalmic solution Place 1 drop into both eyes daily.    loratadine (CLARITIN) 10 MG tablet Take 10 mg by mouth daily.    NON FORMULARY Diet:Regular    PRESCRIPTION MEDICATION Endit Skin Protectant Ointment; topical Special Instructions: Apply to vaginal/perineal area bid. Twice A Day    [DISCONTINUED]  acetaminophen (TYLENOL) 650 MG CR tablet Take 650 mg by mouth every 6 (six) hours as needed for pain.    [DISCONTINUED] dapagliflozin propanediol (FARXIGA) 5 MG TABS tablet Take 5 mg by mouth daily. 04/30/2022: Severe pruritic inguinal and thigh dermatitis is most likely related to polyuria and possible superimposed fungal infection.Wilder Glade will be held and glucoses monitored.   No facility-administered encounter medications on file as of 05/13/2022.     SIGNIFICANT DIAGNOSTIC EXAMS  PREVIOUS   07-25-21: dexa: t score: -2.521  NO NEW EXAMS   PREVIOUS   05-21-21: hgb a1c 6.1; tsh 1.689; uric acid: 7.2  06-04-21: glucose 95; bun 35; creat 1.40; k+ 4.1; na++ 135; ca 8.4 protein 5.6; albumin 3.3 chol 107; ldl 50; trig 32; hdl 51 06-06-21: iron 31; tibc 249; vitamin D 51.87 06-07-21: wbc 5.0; hgb 8.6; hct 26.9; mcv 191.5 plt 175  07-08-21: hepatitis C nr; urine micro-albumin  54.5 09-26-21: hgb a1c 9.0   10-07-21: glucose 282; bun 47; creat 1.90; k+ 4.3; na++ 137; ca 8.8; gfr 27 11-06-21: glucose 104; bun 36; creat 1.77; k+ 4.4; na++ 137; ca 8.4; gfr 29 11-18-21: tsh 2.112  12-23-21: hgb a1c 10.0 01-09-22: wbc 10.0; hgb 10.2; hct 30.1; mcv 93.5 plt 209; glucose 256; bun 44; creat 1.74; k+ 4.4; na++ 135; ca 8.5; gfr 30; protein 6.2; albumin 3.0   TODAY  04-14-22: hgb A1c 7.0; ACR 134; micro-albumin 140.6 04-28-22: tsh 0.393   05-01-22: wbc 7.5; hgb 9.5; hct 29.7; mcv 97.1 plt 242; glucose 126; bun 42; creat 1.68; k+ 4.4; na++ 139; ca 8.0; gfr 31; protein 5.5 albumin 2.8    Review of Systems  Constitutional:  Negative for malaise/fatigue.  Respiratory:  Negative for cough and shortness of breath.   Cardiovascular:  Negative for chest pain, palpitations and leg swelling.  Gastrointestinal:  Negative for abdominal pain, constipation and heartburn.  Musculoskeletal:  Negative for back pain, joint pain and myalgias.  Skin:  Positive for rash.  Neurological:  Negative for dizziness.   Psychiatric/Behavioral:  The patient is not nervous/anxious.    Physical Exam Constitutional:      General: She is not in acute distress.    Appearance: She is well-developed. She is obese. She is not diaphoretic.  Neck:     Thyroid: No thyromegaly.  Cardiovascular:     Rate and Rhythm: Normal rate and regular rhythm.  Pulses: Normal pulses.     Heart sounds: Normal heart sounds.  Pulmonary:     Effort: Pulmonary effort is normal. No respiratory distress.     Breath sounds: Normal breath sounds.  Abdominal:     General: Bowel sounds are normal. There is no distension.     Palpations: Abdomen is soft.     Tenderness: There is no abdominal tenderness.  Musculoskeletal:        General: Normal range of motion.     Cervical back: Neck supple.     Right lower leg: No edema.     Left lower leg: No edema.  Lymphadenopathy:     Cervical: No cervical adenopathy.  Skin:    General: Skin is warm and dry.     Findings: Rash present.  Neurological:     Mental Status: She is alert. Mental status is at baseline.  Psychiatric:        Mood and Affect: Mood normal.       ASSESSMENT/ PLAN:  TODAY  Dementia without behavioral disturbance: weight is 187 pounds will monitor   2. Protein calorie malnutrition: protein 5.5 albumin 2.8; will continue supplements and will add prostat 30 mL with meals.   3. Chronic depression is off remeron due to her history of falls.     PREVIOUS   4. Chronic depression: is off remeron due to her history of falls.   5. Benign hypertension with CKD (chronic kidney disease) stage III: b/p 120/73 will continue lisinopril 5 mg daily will monitor   6. Post menopausal osteoporosis: t score -2.561 will monitor over time.   7. Controlled type 2 diabetes mellitus with stage 3 chronic kidney disease with long term current use of insulin:hgb A1c 10.0 will continue basaglar 25 units nightly and novolog 5 units with meals.   8. Stage 3 chronic kidney disease  due to type 2 diabetes mellitus: bun 42; creat 1.68; gfr 31  9. Groin rash: has been treated with oral steroids; abt; and topical steroids; awaiting second opinion   10.Hyperlipidemia associated with type 2 diabetes mellitus: ldl 50 will continue lipitor 20 mg daily   11. Anemia due to stage 3b chronic kidney disease: hgb 8.6; will continue iron daily  12. Increased intraocular pressure bilateral: will continue cosopt to both eyes and xalatan both eyes    Ok Edwards NP [facility]   call [phone]

## 2022-05-30 ENCOUNTER — Non-Acute Institutional Stay (SKILLED_NURSING_FACILITY): Payer: Medicare Other | Admitting: Adult Health

## 2022-05-30 ENCOUNTER — Encounter: Payer: Self-pay | Admitting: Adult Health

## 2022-05-30 DIAGNOSIS — I129 Hypertensive chronic kidney disease with stage 1 through stage 4 chronic kidney disease, or unspecified chronic kidney disease: Secondary | ICD-10-CM | POA: Diagnosis not present

## 2022-05-30 DIAGNOSIS — E1122 Type 2 diabetes mellitus with diabetic chronic kidney disease: Secondary | ICD-10-CM | POA: Diagnosis not present

## 2022-05-30 DIAGNOSIS — N183 Chronic kidney disease, stage 3 unspecified: Secondary | ICD-10-CM | POA: Diagnosis not present

## 2022-05-30 DIAGNOSIS — F0390 Unspecified dementia without behavioral disturbance: Secondary | ICD-10-CM | POA: Diagnosis not present

## 2022-05-30 DIAGNOSIS — N1831 Chronic kidney disease, stage 3a: Secondary | ICD-10-CM

## 2022-05-30 NOTE — Progress Notes (Unsigned)
Location:  [facility] Room Number: 101D Place of Service:  SNF (31)   CODE STATUS: DNR  No Known Allergies  Chief Complaint  Patient presents with   Acute Visit    Care Plan meeting     HPI:  We have come together for her care plan meeting. Family present. BIMS 5/15 mood 6/30: decreased energy; nervous. She is nonambulatory uses wheelchair with one fall without injury. She is dependent with her adl care. She is incontinent of bladder and bowel. Dietary: feeds self; on regular diet has a very good appetite; weight is 187.2 pounds with 10 pound increase in the past 6 months. Therapy: none at this time. Activities: participates daily. 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  Stage 3 chronic kidney disease due to type 2 diabetes mellitus Dementia without behavioral disturbance   we are awaiting second opinion for her rash. Her continues to have significant lower extremity edema.   Past Medical History:  Diagnosis Date   CAD (coronary artery disease)    DM (diabetes mellitus)  with CKD    GFR 30   Glaucoma    HTN (hypertension)    Obesity    Osteoarthritis     Past Surgical History:  Procedure Laterality Date   CHOLECYSTECTOMY     CORONARY STENT PLACEMENT     x3   REFRACTIVE SURGERY     detached retina   TUBAL LIGATION     77 yrs old    Social History   Socioeconomic History   Marital status: Divorced    Spouse name: Not on file   Number of children: 2   Years of education: Not on file   Highest education level: Not on file  Occupational History   Occupation: retired  Tobacco Use   Smoking status: Every Day    Packs/day: 1    Types: Cigarettes   Smokeless tobacco: Not on file  Vaping Use   Vaping Use: Not on file  Substance and Sexual Activity   Alcohol use: Yes    Comment: rarely   Drug use: Never   Sexual  activity: Not on file  Other Topics Concern   Not on file  Social History Narrative   Not on file   Social Determinants of Health   Financial Resource Strain: Not on file  Food Insecurity: Not on file  Transportation Needs: Not on file  Physical Activity: Not on file  Stress: Not on file  Social Connections: Not on file  Intimate Partner Violence: Not on file   Family History  Problem Relation Age of Onset   Stroke Father    Diabetes Sister    Hypertension Sister    Arthritis Other    Heart disease Other    Hypertension Other    Stroke Other    Kidney disease Other    Diabetes Other       VITAL SIGNS BP (!) 132/54   Pulse 60   Temp 97.7 F (36.5 C) (Temporal)   Resp 16   Ht 5\' 4"  (1.626 m)   Wt 187 lb 3.2 oz (84.9 kg)   SpO2 99%   BMI 32.13 kg/m   Outpatient Encounter Medications as of 05/30/2022  Medication Sig   acetaminophen (TYLENOL) 650 MG CR tablet Take 650 mg by mouth every 6 (six) hours.   aspirin 81 MG tablet Take 81 mg  by mouth daily.   atorvastatin (LIPITOR) 20 MG tablet Take 20 mg by mouth daily.   camphor-menthol (SARNA) lotion Apply 1 Application topically 2 (two) times daily.   cholecalciferol (VITAMIN D3) 25 MCG (1000 UNIT) tablet Take 1,000 Units by mouth daily.   clobetasol cream (TEMOVATE) AB-123456789 % Apply 1 Application topically 2 (two) times a week. Apply to labia/vaginal area twice a week. Once A Day on Mon, Fri   dorzolamide-timolol (COSOPT) 22.3-6.8 MG/ML ophthalmic solution Place 1 drop into both eyes daily.  Wait at least 5 minutes between multiple drops in same eye.   ferrous sulfate 325 (65 FE) MG tablet Take 325 mg by mouth every other day.   hydrocortisone 1 % ointment Apply 1 Application topically 3 (three) times daily.   Insulin Glargine (BASAGLAR KWIKPEN) 100 UNIT/ML Inject 25 Units into the skin at bedtime.   insulin lispro (HUMALOG KWIKPEN) 100 UNIT/ML KwikPen Inject 7 Units into the skin 3 (three) times daily. Inject 7 units  subcutaneous with meals.   latanoprost (XALATAN) 0.005 % ophthalmic solution Place 1 drop into both eyes daily.   loratadine (CLARITIN) 10 MG tablet Take 10 mg by mouth daily.   NON FORMULARY Diet:Regular   PRESCRIPTION MEDICATION Endit Skin Protectant Ointment; topical Special Instructions: Apply to vaginal/perineal area bid. Twice A Day   No facility-administered encounter medications on file as of 05/30/2022.     SIGNIFICANT DIAGNOSTIC EXAMS  PREVIOUS   07-25-21: dexa: t score: -2.521  NO NEW EXAMS   PREVIOUS   05-21-21: hgb a1c 6.1; tsh 1.689; uric acid: 7.2  06-04-21: glucose 95; bun 35; creat 1.40; k+ 4.1; na++ 135; ca 8.4 protein 5.6; albumin 3.3 chol 107; ldl 50; trig 32; hdl 51 06-06-21: iron 31; tibc 249; vitamin D 51.87 06-07-21: wbc 5.0; hgb 8.6; hct 26.9; mcv 191.5 plt 175  07-08-21: hepatitis C nr; urine micro-albumin  54.5 09-26-21: hgb a1c 9.0   10-07-21: glucose 282; bun 47; creat 1.90; k+ 4.3; na++ 137; ca 8.8; gfr 27 11-06-21: glucose 104; bun 36; creat 1.77; k+ 4.4; na++ 137; ca 8.4; gfr 29 11-18-21: tsh 2.112  12-23-21: hgb a1c 10.0 01-09-22: wbc 10.0; hgb 10.2; hct 30.1; mcv 93.5 plt 209; glucose 256; bun 44; creat 1.74; k+ 4.4; na++ 135; ca 8.5; gfr 30; protein 6.2; albumin 3.0  04-14-22: hgb A1c 7.0; ACR 134; micro-albumin 140.6 04-28-22: tsh 0.393   05-01-22: wbc 7.5; hgb 9.5; hct 29.7; mcv 97.1 plt 242; glucose 126; bun 42; creat 1.68; k+ 4.4; na++ 139; ca 8.0; gfr 31; protein 5.5 albumin 2.8   NO NEW LABS.   Review of Systems  Constitutional:  Negative for malaise/fatigue.  Respiratory:  Negative for cough and shortness of breath.   Cardiovascular:  Negative for chest pain, palpitations and leg swelling.  Gastrointestinal:  Negative for abdominal pain, constipation and heartburn.  Musculoskeletal:  Negative for back pain, joint pain and myalgias.  Skin:  Positive for rash.  Neurological:  Negative for dizziness.  Psychiatric/Behavioral:  The patient is not  nervous/anxious.     .Physical Exam Constitutional:      General: She is not in acute distress.    Appearance: She is well-developed. She is obese. She is not diaphoretic.  Neck:     Thyroid: No thyromegaly.  Cardiovascular:     Rate and Rhythm: Normal rate and regular rhythm.     Pulses: Normal pulses.     Heart sounds: Normal heart sounds.  Pulmonary:     Effort:  Pulmonary effort is normal. No respiratory distress.     Breath sounds: Normal breath sounds.  Abdominal:     General: Bowel sounds are normal. There is no distension.     Palpations: Abdomen is soft.     Tenderness: There is no abdominal tenderness.  Musculoskeletal:        General: Normal range of motion.     Cervical back: Neck supple.     Right lower leg: Edema present.     Left lower leg: Edema present.  Lymphadenopathy:     Cervical: No cervical adenopathy.  Skin:    General: Skin is warm and dry.  Neurological:     Mental Status: She is alert. Mental status is at baseline.  Psychiatric:        Mood and Affect: Mood normal.       ASSESSMENT/ PLAN:  TODAY  Hypertension associated with stage 3a chronic kidney disease due to type 2 diabetes mellitus Stage 3 chronic kidney disease due to type 2 diabetes mellitus Dementia without behavioral disturbance  Will begin lasix 20 mg daily for her lower extremity edema Will continue current plan of care Will continue to monitor her status.   Time spent with patient: 40 minutes: medications; family concerns; dietary.    Ok Edwards NP [facility]  call [phone]

## 2022-06-05 ENCOUNTER — Other Ambulatory Visit (HOSPITAL_COMMUNITY)
Admission: RE | Admit: 2022-06-05 | Discharge: 2022-06-05 | Disposition: A | Discharge status: Home / Self Care | Payer: Medicare Other | Source: Skilled Nursing Facility | Attending: Adult Health | Admitting: Adult Health

## 2022-06-05 DIAGNOSIS — I1310 Hypertensive heart and chronic kidney disease without heart failure, with stage 1 through stage 4 chronic kidney disease, or unspecified chronic kidney disease: Secondary | ICD-10-CM | POA: Insufficient documentation

## 2022-06-05 LAB — BASIC METABOLIC PANEL
Anion gap: 9 (ref 5–15)
BUN: 59 mg/dL — ABNORMAL HIGH (ref 8–23)
CO2: 21 mmol/L — ABNORMAL LOW (ref 22–32)
Calcium: 8.2 mg/dL — ABNORMAL LOW (ref 8.9–10.3)
Chloride: 108 mmol/L (ref 98–111)
Creatinine, Ser: 1.82 mg/dL — ABNORMAL HIGH (ref 0.44–1.00)
GFR, Estimated: 28 mL/min — ABNORMAL LOW (ref >60)
Glucose, Bld: 113 mg/dL — ABNORMAL HIGH (ref 70–99)
Potassium: 4 mmol/L (ref 3.5–5.1)
Sodium: 138 mmol/L (ref 135–145)

## 2022-06-09 ENCOUNTER — Non-Acute Institutional Stay (SKILLED_NURSING_FACILITY): Payer: Medicare Other | Admitting: Adult Health

## 2022-06-09 ENCOUNTER — Encounter: Payer: Self-pay | Admitting: Adult Health

## 2022-06-09 DIAGNOSIS — E1122 Type 2 diabetes mellitus with diabetic chronic kidney disease: Secondary | ICD-10-CM

## 2022-06-09 DIAGNOSIS — F339 Major depressive disorder, recurrent, unspecified: Secondary | ICD-10-CM | POA: Diagnosis not present

## 2022-06-09 DIAGNOSIS — M810 Age-related osteoporosis without current pathological fracture: Secondary | ICD-10-CM

## 2022-06-09 DIAGNOSIS — I129 Hypertensive chronic kidney disease with stage 1 through stage 4 chronic kidney disease, or unspecified chronic kidney disease: Secondary | ICD-10-CM | POA: Diagnosis not present

## 2022-06-09 DIAGNOSIS — N1831 Chronic kidney disease, stage 3a: Secondary | ICD-10-CM

## 2022-06-09 NOTE — Progress Notes (Signed)
Location:  [facility] Room Number: 101D Place of Service:  SNF (31)   CODE STATUS: DNR  No Known Allergies  Chief Complaint  Patient presents with   Medical Management of Chronic Issues                    Chronic depression:  hypertension associated with stage 3a chronic kidney disease due to type 2 diabetes mellitus :  Post menopausal osteopenia     HPI:  She is a 77 year old long term resident of this facility being seen for the management of her chronic illnesses:   Chronic depression: hypertension associated with stage 3a chronic kidney disease due to type 2 diabetes mellitus  :  Post menopausal osteopenia.  There are no reports of uncontrolled pain. We are still waiting for the second opinion for her rash. She continues to get out of bed daily and does socialize with others.   Past Medical History:  Diagnosis Date   CAD (coronary artery disease)    DM (diabetes mellitus)  with CKD    GFR 30   Glaucoma    HTN (hypertension)    Obesity    Osteoarthritis     Past Surgical History:  Procedure Laterality Date   CHOLECYSTECTOMY     CORONARY STENT PLACEMENT     x3   REFRACTIVE SURGERY     detached retina   TUBAL LIGATION     77 yrs old    Social History   Socioeconomic History   Marital status: Divorced    Spouse name: Not on file   Number of children: 2   Years of education: Not on file   Highest education level: Not on file  Occupational History   Occupation: retired  Tobacco Use   Smoking status: Every Day    Packs/day: 1    Types: Cigarettes   Smokeless tobacco: Not on file  Vaping Use   Vaping Use: Not on file  Substance and Sexual Activity   Alcohol use: Yes    Comment: rarely   Drug use: Never   Sexual activity: Not on file  Other Topics Concern   Not on file  Social History Narrative   Not on file   Social Determinants of Health   Financial Resource Strain: Not on file  Food Insecurity: Not on file  Transportation  Needs: Not on file  Physical Activity: Not on file  Stress: Not on file  Social Connections: Not on file  Intimate Partner Violence: Not on file   Family History  Problem Relation Age of Onset   Stroke Father    Diabetes Sister    Hypertension Sister    Arthritis Other    Heart disease Other    Hypertension Other    Stroke Other    Kidney disease Other    Diabetes Other       VITAL SIGNS BP 138/78   Pulse 78   Temp 98.6 F (37 C)   Resp 18   Ht 5\' 4"  (1.626 m)   Wt 187 lb 2 oz (84.9 kg)   SpO2 92%   BMI 32.12 kg/m   Outpatient Encounter Medications as of 06/09/2022  Medication Sig   acetaminophen (TYLENOL) 650 MG CR tablet Take 650 mg by mouth every 6 (six) hours.   Amino Acids-Protein Hydrolys (FEEDING SUPPLEMENT, PRO-STAT SUGAR FREE 64,) LIQD Take 30 mLs by mouth 3 (three) times daily with meals.   aspirin 81 MG tablet  Take 81 mg by mouth daily.   atorvastatin (LIPITOR) 20 MG tablet Take 20 mg by mouth daily.   camphor-menthol (SARNA) lotion Apply 1 Application topically 2 (two) times daily.   cholecalciferol (VITAMIN D3) 25 MCG (1000 UNIT) tablet Take 1,000 Units by mouth daily.   clobetasol cream (TEMOVATE) 0.05 % Apply 1 Application topically 2 (two) times a week. Apply to labia/vaginal area twice a week. Once A Day on Mon, Fri   dorzolamide-timolol (COSOPT) 22.3-6.8 MG/ML ophthalmic solution Place 1 drop into both eyes daily.  Wait at least 5 minutes between multiple drops in same eye.   ferrous sulfate 325 (65 FE) MG tablet Take 325 mg by mouth every other day.   hydrocortisone 1 % ointment Apply 1 Application topically 3 (three) times daily.   Insulin Glargine (BASAGLAR KWIKPEN) 100 UNIT/ML Inject 25 Units into the skin at bedtime.   insulin lispro (HUMALOG KWIKPEN) 100 UNIT/ML KwikPen Inject 7 Units into the skin 3 (three) times daily. Inject 7 units subcutaneous with meals.   latanoprost (XALATAN) 0.005 % ophthalmic solution Place 1 drop into both eyes daily.    loratadine (CLARITIN) 10 MG tablet Take 10 mg by mouth daily.   NON FORMULARY Diet:Regular   predniSONE (DELTASONE) 10 MG tablet Take 50 mg by mouth daily with breakfast.   PRESCRIPTION MEDICATION Endit Skin Protectant Ointment; topical Special Instructions: Apply to vaginal/perineal area bid. Twice A Day   No facility-administered encounter medications on file as of 06/09/2022.     SIGNIFICANT DIAGNOSTIC EXAMS  PREVIOUS   07-25-21: dexa: t score: -2.521  NO NEW EXAMS   PREVIOUS   07-08-21: hepatitis C nr; urine micro-albumin  54.5 09-26-21: hgb a1c 9.0   10-07-21: glucose 282; bun 47; creat 1.90; k+ 4.3; na++ 137; ca 8.8; gfr 27 11-06-21: glucose 104; bun 36; creat 1.77; k+ 4.4; na++ 137; ca 8.4; gfr 29 11-18-21: tsh 2.112  12-23-21: hgb a1c 10.0 01-09-22: wbc 10.0; hgb 10.2; hct 30.1; mcv 93.5 plt 209; glucose 256; bun 44; creat 1.74; k+ 4.4; na++ 135; ca 8.5; gfr 30; protein 6.2; albumin 3.0  04-14-22: hgb A1c 7.0; ACR 134; micro-albumin 140.6 04-28-22: tsh 0.393   05-01-22: wbc 7.5; hgb 9.5; hct 29.7; mcv 97.1 plt 242; glucose 126; bun 42; creat 1.68; k+ 4.4; na++ 139; ca 8.0; gfr 31; protein 5.5 albumin 2.8   NO NEW LABS   Review of Systems  Constitutional:  Negative for malaise/fatigue.  Respiratory:  Negative for cough and shortness of breath.   Cardiovascular:  Negative for chest pain, palpitations and leg swelling.  Gastrointestinal:  Negative for abdominal pain, constipation and heartburn.  Musculoskeletal:  Negative for back pain, joint pain and myalgias.  Skin:  Positive for itching and rash.  Neurological:  Negative for dizziness.  Psychiatric/Behavioral:  The patient is not nervous/anxious.    Physical Exam Constitutional:      General: She is not in acute distress.    Appearance: She is well-developed. She is obese. She is not diaphoretic.  Neck:     Thyroid: No thyromegaly.  Cardiovascular:     Rate and Rhythm: Normal rate and regular rhythm.     Pulses:  Normal pulses.     Heart sounds: Normal heart sounds.  Pulmonary:     Effort: Pulmonary effort is normal. No respiratory distress.     Breath sounds: Normal breath sounds.  Abdominal:     General: Bowel sounds are normal. There is no distension.     Palpations:  Abdomen is soft.     Tenderness: There is no abdominal tenderness.  Musculoskeletal:        General: Normal range of motion.     Cervical back: Neck supple.     Right lower leg: Edema present.     Left lower leg: Edema present.  Lymphadenopathy:     Cervical: No cervical adenopathy.  Skin:    General: Skin is warm and dry.     Comments: Rash in groin and abdomen red; irritated; scratch marks present   Neurological:     Mental Status: She is alert. Mental status is at baseline.  Psychiatric:        Mood and Affect: Mood normal.     ASSESSMENT/ PLAN:  TODAY  Chronic depression: is off remeron due to history of falls  2.  hypertension associated with stage 3a chronic kidney disease due to type 2 diabetes mellitus: b/p 138/78 will continue lisinopril 5 mg daily   3. Post menopausal osteopenia: t score -2.561 will monitor     PREVIOUS   4. Controlled type 2 diabetes mellitus with stage 3 chronic kidney disease with long term current use of insulin:hgb A1c 7.0 will continue basaglar 25 units nightly and novolog 7 units with meals.   5. Stage 3 chronic kidney disease due to type 2 diabetes mellitus: bun 42; creat 1.68; gfr 31  6. Groin rash: has been treated with oral steroids; abt; and topical steroids; awaiting second opinion   7.Hyperlipidemia associated with type 2 diabetes mellitus: ldl 50 will continue lipitor 20 mg daily   8. Anemia due to stage 3b chronic kidney disease: hgb 8.6; will continue iron daily  9. Increased intraocular pressure bilateral: will continue cosopt to both eyes and xalatan both eyes   10. Dementia without behavioral disturbance: weight is 187 pounds will monitor   11. Protein calorie  malnutrition: protein 5.5 albumin 2.8; will continue supplements and will add prostat 30 mL with meals.   12. Chronic depression is off remeron due to her history of falls.      Synthia Innocent NP [facility]  call [phone]

## 2022-06-11 ENCOUNTER — Non-Acute Institutional Stay (SKILLED_NURSING_FACILITY): Payer: Medicare Other | Admitting: Adult Health

## 2022-06-11 ENCOUNTER — Encounter: Payer: Self-pay | Admitting: Adult Health

## 2022-06-11 DIAGNOSIS — R635 Abnormal weight gain: Secondary | ICD-10-CM

## 2022-06-11 DIAGNOSIS — E440 Moderate protein-calorie malnutrition: Secondary | ICD-10-CM | POA: Diagnosis not present

## 2022-06-11 NOTE — Progress Notes (Signed)
Location:  [facility] Room Number: 101D Place of Service:  SNF (31)   CODE STATUS: DNR  No Known Allergies  Chief Complaint  Patient presents with   Acute Visit    Weight issues      HPI:  She has been gaining weight. 180 days ago was 167.2 pounds; April weight os 189 pounds. This represents a 13% weight gain over the past 6 months.  She continues to have a rash; is due to dermatology at [facility] on 06-20-22. There is concern that she could be gluten intolerant; due to her rash and bloating. She does go to the bathroom frequently. She has an excellent appetite; does eat snacks daily.   Past Medical History:  Diagnosis Date   CAD (coronary artery disease)    DM (diabetes mellitus)  with CKD    GFR 30   Glaucoma    HTN (hypertension)    Obesity    Osteoarthritis     Past Surgical History:  Procedure Laterality Date   CHOLECYSTECTOMY     CORONARY STENT PLACEMENT     x3   REFRACTIVE SURGERY     detached retina   TUBAL LIGATION     77 yrs old    Social History   Socioeconomic History   Marital status: Divorced    Spouse name: Not on file   Number of children: 2   Years of education: Not on file   Highest education level: Not on file  Occupational History   Occupation: retired  Tobacco Use   Smoking status: Every Day    Packs/day: 1    Types: Cigarettes   Smokeless tobacco: Not on file  Vaping Use   Vaping Use: Not on file  Substance and Sexual Activity   Alcohol use: Yes    Comment: rarely   Drug use: Never   Sexual activity: Not on file  Other Topics Concern   Not on file  Social History Narrative   Not on file   Social Determinants of Health   Financial Resource Strain: Not on file  Food Insecurity: Not on file  Transportation Needs: Not on file  Physical Activity: Not on file  Stress: Not on file  Social Connections: Not on file  Intimate Partner Violence: Not on file   Family History  Problem Relation Age of Onset    Stroke Father    Diabetes Sister    Hypertension Sister    Arthritis Other    Heart disease Other    Hypertension Other    Stroke Other    Kidney disease Other    Diabetes Other       VITAL SIGNS BP (!) 140/78   Pulse 78   Temp 98.6 F (37 C) (Temporal)   Resp (!) 22   Ht 5\' 4"  (1.626 m)   Wt 189 lb 6.4 oz (85.9 kg)   SpO2 92%   BMI 32.51 kg/m   Outpatient Encounter Medications as of 06/11/2022  Medication Sig   acetaminophen (TYLENOL) 650 MG CR tablet Take 650 mg by mouth 3 (three) times daily.   Amino Acids-Protein Hydrolys (FEEDING SUPPLEMENT, PRO-STAT SUGAR FREE 64,) LIQD Take 30 mLs by mouth 3 (three) times daily with meals.   aspirin 81 MG tablet Take 81 mg by mouth daily.   atorvastatin (LIPITOR) 20 MG tablet Take 20 mg by mouth daily.   camphor-menthol (SARNA) lotion Apply 1 Application topically 2 (two) times daily.   clobetasol cream (TEMOVATE) 0.05 %  Apply 1 Application topically 2 (two) times a week. Apply to labia/vaginal area twice a week. Once A Day on Mon, Fri   dorzolamide-timolol (COSOPT) 22.3-6.8 MG/ML ophthalmic solution Place 1 drop into both eyes daily.  Wait at least 5 minutes between multiple drops in same eye.   ferrous sulfate 325 (65 FE) MG tablet Take 325 mg by mouth as directed. On Monday and Thursday   hydrocortisone 1 % ointment Apply 1 Application topically 3 (three) times daily.   Insulin Glargine (BASAGLAR KWIKPEN) 100 UNIT/ML Inject 25 Units into the skin at bedtime.   insulin lispro (HUMALOG KWIKPEN) 100 UNIT/ML KwikPen Inject 7 Units into the skin 3 (three) times daily. Inject 7 units subcutaneous with meals.   latanoprost (XALATAN) 0.005 % ophthalmic solution Place 1 drop into both eyes daily.   loratadine (CLARITIN) 10 MG tablet Take 10 mg by mouth daily.   NON FORMULARY Diet:Regular   predniSONE (DELTASONE) 10 MG tablet Take 30 mg by mouth daily with breakfast.   PRESCRIPTION MEDICATION Endit Skin Protectant Ointment; topical Special  Instructions: Apply to vaginal/perineal area bid. Twice A Day   [DISCONTINUED] cholecalciferol (VITAMIN D3) 25 MCG (1000 UNIT) tablet Take 1,000 Units by mouth daily.   No facility-administered encounter medications on file as of 06/11/2022.     SIGNIFICANT DIAGNOSTIC EXAMS  PREVIOUS   07-25-21: dexa: t score: -2.521  NO NEW EXAMS   PREVIOUS   07-08-21: hepatitis C nr; urine micro-albumin  54.5 09-26-21: hgb a1c 9.0   10-07-21: glucose 282; bun 47; creat 1.90; k+ 4.3; na++ 137; ca 8.8; gfr 27 11-06-21: glucose 104; bun 36; creat 1.77; k+ 4.4; na++ 137; ca 8.4; gfr 29 11-18-21: tsh 2.112  12-23-21: hgb a1c 10.0 01-09-22: wbc 10.0; hgb 10.2; hct 30.1; mcv 93.5 plt 209; glucose 256; bun 44; creat 1.74; k+ 4.4; na++ 135; ca 8.5; gfr 30; protein 6.2; albumin 3.0  04-14-22: hgb A1c 7.0; ACR 134; micro-albumin 140.6 04-28-22: tsh 0.393   05-01-22: wbc 7.5; hgb 9.5; hct 29.7; mcv 97.1 plt 242; glucose 126; bun 42; creat 1.68; k+ 4.4; na++ 139; ca 8.0; gfr 31; protein 5.5 albumin 2.8   NO NEW LABS   Review of Systems  Constitutional:  Negative for malaise/fatigue.  Respiratory:  Negative for cough and shortness of breath.   Cardiovascular:  Negative for chest pain, palpitations and leg swelling.  Gastrointestinal:  Negative for abdominal pain, constipation and heartburn.  Musculoskeletal:  Negative for back pain, joint pain and myalgias.  Skin:  Positive for itching and rash.  Neurological:  Negative for dizziness.  Psychiatric/Behavioral:  The patient is not nervous/anxious.    Physical Exam Constitutional:      General: She is not in acute distress.    Appearance: She is well-developed. She is not diaphoretic.  Neck:     Thyroid: No thyromegaly.  Cardiovascular:     Rate and Rhythm: Normal rate and regular rhythm.     Pulses: Normal pulses.     Heart sounds: Normal heart sounds.  Pulmonary:     Effort: Pulmonary effort is normal. No respiratory distress.     Breath sounds: Normal  breath sounds.  Abdominal:     General: Bowel sounds are normal. There is no distension.     Palpations: Abdomen is soft.     Tenderness: There is no abdominal tenderness.  Musculoskeletal:        General: Normal range of motion.     Cervical back: Neck supple.  Right lower leg: No edema.     Left lower leg: No edema.  Lymphadenopathy:     Cervical: No cervical adenopathy.  Skin:    General: Skin is warm and dry.     Findings: Erythema and rash present.  Neurological:     Mental Status: She is alert. Mental status is at baseline.  Psychiatric:        Mood and Affect: Mood normal.        ASSESSMENT/ PLAN:  TODAY  Weight gain Moderate protein calorie malnutrition  Will not make changes at this time;  Will check cmp    Synthia Innocent NP [facility]  call [phone]

## 2022-06-16 ENCOUNTER — Non-Acute Institutional Stay (INDEPENDENT_AMBULATORY_CARE_PROVIDER_SITE_OTHER): Payer: Medicare Other | Admitting: Adult Health

## 2022-06-16 ENCOUNTER — Encounter: Payer: Self-pay | Admitting: Adult Health

## 2022-06-16 DIAGNOSIS — Z0000 Encounter for general adult medical examination without abnormal findings: Secondary | ICD-10-CM | POA: Diagnosis not present

## 2022-06-16 NOTE — Progress Notes (Signed)
Subjective:   Paige Wilkins is a 77 y.o. female who presents for Medicare Annual (Subsequent) preventive examination.  Review of Systems    Review of Systems  Constitutional:  Negative for malaise/fatigue.  Respiratory:  Negative for cough and shortness of breath.   Cardiovascular:  Negative for chest pain, palpitations and leg swelling.  Gastrointestinal:  Negative for abdominal pain, constipation and heartburn.  Musculoskeletal:  Negative for back pain, joint pain and myalgias.  Skin: Negative.   Neurological:  Negative for dizziness.  Psychiatric/Behavioral:  The patient is not nervous/anxious.     Cardiac Risk Factors include: advanced age (>71men, >62 women);diabetes mellitus;dyslipidemia;hypertension;obesity (BMI >30kg/m2);sedentary lifestyle     Objective:    Today's Vitals   06/16/22 1006  BP: (!) 148/65  Pulse: 66  Resp: 18  SpO2: 100%  Weight: 189 lb (85.7 kg)  Height: 5\' 4"  (1.626 m)   Body mass index is 32.44 kg/m.     06/16/2022   10:09 AM 06/11/2022    4:56 PM 06/09/2022   11:15 AM 05/30/2022    2:57 PM 04/10/2022   10:01 AM 03/12/2022   11:16 AM 03/11/2022    9:47 AM  Advanced Directives  Does Patient Have a Medical Advance Directive? Yes Yes Yes Yes Yes Yes Yes  Type of Advance Directive Out of facility DNR (pink MOST or yellow form) Out of facility DNR (pink MOST or yellow form) Out of facility DNR (pink MOST or yellow form) Out of facility DNR (pink MOST or yellow form) Out of facility DNR (pink MOST or yellow form) Healthcare Power of [place];Out of facility DNR (pink MOST or yellow form) Healthcare Power of [place];Out of facility DNR (pink MOST or yellow form)  Does patient want to make changes to medical advance directive? No - Patient declined No - Patient declined No - Patient declined No - Patient declined No - Patient declined No - Patient declined No - Patient declined  Copy of Healthcare Power of Attorney in Chart?      Yes - validated most  recent copy scanned in chart (See row information) Yes - validated most recent copy scanned in chart (See row information)  Pre-existing out of facility DNR order (yellow form or pink MOST form) Pink MOST/Yellow Form most recent copy in chart - Physician notified to receive inpatient order;Yellow form placed in chart (order not valid for inpatient use) Pink MOST/Yellow Form most recent copy in chart - Physician notified to receive inpatient order Pink MOST/Yellow Form most recent copy in chart - Physician notified to receive inpatient order Pink MOST/Yellow Form most recent copy in chart - Physician notified to receive inpatient order Pink MOST/Yellow Form most recent copy in chart - Physician notified to receive inpatient order      Current Medications (verified) Outpatient Encounter Medications as of 06/16/2022  Medication Sig   acetaminophen (TYLENOL) 650 MG CR tablet Take 650 mg by mouth 3 (three) times daily.   Amino Acids-Protein Hydrolys (FEEDING SUPPLEMENT, PRO-STAT SUGAR FREE 64,) LIQD Take 30 mLs by mouth 3 (three) times daily with meals.   aspirin 81 MG tablet Take 81 mg by mouth daily.   atorvastatin (LIPITOR) 20 MG tablet Take 20 mg by mouth daily.   calcium-vitamin D (OSCAL WITH D) 500-5 MG-MCG tablet Take 1 tablet by mouth daily.   camphor-menthol (SARNA) lotion Apply 1 Application topically 2 (two) times daily.   clobetasol cream (TEMOVATE) 0.05 % Apply 1 Application topically 2 (two) times a week. Apply to  labia/vaginal area twice a week. Once A Day on Mon, Fri   dorzolamide-timolol (COSOPT) 22.3-6.8 MG/ML ophthalmic solution Place 1 drop into both eyes daily.  Wait at least 5 minutes between multiple drops in same eye.   ferrous sulfate 325 (65 FE) MG tablet Take 325 mg by mouth as directed. On Monday and Thursday   furosemide (LASIX) 20 MG tablet Take 20 mg by mouth daily.   hydrocortisone 1 % ointment Apply 1 Application topically 3 (three) times daily.   Insulin Glargine  (BASAGLAR KWIKPEN) 100 UNIT/ML Inject 25 Units into the skin at bedtime.   insulin lispro (HUMALOG KWIKPEN) 100 UNIT/ML KwikPen Inject 7 Units into the skin 3 (three) times daily. Inject 7 units subcutaneous with meals.   latanoprost (XALATAN) 0.005 % ophthalmic solution Place 1 drop into both eyes daily.   loperamide (IMODIUM A-D) 2 MG tablet Take 2 mg by mouth every 6 (six) hours as needed for diarrhea or loose stools.   loratadine (CLARITIN) 10 MG tablet Take 10 mg by mouth daily.   NON FORMULARY Diet:Regular   predniSONE (DELTASONE) 10 MG tablet Take 30 mg by mouth daily with breakfast.   PRESCRIPTION MEDICATION Endit Skin Protectant Ointment; topical Special Instructions: Apply to vaginal/perineal area bid. Twice A Day   [DISCONTINUED] cholecalciferol (VITAMIN D3) 25 MCG (1000 UNIT) tablet Take 1,000 Units by mouth daily.   No facility-administered encounter medications on file as of 06/16/2022.    Allergies (verified) Patient has no known allergies.   History: Past Medical History:  Diagnosis Date   CAD (coronary artery disease)    DM (diabetes mellitus)  with CKD    GFR 30   Glaucoma    HTN (hypertension)    Obesity    Osteoarthritis    Past Surgical History:  Procedure Laterality Date   CHOLECYSTECTOMY     CORONARY STENT PLACEMENT     x3   REFRACTIVE SURGERY     detached retina   TUBAL LIGATION     77 yrs old   Family History  Problem Relation Age of Onset   Stroke Father    Diabetes Sister    Hypertension Sister    Arthritis Other    Heart disease Other    Hypertension Other    Stroke Other    Kidney disease Other    Diabetes Other    Social History   Socioeconomic History   Marital status: Divorced    Spouse name: Not on file   Number of children: 2   Years of education: Not on file   Highest education level: Not on file  Occupational History   Occupation: retired  Tobacco Use   Smoking status: Every Day    Packs/day: 1    Types: Cigarettes    Smokeless tobacco: Not on file  Vaping Use   Vaping Use: Not on file  Substance and Sexual Activity   Alcohol use: Yes    Comment: rarely   Drug use: Never   Sexual activity: Not on file  Other Topics Concern   Not on file  Social History Narrative   Not on file   Social Determinants of Health   Financial Resource Strain: Not on file  Food Insecurity: Not on file  Transportation Needs: Not on file  Physical Activity: Not on file  Stress: Not on file  Social Connections: Not on file    Tobacco Counseling Ready to quit: Not Answered Counseling given: Not Answered   Clinical Intake:  Pre-visit preparation  completed: Yes  Pain : No/denies pain     BMI - recorded: 32.44 Nutritional Status: BMI > 30  Obese Nutritional Risks: Unintentional weight gain Diabetes: Yes CBG done?: Yes CBG resulted in Enter/ Edit results?: Yes Did pt. bring in CBG monitor from home?: No  How often do you need to have someone help you when you read instructions, pamphlets, or other written materials from your doctor or pharmacy?: 5 - Always  Diabetic?yes  Interpreter Needed?: No  Comments: long term resident of [facility]   Activities of Daily Living    06/16/2022    1:23 PM  In your present state of health, do you have any difficulty performing the following activities:  Hearing? 0  Vision? 0  Difficulty concentrating or making decisions? 1  Walking or climbing stairs? 1  Dressing or bathing? 1  Doing errands, shopping? 1  Preparing Food and eating ? Y  Using the Toilet? Y  In the past six months, have you accidently leaked urine? Y  Do you have problems with loss of bowel control? Y  Managing your Medications? Y  Managing your Finances? Y  Housekeeping or managing your Housekeeping? Y    Patient Care Team: Sharee Holster, NP as PCP - [facility] ([facility]iatric [facility]  Indicate any recent Medical Services you may have received from  other than [facility] providers in the past year (date may be approximate).     Assessment:   This is a routine wellness examination for Nandika.  Hearing/Vision screen No results found.  Dietary issues and exercise activities discussed: Current Exercise Habits: The patient does not participate in regular exercise at present, Exercise limited by: None identified   Goals Addressed             This Visit's Progress    Absence of Fall and Fall-Related Injury   Not on track    Evidence-based guidance:  Assess fall risk using a validated tool when available. Consider balance and gait impairment, muscle weakness, diminished vision or hearing, environmental hazards, presence of urinary or bowel urgency and/or incontinence.  Communicate fall injury risk to interprofessional healthcare team.  Develop a fall prevention plan with the patient and family.  Promote use of personal vision and auditory aids.  Promote reorientation, appropriate sensory stimulation, and routines to decrease risk of fall when changes in mental status are present.  Assess assistance level required for safe and effective self-care; consider referral for home care.  Encourage physical activity, such as performance of self-care at highest level of ability, strength and balance exercise program, and provision of appropriate assistive devices; refer to rehabilitation therapy.  Refer to community-based fall prevention program where available.  If fall occurs, determine the cause and revise fall injury prevention plan.  Regularly review medication contribution to fall risk; consider risk related to polypharmacy and age.  Refer to pharmacist for consultation when concerns about medications are revealed.  Balance adequate pain management with potential for oversedation.  Provide guidance related to environmental modifications.  Consider supplementation with Vitamin D.   Notes:      Follow up with Primary Care Provider   On track     General - Client will not be readmitted within 30 days ([facility])   On track      Depression Screen    06/16/2022    1:23 PM 06/09/2022   11:13 AM 04/10/2022   12:10 PM 08/28/2021   12:40 PM 06/10/2021    2:13 PM  PHQ 2/9 Scores  PHQ - 2 Score 0 0 0 0 0  PHQ- 9 Score   0 0     Fall Risk    06/16/2022    1:22 PM 06/09/2022   11:13 AM 05/13/2022   10:26 AM 04/10/2022   12:09 PM 06/10/2021    2:13 PM  Fall Risk   Falls in the past year? 1 0 1 0 0  Number falls in past yr: 1 0 0 0 0  Injury with Fall? 0 0 0 0 0  Risk for fall due to : History of fall(s);Impaired balance/gait;Impaired mobility History of fall(s);Impaired balance/gait;Impaired mobility History of fall(s);Impaired balance/gait;Impaired mobility Impaired balance/gait;Impaired mobility Impaired balance/gait;Impaired mobility  Follow up Falls evaluation completed Falls evaluation completed       FALL RISK PREVENTION PERTAINING TO THE HOME:  Any stairs in or around the home? Yes  If so, are there any without handrails? No  Home free of loose throw rugs in walkways, pet beds, electrical cords, etc? Yes  Adequate lighting in your home to reduce risk of falls? Yes   ASSISTIVE DEVICES UTILIZED TO PREVENT FALLS:  Life alert? No  Use of a cane, walker or w/c? Yes  Grab bars in the bathroom? Yes  Shower chair or bench in shower? Yes  Elevated toilet seat or a handicapped toilet? Yes   TIMED UP AND GO:  Was the test performed? No .  Nonambulatory   Cognitive Function:    06/16/2022    1:24 PM 06/10/2021    2:14 PM  MMSE - Mini Mental State Exam  Not completed:  Unable to complete  Orientation to time 3   Orientation to Place 1   Registration 3   Attention/ Calculation 1   Recall 1   Language- name 2 objects 2   Language- repeat 1   Language- follow 3 step command 1   Language- read & follow direction 1   Write a sentence 0   Copy design 0   Total score 14         06/16/2022    1:25 PM  6CIT Screen  What Year? 0  points  What month? 0 points  What time? 0 points  Count back from 20 4 points  Months in reverse 4 points  Repeat phrase 4 points  Total Score 12 points    Immunizations Immunization History  Administered Date(s) Administered   Fluad Quad(high Dose 65+) 12/10/2021   Influenza, High Dose Seasonal PF 12/20/2019   Influenza,inj,Quad PF,6+ Mos 01/15/2021   Influenza,inj,quad, With Preservative 01/09/2015   Moderna Covid-19 Vaccine Bivalent Booster 2845yrs & up 07/24/2020   PFIZER Comirnaty(Gray Top)Covid-19 Tri-Sucrose Vaccine 07/24/2020   PFIZER(Purple Top)SARS-COV-2 Vaccination 04/28/2019, 05/18/2019, 02/27/2020   PNEUMOCOCCAL CONJUGATE-20 07/12/2021, 08/23/2021   Pfizer Covid-19 Vaccine Bivalent Booster 1042yrs & up 02/27/2020, 07/30/2021, 01/01/2022   Pneumococcal-Unspecified 01/09/2015   Rsv, Bivalent, Protein Subunit Rsvpref,pf Verdis Frederickson(Abrysvo) 02/10/2022   Tdap 07/05/2015, 06/07/2021   Zoster Recombinat (Shingrix) 07/05/2021, 10/02/2021    TDAP status: Up to date  Flu Vaccine status: Up to date  Pneumococcal vaccine status: Up to date  Covid-19 vaccine status: Completed vaccines  Qualifies for Shingles Vaccine? Yes   Zostavax completed Yes   Shingrix Completed?: No.    Education has been provided regarding the importance of this vaccine. Patient has been advised to call insurance company to determine out of pocket expense if they have not yet received this vaccine. Advised may also receive vaccine at local pharmacy or  Health Dept. Verbalized acceptance and understanding.  Screening Tests Health Maintenance  Topic Date Due   Medicare Annual Wellness (AWV)  06/11/2022   COVID-19 Vaccine (9 - 2023-24 season) 12/09/2022 (Originally 02/26/2022)   OPHTHALMOLOGY EXAM  01/09/2023 (Originally 03/21/1955)   INFLUENZA VACCINE  10/09/2022   HEMOGLOBIN A1C  10/13/2022   FOOT EXAM  04/11/2023   Diabetic kidney evaluation - Urine ACR  04/15/2023   Lung Cancer Screening  05/13/2023    Diabetic kidney evaluation - eGFR measurement  06/05/2023   DTaP/Tdap/Td (3 - Td or Tdap) 06/08/2031   Pneumonia Vaccine 50+ Years old  Completed   DEXA SCAN  Completed   Hepatitis C Screening  Completed   Zoster Vaccines- Shingrix  Completed   HPV VACCINES  Aged Out    Health Maintenance  Health Maintenance Due  Topic Date Due   Medicare Annual Wellness (AWV)  06/11/2022    Colorectal cancer screening: Type of screening: Cologuard. Completed  . Repeat every   years  Mammogram status: Ordered  . Pt provided with contact info and advised to call to schedule appt.   Bone Density status: Completed 07/2021. Results reflect: Bone density results: OSTEOPOROSIS. Repeat every   years.  Lung Cancer Screening: (Low Dose CT Chest recommended if Age 30-80 years, 30 pack-year currently smoking OR have quit w/in 15years.) does not qualify.   Lung Cancer Screening Referral:   Additional Screening:  Hepatitis C Screening: does not qualify; Completed [id number]  Vision Screening: Recommended annual ophthalmology exams for early detection of glaucoma and other disorders of the eye. Is the patient up to date with their annual eye exam?  No  Who is the provider or what is the name of the office in which the patient attends annual eye exams?  If pt is not established with a provider, would they like to be referred to a provider to establish care? No .   Dental Screening: Recommended annual dental exams for proper oral hygiene  Community Resource Referral / Chronic Care Management: CRR required this visit?  No   CCM required this visit?  No      Plan:     I have personally reviewed and noted the following in the patient's chart:   Medical and social history Use of alcohol, tobacco or illicit drugs  Current medications and supplements including opioid prescriptions. Patient is not currently taking opioid prescriptions. Functional ability and status Nutritional status Physical  activity Advanced directives List of other physicians Hospitalizations, surgeries, and ER visits in previous 12 months Vitals Screenings to include cognitive, depression, and falls Referrals and appointments  In addition, I have reviewed and discussed with patient certain preventive protocols, quality metrics, and best practice recommendations. A written personalized care plan for preventive services as well as general preventive health recommendations were provided to patient.     Sharee Holster, NP   06/16/2022   Nurse Notes: this exam was performed by myself at this facility

## 2022-06-17 DIAGNOSIS — R635 Abnormal weight gain: Secondary | ICD-10-CM | POA: Insufficient documentation

## 2022-06-20 DIAGNOSIS — L249 Irritant contact dermatitis, unspecified cause: Secondary | ICD-10-CM | POA: Diagnosis not present

## 2022-06-20 DIAGNOSIS — L298 Other pruritus: Secondary | ICD-10-CM | POA: Diagnosis not present

## 2022-06-20 DIAGNOSIS — R21 Rash and other nonspecific skin eruption: Secondary | ICD-10-CM | POA: Diagnosis not present

## 2022-06-24 ENCOUNTER — Encounter: Payer: Self-pay | Admitting: Adult Health

## 2022-06-24 NOTE — Progress Notes (Unsigned)
Location:  [facility] Room Number: 101 D Place of Service:  SNF (31)   CODE STATUS: DNR  No Known Allergies  Chief Complaint  Patient presents with   Acute Visit    Diabetic concerns     HPI:    Past Medical History:  Diagnosis Date   CAD (coronary artery disease)    DM (diabetes mellitus)  with CKD    GFR 30   Glaucoma    HTN (hypertension)    Obesity    Osteoarthritis     Past Surgical History:  Procedure Laterality Date   CHOLECYSTECTOMY     CORONARY STENT PLACEMENT     x3   REFRACTIVE SURGERY     detached retina   TUBAL LIGATION     77 yrs old    Social History   Socioeconomic History   Marital status: Divorced    Spouse name: Not on file   Number of children: 2   Years of education: Not on file   Highest education level: Not on file  Occupational History   Occupation: retired  Tobacco Use   Smoking status: Every Day    Packs/day: 1    Types: Cigarettes   Smokeless tobacco: Not on file  Vaping Use   Vaping Use: Not on file  Substance and Sexual Activity   Alcohol use: Yes    Comment: rarely   Drug use: Never   Sexual activity: Not on file  Other Topics Concern   Not on file  Social History Narrative   Not on file   Social Determinants of Health   Financial Resource Strain: Not on file  Food Insecurity: Not on file  Transportation Needs: Not on file  Physical Activity: Not on file  Stress: Not on file  Social Connections: Not on file  Intimate Partner Violence: Not on file   Family History  Problem Relation Age of Onset   Stroke Father    Diabetes Sister    Hypertension Sister    Arthritis Other    Heart disease Other    Hypertension Other    Stroke Other    Kidney disease Other    Diabetes Other       VITAL SIGNS BP 136/79   Pulse 80   Temp 98 F (36.7 C)   Ht  (1.626 m)   Wt 189 lb (85.7 kg)   SpO2 98%   BMI 32.44 kg/m   Outpatient Encounter Medications as of 06/24/2022   Medication Sig   acetaminophen (TYLENOL) 650 MG CR tablet Take 650 mg by mouth 3 (three) times daily.   Amino Acids-Protein Hydrolys (FEEDING SUPPLEMENT, PRO-STAT SUGAR FREE 64,) LIQD Take 30 mLs by mouth 3 (three) times daily with meals.   aspirin 81 MG tablet Take 81 mg by mouth daily.   atorvastatin (LIPITOR) 20 MG tablet Take 20 mg by mouth daily.   calcium-vitamin D (OSCAL WITH D) 500-5 MG-MCG tablet Take 1 tablet by mouth daily.   camphor-menthol (SARNA) lotion Apply 1 Application topically 2 (two) times daily.   clobetasol cream (TEMOVATE) 0.05 % Apply 1 Application topically 2 (two) times a week. Apply to labia/vaginal area twice a week. Once A Day on Mon, Fri   dorzolamide-timolol (COSOPT) 22.3-6.8 MG/ML ophthalmic solution Place 1 drop into both eyes daily.  Wait at least 5 minutes between multiple drops in same eye.   ferrous sulfate 325 (65 FE) MG tablet Take 325 mg by mouth as directed. On  Monday and Thursday   furosemide (LASIX) 20 MG tablet Take 20 mg by mouth daily.   hydrocortisone 1 % ointment Apply 1 Application topically 3 (three) times daily.   Insulin Glargine (BASAGLAR KWIKPEN) 100 UNIT/ML Inject 25 Units into the skin at bedtime.   insulin lispro (HUMALOG KWIKPEN) 100 UNIT/ML KwikPen Inject 7 Units into the skin 3 (three) times daily. Inject 7 units subcutaneous with meals.   latanoprost (XALATAN) 0.005 % ophthalmic solution Place 1 drop into both eyes daily.   loperamide (IMODIUM A-D) 2 MG tablet Take 2 mg by mouth every 6 (six) hours as needed for diarrhea or loose stools.   loratadine (CLARITIN) 10 MG tablet Take 10 mg by mouth daily.   NON FORMULARY Diet:Regular   PRESCRIPTION MEDICATION Endit Skin Protectant Ointment; topical Special Instructions: Apply to vaginal/perineal area bid. Twice A Day   [DISCONTINUED] predniSONE (DELTASONE) 10 MG tablet Take 30 mg by mouth daily with breakfast.   No facility-administered encounter medications on file as of 06/24/2022.      SIGNIFICANT DIAGNOSTIC EXAMS       ASSESSMENT/ PLAN:     Synthia Innocent NP [facility]  Contact [phone] Monday through Friday 8am- 5pm  After hours call [phone]

## 2022-06-25 NOTE — Progress Notes (Signed)
This encounter was created in error - please disregard.

## 2022-07-07 ENCOUNTER — Encounter: Payer: Self-pay | Admitting: Adult Health

## 2022-07-07 ENCOUNTER — Non-Acute Institutional Stay (SKILLED_NURSING_FACILITY): Payer: Medicare Other | Admitting: Adult Health

## 2022-07-07 DIAGNOSIS — N1832 Chronic kidney disease, stage 3b: Secondary | ICD-10-CM | POA: Diagnosis not present

## 2022-07-07 DIAGNOSIS — E1122 Type 2 diabetes mellitus with diabetic chronic kidney disease: Secondary | ICD-10-CM

## 2022-07-07 DIAGNOSIS — Z794 Long term (current) use of insulin: Secondary | ICD-10-CM

## 2022-07-07 NOTE — Progress Notes (Unsigned)
Location:  [facility] Room Number: 101-D Place of Service:  SNF (31)   CODE STATUS: DNR  No Known Allergies  Chief Complaint  Patient presents with   Follow-up    Diabetic follow-up     HPI:  She has had elevated cbg readings. Most of her readings are over 200 -500. She is taking basaglar 25 units nightly and humalog 7 units with meals. There are no reports of her declining any medications. There are no reports of excessive hunger or thirst   Past Medical History:  Diagnosis Date   CAD (coronary artery disease)    DM (diabetes mellitus)  with CKD    GFR 30   Glaucoma    HTN (hypertension)    Obesity    Osteoarthritis     Past Surgical History:  Procedure Laterality Date   CHOLECYSTECTOMY     CORONARY STENT PLACEMENT     x3   REFRACTIVE SURGERY     detached retina   TUBAL LIGATION     77 yrs old    Social History   Socioeconomic History   Marital status: Divorced    Spouse name: Not on file   Number of children: 2   Years of education: Not on file   Highest education level: Not on file  Occupational History   Occupation: retired  Tobacco Use   Smoking status: Every Day    Packs/day: 1    Types: Cigarettes   Smokeless tobacco: Not on file  Vaping Use   Vaping Use: Not on file  Substance and Sexual Activity   Alcohol use: Yes    Comment: rarely   Drug use: Never   Sexual activity: Not on file  Other Topics Concern   Not on file  Social History Narrative   Not on file   Social Determinants of Health   Financial Resource Strain: Not on file  Food Insecurity: Not on file  Transportation Needs: Not on file  Physical Activity: Not on file  Stress: Not on file  Social Connections: Not on file  Intimate Partner Violence: Not on file   Family History  Problem Relation Age of Onset   Stroke Father    Diabetes Sister    Hypertension Sister    Arthritis Other    Heart disease Other    Hypertension Other    Stroke Other     Kidney disease Other    Diabetes Other       VITAL SIGNS BP 131/89   Pulse 78   Temp 98.4 F (36.9 C)   Resp 20   Ht 5\' 4"  (1.626 m)   Wt 189 lb (85.7 kg)   SpO2 98%   BMI 32.44 kg/m   Outpatient Encounter Medications as of 07/07/2022  Medication Sig   acetaminophen (TYLENOL) 650 MG CR tablet Take 650 mg by mouth 3 (three) times daily.   Amino Acids-Protein Hydrolys (FEEDING SUPPLEMENT, PRO-STAT SUGAR FREE 64,) LIQD Take 30 mLs by mouth 3 (three) times daily with meals.   aspirin 81 MG tablet Take 81 mg by mouth daily.   atorvastatin (LIPITOR) 20 MG tablet Take 20 mg by mouth daily.   calcium-vitamin D (OSCAL WITH D) 500-5 MG-MCG tablet Take 1 tablet by mouth daily.   camphor-menthol (SARNA) lotion Apply 1 Application topically 2 (two) times daily.   clobetasol cream (TEMOVATE) 0.05 % Apply 1 Application topically 2 (two) times a week. Apply to labia/vaginal area twice a week. Once A Day  on Mon, Fri   dorzolamide-timolol (COSOPT) 22.3-6.8 MG/ML ophthalmic solution Place 1 drop into both eyes daily.  Wait at least 5 minutes between multiple drops in same eye.   ferrous sulfate 325 (65 FE) MG tablet Take 325 mg by mouth as directed. On Monday and Thursday   furosemide (LASIX) 20 MG tablet Take 20 mg by mouth daily.   hydrocortisone 1 % ointment Apply 1 Application topically 3 (three) times daily.   Insulin Glargine (BASAGLAR KWIKPEN) 100 UNIT/ML Inject 25 Units into the skin at bedtime.   insulin lispro (HUMALOG KWIKPEN) 100 UNIT/ML KwikPen Inject 7 Units into the skin 3 (three) times daily. Inject 7 units subcutaneous with meals.   latanoprost (XALATAN) 0.005 % ophthalmic solution Place 1 drop into both eyes daily.   loperamide (IMODIUM A-D) 2 MG tablet Take 2 mg by mouth every 6 (six) hours as needed for diarrhea or loose stools.   loratadine (CLARITIN) 10 MG tablet Take 10 mg by mouth daily.   NON FORMULARY Diet:Regular   PRESCRIPTION MEDICATION Endit Skin Protectant  Ointment; topical Special Instructions: Apply to vaginal/perineal area bid. Twice A Day   No facility-administered encounter medications on file as of 07/07/2022.     SIGNIFICANT DIAGNOSTIC EXAMS  PREVIOUS   07-25-21: dexa: t score: -2.521  NO NEW EXAMS   PREVIOUS   07-08-21: hepatitis C nr; urine micro-albumin  54.5 09-26-21: hgb a1c 9.0   10-07-21: glucose 282; bun 47; creat 1.90; k+ 4.3; na++ 137; ca 8.8; gfr 27 11-06-21: glucose 104; bun 36; creat 1.77; k+ 4.4; na++ 137; ca 8.4; gfr 29 11-18-21: tsh 2.112  12-23-21: hgb a1c 10.0 01-09-22: wbc 10.0; hgb 10.2; hct 30.1; mcv 93.5 plt 209; glucose 256; bun 44; creat 1.74; k+ 4.4; na++ 135; ca 8.5; gfr 30; protein 6.2; albumin 3.0  04-14-22: hgb A1c 7.0; ACR 134; micro-albumin 140.6 04-28-22: tsh 0.393   05-01-22: wbc 7.5; hgb 9.5; hct 29.7; mcv 97.1 plt 242; glucose 126; bun 42; creat 1.68; k+ 4.4; na++ 139; ca 8.0; gfr 31; protein 5.5 albumin 2.8   NO NEW LABS   Review of Systems  Constitutional:  Negative for malaise/fatigue.  Respiratory:  Negative for cough and shortness of breath.   Cardiovascular:  Negative for chest pain, palpitations and leg swelling.  Gastrointestinal:  Negative for abdominal pain, constipation and heartburn.  Musculoskeletal:  Negative for back pain, joint pain and myalgias.  Skin: Negative.   Neurological:  Negative for dizziness.  Psychiatric/Behavioral:  The patient is not nervous/anxious.    Physical Exam Constitutional:      General: She is not in acute distress.    Appearance: She is well-developed. She is not diaphoretic.  Neck:     Thyroid: No thyromegaly.  Cardiovascular:     Rate and Rhythm: Normal rate and regular rhythm.     Pulses: Normal pulses.     Heart sounds: Normal heart sounds.  Pulmonary:     Effort: Pulmonary effort is normal. No respiratory distress.     Breath sounds: Normal breath sounds.  Abdominal:     General: Bowel sounds are normal. There is no distension.      Palpations: Abdomen is soft.     Tenderness: There is no abdominal tenderness.  Musculoskeletal:        General: Normal range of motion.     Cervical back: Neck supple.     Right lower leg: No edema.     Left lower leg: No edema.  Lymphadenopathy:  Cervical: No cervical adenopathy.  Skin:    General: Skin is warm and dry.  Neurological:     Mental Status: She is alert. Mental status is at baseline.  Psychiatric:        Mood and Affect: Mood normal.       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

## 2022-07-14 ENCOUNTER — Ambulatory Visit (INDEPENDENT_AMBULATORY_CARE_PROVIDER_SITE_OTHER): Payer: Medicare Other | Admitting: Internal Medicine

## 2022-07-14 ENCOUNTER — Other Ambulatory Visit: Payer: Self-pay

## 2022-07-14 VITALS — BP 164/66 | HR 61 | Temp 97.9°F | Resp 16 | Ht 63.0 in | Wt 187.2 lb

## 2022-07-14 DIAGNOSIS — J3089 Other allergic rhinitis: Secondary | ICD-10-CM

## 2022-07-14 DIAGNOSIS — J343 Hypertrophy of nasal turbinates: Secondary | ICD-10-CM | POA: Diagnosis not present

## 2022-07-14 DIAGNOSIS — L299 Pruritus, unspecified: Secondary | ICD-10-CM | POA: Diagnosis not present

## 2022-07-14 DIAGNOSIS — J3489 Other specified disorders of nose and nasal sinuses: Secondary | ICD-10-CM

## 2022-07-14 DIAGNOSIS — L503 Dermatographic urticaria: Secondary | ICD-10-CM

## 2022-07-14 MED ORDER — IPRATROPIUM BROMIDE 0.06 % NA SOLN: 1.0000 | Freq: Four times a day (QID) | NASAL | 11 refills | Status: DC | PRN

## 2022-07-14 MED ORDER — LORATADINE 10 MG PO TABS: 10.0000 mg | ORAL_TABLET | Freq: Every day | ORAL | 11 refills | Status: DC | PRN

## 2022-07-14 NOTE — Patient Instructions (Addendum)
Chronic Rhinitis  - Positive skin test 07/2022: none - Use nasal saline rinses spray before medicated sprays to clean out the nose as needed. - Use Ipratroprium 1 sprays up to four times daily as needed for runny nose. Aim upward and outward.  Be careful with this as it can cause nasal dryness if overused.   - Use Claritin 10 mg daily as needed for runny nose, sneezing, itchy watery eyes.    Pruritus (Itching), Sensitive Skin Dermatographism - Good skincare as discussed below.  - Do a daily soaking tub bath in warm water for 10-15 minutes.  - Use a gentle, unscented cleanser at the end of the bath (such as Dove unscented bar or baby wash, or Aveeno sensitive body wash). Then rinse, pat half-way dry, and apply a gentle, unscented moisturizer cream or ointment (Cerave, Cetaphil, Eucerin, Aveeno)  all over while still damp. Dry skin makes the itching and rash of eczema worse. The skin should be moisturized with a gentle, unscented moisturizer at least twice daily.  - Use only unscented liquid laundry detergent. - If persistent or with new rashes, please take pictures.  - Okay to use Claritin daily if needed to help with itching.

## 2022-07-14 NOTE — Progress Notes (Signed)
NEW PATIENT  Date of Service/Encounter:  07/14/22  Consult requested by: Sharee Holster, NP   Subjective:   Paige Wilkins (DOB: [birth year]) is a 77 y.o. female who presents to the clinic on 07/14/2022 with a chief complaint of Allergic Rhinitis  .    History obtained from: chart review and patient and son and daughter .   Rhinitis:  Started the past couple of years, not the best history due to dementia so mostly obtained from chart review and kids.  Symptoms include: nasal congestion, rhinorrhea, and post nasal drainage  Occurs year-round Potential triggers: not sure Treatments tried:  Claritin 10mg  PRN; last given 07/09/2022  Previous allergy testing: no History of sinus surgery: no Nonallergic triggers: none   Dermatitis/Pruritus: Has had issues with breaking out in rashes and having itching.  Denies any complaints recently.  No pictures.  No clear triggers. Has PRN hydrocortisone 1%    Past Medical History: Past Medical History:  Diagnosis Date   CAD (coronary artery disease)    DM (diabetes mellitus)  with CKD    GFR 30   Glaucoma    HTN (hypertension)    Obesity    Osteoarthritis     Past Surgical History: Past Surgical History:  Procedure Laterality Date   CHOLECYSTECTOMY     CORONARY STENT PLACEMENT     x3   REFRACTIVE SURGERY     detached retina   TUBAL LIGATION     77 yrs old    Family History: Family History  Problem Relation Age of Onset   Stroke Father    Diabetes Sister    Hypertension Sister    Arthritis Other    Heart disease Other    Hypertension Other    Stroke Other    Kidney disease Other    Diabetes Other     Social History:  Lives in a nursing home.   Medication List:  Allergies as of 07/14/2022   No Known Allergies      Medication List        Accurate as of Jul 14, 2022  2:42 PM. If you have any questions, ask your nurse or doctor.          acetaminophen 650 MG CR tablet Commonly known as:  TYLENOL Take 650 mg by mouth 3 (three) times daily.   aspirin 81 MG tablet Take 81 mg by mouth daily.   atorvastatin 20 MG tablet Commonly known as: LIPITOR Take 20 mg by mouth daily.   Basaglar KwikPen 100 UNIT/ML Inject 25 Units into the skin at bedtime.   calcium-vitamin D 500-5 MG-MCG tablet Commonly known as: OSCAL WITH D Take 1 tablet by mouth daily.   clobetasol cream 0.05 % Commonly known as: TEMOVATE Apply 1 Application topically 2 (two) times a week. Apply to labia/vaginal area twice a week. Once A Day on Mon, Fri   dorzolamide-timolol 2-0.5 % ophthalmic solution Commonly known as: COSOPT Place 1 drop into both eyes daily.  Wait at least 5 minutes between multiple drops in same eye.   feeding supplement (PRO-STAT SUGAR FREE 64) Liqd Take 30 mLs by mouth 3 (three) times daily with meals.   ferrous sulfate 325 (65 FE) MG tablet Take 325 mg by mouth as directed. On Monday and Thursday   furosemide 20 MG tablet Commonly known as: LASIX Take 20 mg by mouth daily.   HumaLOG KwikPen 100 UNIT/ML KwikPen Generic drug: insulin lispro Inject 7 Units into the skin 3 (three) times  daily. Inject 7 units subcutaneous with meals.   hydrocortisone 1 % ointment Apply 1 Application topically 3 (three) times daily.   ipratropium 0.06 % nasal spray Commonly known as: ATROVENT Place 1 spray into both nostrils 4 (four) times daily as needed for rhinitis. Started by: Birder Robson, MD   latanoprost 0.005 % ophthalmic solution Commonly known as: XALATAN Place 1 drop into both eyes daily.   loperamide 2 MG tablet Commonly known as: IMODIUM A-D Take 2 mg by mouth every 6 (six) hours as needed for diarrhea or loose stools.   loratadine 10 MG tablet Commonly known as: CLARITIN Take 1 tablet (10 mg total) by mouth daily as needed for itching or rhinitis. What changed:  when to take this reasons to take this Changed by: Birder Robson, MD   NON FORMULARY Diet:Regular    PRESCRIPTION MEDICATION Endit Skin Protectant Ointment; topical Special Instructions: Apply to vaginal/perineal area bid. Twice A Day   Sarna lotion Generic drug: camphor-menthol Apply 1 Application topically 2 (two) times daily.         REVIEW OF SYSTEMS: Pertinent positives and negatives discussed in HPI.   Objective:   Physical Exam: BP (!) 164/66   Pulse 61   Temp 97.9 F (36.6 C)   Resp 16   Ht 5\' 3"  (1.6 m)   Wt 187 lb 4 oz (84.9 kg) Comment: per nursing home notes. pt unable to get up out of wheelchair  SpO2 98%   BMI 33.17 kg/m  Body mass index is 33.17 kg/m. GEN: alert, well developed HEENT: clear conjunctiva, TM grey and translucent, nose with + inferior turbinate hypertrophy, pink nasal mucosa, slight clear rhinorrhea, no cobblestoning HEART: regular rate and rhythm, no murmur LUNGS: clear to auscultation bilaterally, no coughing, unlabored respiration ABDOMEN: soft, non distended  SKIN: no rashes or lesions, + dermatographism   Reviewed:  04/30/2022: in nursing home with linear abrasions/lacerations; holding Faxiga due to possible fungal infection.  Also doing a course of doxycycline for possible cellulitis.  Treated by Dermatology for scabies and was isolated at the time.   05/02/2021: seen in the ER after a fall with AMS and weakness.  Was also diagnosed with COVID and was isolating.  Has hx of dementia, CAD, DM, HTN, HLD. CT head was fine.  Had AKI on CKD.    11/21/2014: seen by Cardiology for CAD s/p PCI, HTN and HLD. EKG was fine at the time.  Stable overall. Plan to get MRI stress.    Skin Testing:  Skin prick testing was placed, which includes aeroallergens/foods, histamine control, and saline control.  Verbal consent was obtained prior to placing test.  Patient tolerated procedure well.  Allergy testing results were read and interpreted by myself, documented by clinical staff. Adequate positive and negative control.  Results discussed with  patient/family.  Airborne Adult Perc - 07/14/22 1411     Time Antigen Placed 1411    Allergen Manufacturer Waynette Buttery    Location Back    Number of Test 59    1. Control-Buffer 50% Glycerol Negative    2. Control-Histamine 1 mg/ml 2+    3. Albumin saline Negative    4. Bahia Negative    5. French Southern Territ[place] Negative    6. Johnson Negative    7. Kentucky Blue Negative    8. Meadow Fescue Negative    9. Perennial Rye Negative    10. Sweet Vernal Negative    11. Timothy Negative    12. Cocklebur Negative  13. Burweed Marshelder Negative    14. Ragweed, short Negative    15. Ragweed, Giant Negative    16. Plantain,  English Negative    17. Lamb's Quarters Negative    18. Sheep Sorrell Negative    19. Rough Pigweed Negative    20. Marsh Elder, Rough Negative    21. Mugwort, Common Negative    22. Ash mix Negative    23. Birch mix Negative    24. Beech American Negative    25. Box, Elder Negative    26. Cedar, red Negative    27. Cottonwood, Guinea-Bissau Negative    28. Elm mix Negative    29. Hickory Negative    30. Maple mix Negative    31. Oak, Guinea-Bissau mix Negative    32. Pecan Pollen Negative    33. Pine mix Negative    34. Sycamore Eastern Negative    35. Walnut, Black Pollen Negative    36. Alternaria alternata Negative    37. Cladosporium Herbarum Negative    38. Aspergillus mix Negative    39. Penicillium mix Negative    40. Bipolaris sorokiniana (Helminthosporium) Negative    41. Drechslera spicifera (Curvularia) Negative    42. Mucor plumbeus Negative    43. Fusarium moniliforme Negative    44. Aureobasidium pullulans (pullulara) Negative    45. Rhizopus oryzae Negative    46. Botrytis cinera Negative    47. Epicoccum nigrum Negative    48. Phoma betae Negative    49. Candida Albicans Negative    50. Trichophyton mentagrophytes Negative    51. Mite, D Farinae  5,000 AU/ml Negative    52. Mite, D Pteronyssinus  5,000 AU/ml Negative    53. Cat Hair 10,000 BAU/ml Negative     54.  Dog Epithelia Negative    55. Mixed Feathers Negative    56. Horse Epithelia Negative    57. Cockroach, German Negative    58. Mouse Negative    59. Tobacco Leaf Negative               Assessment:   1. Nasal turbinate hypertrophy   2. Pruritus   3. Rhinorrhea   4. Dermatographism   5. Other allergic rhinitis     Plan/Recommendations:  Chronic Rhinitis: - Due to turbinate hypertrophy and unresponsive to OTC meds, performed skin testing to identify aeroallergen triggers.   - Positive skin test 07/2022: none - Use nasal saline rinses spray before medicated sprays to clean out the nose as needed. - Use Ipratroprium 1 sprays up to four times daily as needed for runny nose. Aim upward and outward.  Be careful with this as it can cause nasal dryness if overused.   - Use Claritin 10 mg daily as needed for runny nose, sneezing, itchy watery eyes.   Pruritus (Itching), Sensitive Skin Dermatographism - Good skincare as discussed below.  - Do a daily soaking tub bath in warm water for 10-15 minutes.  - Use a gentle, unscented cleanser at the end of the bath (such as Dove unscented bar or baby wash, or Aveeno sensitive body wash). Then rinse, pat half-way dry, and apply a gentle, unscented moisturizer cream or ointment (Cerave, Cetaphil, Eucerin, Aveeno)  all over while still damp. Dry skin makes the itching and rash of eczema worse. The skin should be moisturized with a gentle, unscented moisturizer at least twice daily.  - Use only unscented liquid laundry detergent. - If persistent or with new rashes, please take pictures.  -  Okay to use Claritin 10mg  daily if needed to help with itching.     Return if symptoms worsen or fail to improve.  Alesia Morin, MD [facility]

## 2022-07-15 ENCOUNTER — Non-Acute Institutional Stay (SKILLED_NURSING_FACILITY): Payer: Medicare Other | Admitting: Internal Medicine

## 2022-07-15 ENCOUNTER — Encounter: Payer: Self-pay | Admitting: Internal Medicine

## 2022-07-15 DIAGNOSIS — Z794 Long term (current) use of insulin: Secondary | ICD-10-CM

## 2022-07-15 DIAGNOSIS — E1122 Type 2 diabetes mellitus with diabetic chronic kidney disease: Secondary | ICD-10-CM

## 2022-07-15 DIAGNOSIS — L309 Dermatitis, unspecified: Secondary | ICD-10-CM | POA: Diagnosis not present

## 2022-07-15 DIAGNOSIS — N1832 Chronic kidney disease, stage 3b: Secondary | ICD-10-CM | POA: Diagnosis not present

## 2022-07-15 DIAGNOSIS — F0390 Unspecified dementia without behavioral disturbance: Secondary | ICD-10-CM | POA: Diagnosis not present

## 2022-07-15 NOTE — Progress Notes (Signed)
NURSING HOME LOCATION:  [facility] ROOM NUMBER:  [id number]  CODE STATUS:  DNR  PCP:  Synthia Innocent NP  This is a nursing facility follow up visit of chronic medical diagnoses & to document compliance with Regulation 483.30 (c) in The Long Term Care Survey Manual Phase 2 which mandates caregiver visit ( visits can alternate among physician, PA or NP as per statutes) within 10 days of 30 days / 60 days/ 90 days post admission to SNF date    Interim medical record and care since last SNF visit was updated with review of diagnostic studies and change in clinical status since last visit were documented.  HPI: She is a permanent resident of this facility with medical diagnoses of CAD, diabetes with CKD, glaucoma, essential hypertension, dementia and osteoarthritis. Significant surgeries and procedures include coronary artery stenting and cholecystectomy. She is a former pack per day smoker.  Labs are current and reveal progression of CKD with current creatinine 1.8 & GFR 28 indicating stage IV CKD.  Dating back to July 2023 GFR has ranged from a low of 27 up to a high of 31 indicating relative stability.  Normochromic, normocytic anemia is essentially stable serially as well.  Current H/H are 9.5/29.7. TSH was low normal at 0.393 on 04/28/2022.  In February diabetic control was excellent as manifested by an A1c of 7%.  It had previously been 10% indicating uncontrolled diabetes in October 2023.  She was seen by an Allergist 5/6; diagnoses included chronic rhinitis and sensitive skin with pruritus and dermatographia. Soaking in tub for 10-15 minutes daily and employment of an unscented cleanser following the bath were recommended.  Moisturizing agent was also recommended.  Claritin was recommended for the itching.  Review of systems: Dementia invalidated responses.  She denied any active symptoms with special reference as to any dermatitis.  She had no memory of having seen the  Allergist yesterday.  She confabulated and responded with answers such as "not yet" or "I probably do , but not now" and "I do not know."  She has no clue as to her glucose recordings.  She is unaware of her peripheral edema..  Constitutional: No fever, significant weight change, fatigue  Eyes: No redness, discharge, pain, vision change ENT/mouth: No nasal congestion,  purulent discharge, earache, change in hearing, sore throat  Cardiovascular: No chest pain, palpitations, paroxysmal nocturnal dyspnea, claudication, edema  Respiratory: No cough, sputum production, hemoptysis, DOE, significant snoring, apnea   Gastrointestinal: No heartburn, dysphagia, abdominal pain, nausea /vomiting, rectal bleeding, melena, change in bowels Genitourinary: No dysuria, hematuria, pyuria, incontinence, nocturia Musculoskeletal: No joint stiffness, joint swelling, weakness, pain Dermatologic: No rash, pruritus, change in appearance of skin Neurologic: No dizziness, headache, syncope, seizures, numbness, tingling Psychiatric: No significant anxiety, depression, insomnia, anorexia Endocrine: No change in hair/skin/nails, excessive thirst, excessive hunger, excessive urination  Hematologic/lymphatic: No significant bruising, lymphadenopathy, abnormal bleeding Allergy/immunology: No itchy/watery eyes, significant sneezing, urticaria, angioedema  Physical exam:  Pertinent or positive findings: As noted she is pleasantly confused , confabulating as noted above.  The eyebrows are asymmetrical in height with the right typically higher than the left.  She has completely edentulous.  She has a grade 1/2 systolic murmur.  1/2+ edema at the sock line is present.  Pedal pulses are not palpable.  General appearance: Adequately nourished; no acute distress, increased work of breathing is present.   Lymphatic: No lymphadenopathy about the head, neck, axilla. Eyes: No conjunctival inflammation or lid edema is  present. There is no  scleral icterus. Ears:  External ear exam shows no significant lesions or deformities.   Nose:  External nasal examination shows no deformity or inflammation. Nasal mucosa are pink and moist without lesions, exudates Oral exam:  Lips and gums are healthy appearing. There is no oropharyngeal erythema or exudate. Neck:  No thyromegaly, masses, tenderness noted.    Heart:  Normal rate and regular rhythm. S1 and S2 normal without gallop,  click, rub .  Lungs: Chest clear to auscultation without wheezes, rhonchi, rales, rubs. Abdomen: Bowel sounds are normal. Abdomen is soft and nontender with no organomegaly, hernias, masses. GU: Deferred  Extremities:  No cyanosis, clubbing  Neurologic exam :Balance, Rhomberg, finger to nose testing could not be completed due to clinical state Skin: Warm & dry w/o tenting.  See summary under each active problem in the Problem List with associated updated therapeutic plan

## 2022-07-15 NOTE — Assessment & Plan Note (Signed)
She can provide no history; review of systems is totally negative.  She confabulates.  She is unaware of her diabetic status and peripheral edema.

## 2022-07-15 NOTE — Assessment & Plan Note (Addendum)
Staff reports improvement in the inguinal area rash.  Patient denies pruritus. Allergy consultation note & recommendations reviewed.

## 2022-07-15 NOTE — Assessment & Plan Note (Signed)
Her stage IV CKD is essentially stable serially.  She is not a candidate for hemodialysis.  Her A1c's have improved.  Significant hyperglycemia or hypoglycemia not documented.

## 2022-07-15 NOTE — Patient Instructions (Signed)
See assessment and plan under each diagnosis in the problem list and acutely for this visit 

## 2022-08-12 ENCOUNTER — Non-Acute Institutional Stay (SKILLED_NURSING_FACILITY): Payer: Medicare Other | Admitting: Adult Health

## 2022-08-12 ENCOUNTER — Encounter: Payer: Self-pay | Admitting: Adult Health

## 2022-08-12 DIAGNOSIS — E785 Hyperlipidemia, unspecified: Secondary | ICD-10-CM | POA: Diagnosis not present

## 2022-08-12 DIAGNOSIS — N183 Chronic kidney disease, stage 3 unspecified: Secondary | ICD-10-CM

## 2022-08-12 DIAGNOSIS — E1169 Type 2 diabetes mellitus with other specified complication: Secondary | ICD-10-CM

## 2022-08-12 DIAGNOSIS — N1832 Chronic kidney disease, stage 3b: Secondary | ICD-10-CM

## 2022-08-12 DIAGNOSIS — Z794 Long term (current) use of insulin: Secondary | ICD-10-CM

## 2022-08-12 DIAGNOSIS — E1122 Type 2 diabetes mellitus with diabetic chronic kidney disease: Secondary | ICD-10-CM

## 2022-08-12 NOTE — Progress Notes (Signed)
Location:  [facility] Room Number: 101-D Place of Service:  SNF (31)   CODE STATUS: DNR  No Known Allergies  Chief Complaint  Patient presents with   Medical Management of Chronic Issues                      Controlled type 2 diabetes mellitus with stage 3 chronic kidney disease with long term current use of insulin:    Stage 3 chronic kidney disease due to type 2 diabetes mellitus: Hyperlipidemia associated with type 2 diabetes mellitus     HPI:  She is a 77 year old long term resident of this facility being seen for the management of her chronic illnesses:   Controlled type 2 diabetes mellitus with stage 3 chronic kidney disease with long term current use of insulin:    Stage 3 chronic kidney disease due to type 2 diabetes mellitus: Hyperlipidemia associated with type 2 diabetes mellitus. There are no reports of uncontrolled pain.   Past Medical History:  Diagnosis Date   CAD (coronary artery disease)    DM (diabetes mellitus)  with CKD    GFR 30   Glaucoma    HTN (hypertension)    Obesity    Osteoarthritis     Past Surgical History:  Procedure Laterality Date   CHOLECYSTECTOMY     CORONARY STENT PLACEMENT     x3   REFRACTIVE SURGERY     detached retina   TUBAL LIGATION     77 yrs old    Social History   Socioeconomic History   Marital status: Divorced    Spouse name: Not on file   Number of children: 2   Years of education: Not on file   Highest education level: Not on file  Occupational History   Occupation: retired  Tobacco Use   Smoking status: Every Day    Packs/day: 1    Types: Cigarettes   Smokeless tobacco: Not on file  Vaping Use   Vaping Use: Not on file  Substance and Sexual Activity   Alcohol use: Yes    Comment: rarely   Drug use: Never   Sexual activity: Not on file  Other Topics Concern   Not on file  Social History Narrative   Not on file   Social Determinants of Health   Financial Resource Strain: Not  on file  Food Insecurity: Not on file  Transportation Needs: Not on file  Physical Activity: Not on file  Stress: Not on file  Social Connections: Not on file  Intimate Partner Violence: Not on file   Family History  Problem Relation Age of Onset   Stroke Father    Diabetes Sister    Hypertension Sister    Arthritis Other    Heart disease Other    Hypertension Other    Stroke Other    Kidney disease Other    Diabetes Other       VITAL SIGNS BP 126/60   Pulse 70   Temp 98 F (36.7 C)   Resp 20   Ht 5\' 4"  (1.626 m)   Wt 187 lb 6.4 oz (85 kg)   SpO2 95%   BMI 32.17 kg/m   Outpatient Encounter Medications as of 08/12/2022  Medication Sig   acetaminophen (TYLENOL) 650 MG CR tablet Take 650 mg by mouth 3 (three) times daily.   Amino Acids-Protein Hydrolys (FEEDING SUPPLEMENT, PRO-STAT SUGAR FREE 64,) LIQD Take 30 mLs by mouth 3 (  three) times daily with meals.   aspirin 81 MG tablet Take 81 mg by mouth daily.   atorvastatin (LIPITOR) 20 MG tablet Take 20 mg by mouth daily.   calcium-vitamin D (OSCAL WITH D) 500-5 MG-MCG tablet Take 1 tablet by mouth daily.   clobetasol cream (TEMOVATE) 0.05 % Apply 1 Application topically 2 (two) times a week. Apply to labia/vaginal area twice a week. Once A Day on Mon, Fri   dapagliflozin propanediol (FARXIGA) 5 MG TABS tablet Take 5 mg by mouth daily.   dorzolamide-timolol (COSOPT) 22.3-6.8 MG/ML ophthalmic solution Place 1 drop into both eyes daily.  Wait at least 5 minutes between multiple drops in same eye.   ferrous sulfate 325 (65 FE) MG tablet Take 325 mg by mouth as directed. On Monday and Thursday   furosemide (LASIX) 20 MG tablet Take 20 mg by mouth daily.   Insulin Glargine (BASAGLAR KWIKPEN) 100 UNIT/ML Inject 25 Units into the skin at bedtime.   insulin lispro (HUMALOG KWIKPEN) 100 UNIT/ML KwikPen Inject 7 Units into the skin 3 (three) times daily. Inject 7 units subcutaneous with meals.   latanoprost (XALATAN) 0.005 %  ophthalmic solution Place 1 drop into both eyes daily.   loperamide (IMODIUM A-D) 2 MG tablet Take 2 mg by mouth every 6 (six) hours as needed for diarrhea or loose stools.   loratadine (CLARITIN) 10 MG tablet Take 1 tablet (10 mg total) by mouth daily as needed for itching or rhinitis.   NON FORMULARY Diet:Regular   PRESCRIPTION MEDICATION Endit Skin Protectant Ointment; topical Special Instructions: Apply to vaginal/perineal area bid. Twice A Day   [DISCONTINUED] camphor-menthol (SARNA) lotion Apply 1 Application topically 2 (two) times daily.   [DISCONTINUED] hydrocortisone 1 % ointment Apply 1 Application topically 3 (three) times daily.   [DISCONTINUED] ipratropium (ATROVENT) 0.06 % nasal spray Place 1 spray into both nostrils 4 (four) times daily as needed for rhinitis.   No facility-administered encounter medications on file as of 08/12/2022.     SIGNIFICANT DIAGNOSTIC EXAMS  PREVIOUS   07-25-21: dexa: t score: -2.521  NO NEW EXAMS   PREVIOUS   09-26-21: hgb a1c 9.0   10-07-21: glucose 282; bun 47; creat 1.90; k+ 4.3; na++ 137; ca 8.8; gfr 27 11-06-21: glucose 104; bun 36; creat 1.77; k+ 4.4; na++ 137; ca 8.4; gfr 29 11-18-21: tsh 2.112  12-23-21: hgb a1c 10.0 01-09-22: wbc 10.0; hgb 10.2; hct 30.1; mcv 93.5 plt 209; glucose 256; bun 44; creat 1.74; k+ 4.4; na++ 135; ca 8.5; gfr 30; protein 6.2; albumin 3.0  04-14-22: hgb A1c 7.0; ACR 134; micro-albumin 140.6 04-28-22: tsh 0.393   05-01-22: wbc 7.5; hgb 9.5; hct 29.7; mcv 97.1 plt 242; glucose 126; bun 42; creat 1.68; k+ 4.4; na++ 139; ca 8.0; gfr 31; protein 5.5 albumin 2.8   TODAY  06-05-22: glucose 113; bun 59; creat 1.82; k+ 4.0; na++ 138; ca 8.2; gfr 28    Review of Systems  Constitutional:  Negative for malaise/fatigue.  Respiratory:  Negative for cough and shortness of breath.   Cardiovascular:  Negative for chest pain, palpitations and leg swelling.  Gastrointestinal:  Negative for abdominal pain, constipation and  heartburn.  Musculoskeletal:  Negative for back pain, joint pain and myalgias.  Skin: Negative.   Neurological:  Negative for dizziness.  Psychiatric/Behavioral:  The patient is not nervous/anxious.    Physical Exam Constitutional:      General: She is not in acute distress.    Appearance: She is well-developed. She  is obese. She is not diaphoretic.  Neck:     Thyroid: No thyromegaly.  Cardiovascular:     Rate and Rhythm: Normal rate and regular rhythm.     Pulses: Normal pulses.     Heart sounds: Normal heart sounds.  Pulmonary:     Effort: Pulmonary effort is normal. No respiratory distress.     Breath sounds: Normal breath sounds.  Abdominal:     General: Bowel sounds are normal. There is no distension.     Palpations: Abdomen is soft.     Tenderness: There is no abdominal tenderness.  Musculoskeletal:        General: Normal range of motion.     Cervical back: Neck supple.     Right lower leg: No edema.     Left lower leg: No edema.  Lymphadenopathy:     Cervical: No cervical adenopathy.  Skin:    General: Skin is warm and dry.  Neurological:     Mental Status: She is alert. Mental status is at baseline.  Psychiatric:        Mood and Affect: Mood normal.      ASSESSMENT/ PLAN:  TODAY  Controlled type 2 diabetes mellitus with stage 3 chronic kidney disease with long term current use of insulin: hgb A1c 7.0; will continue farxiga 5 mg daily; basaglar 25 units nightly; novolog 7 units with meals.   2. Stage 3 chronic kidney disease due to type 2 diabetes mellitus: bun 59; creat 1.82; gfr 28 will continue farxiga 5 mg daily   3. Hyperlipidemia associated with type 2 diabetes mellitus: ldl 50 will continue lipitor 20 mg daily     PREVIOUS   4. Anemia due to stage 3b chronic kidney disease: hgb 8.6; will continue iron twice weekly   5. Increased intraocular pressure bilateral: will continue cosopt to both eyes and xalatan both eyes   6. Dementia without  behavioral disturbance: weight is 187 pounds will monitor   7. Protein calorie malnutrition: protein 5.5 albumin 2.8; will continue supplements and will add prostat 30 mL with meals.   8. Chronic depression: is off remeron due to history of falls  9.  hypertension associated with stage 3a chronic kidney disease due to type 2 diabetes mellitus: b/p 126/60    10. Post menopausal osteopenia: t score -2.561 will monitor    Synthia Innocent NP [facility]  call [phone]

## 2022-08-13 DIAGNOSIS — I1310 Hypertensive heart and chronic kidney disease without heart failure, with stage 1 through stage 4 chronic kidney disease, or unspecified chronic kidney disease: Secondary | ICD-10-CM | POA: Diagnosis not present

## 2022-08-13 DIAGNOSIS — F0280 Dementia in other diseases classified elsewhere without behavioral disturbance: Secondary | ICD-10-CM | POA: Diagnosis not present

## 2022-08-13 DIAGNOSIS — G9341 Metabolic encephalopathy: Secondary | ICD-10-CM | POA: Diagnosis not present

## 2022-08-13 DIAGNOSIS — R488 Other symbolic dysfunctions: Secondary | ICD-10-CM | POA: Diagnosis not present

## 2022-08-14 DIAGNOSIS — G9341 Metabolic encephalopathy: Secondary | ICD-10-CM | POA: Diagnosis not present

## 2022-08-14 DIAGNOSIS — F0280 Dementia in other diseases classified elsewhere without behavioral disturbance: Secondary | ICD-10-CM | POA: Diagnosis not present

## 2022-08-14 DIAGNOSIS — R488 Other symbolic dysfunctions: Secondary | ICD-10-CM | POA: Diagnosis not present

## 2022-08-14 DIAGNOSIS — I1310 Hypertensive heart and chronic kidney disease without heart failure, with stage 1 through stage 4 chronic kidney disease, or unspecified chronic kidney disease: Secondary | ICD-10-CM | POA: Diagnosis not present

## 2022-08-15 ENCOUNTER — Other Ambulatory Visit (HOSPITAL_COMMUNITY)
Admission: RE | Admit: 2022-08-15 | Discharge: 2022-08-15 | Disposition: A | Discharge status: Home / Self Care | Payer: Medicare Other | Source: Skilled Nursing Facility | Attending: Adult Health | Admitting: Adult Health

## 2022-08-15 DIAGNOSIS — E43 Unspecified severe protein-calorie malnutrition: Secondary | ICD-10-CM | POA: Insufficient documentation

## 2022-08-15 DIAGNOSIS — G9341 Metabolic encephalopathy: Secondary | ICD-10-CM | POA: Diagnosis not present

## 2022-08-15 DIAGNOSIS — I1310 Hypertensive heart and chronic kidney disease without heart failure, with stage 1 through stage 4 chronic kidney disease, or unspecified chronic kidney disease: Secondary | ICD-10-CM | POA: Diagnosis not present

## 2022-08-15 DIAGNOSIS — F0280 Dementia in other diseases classified elsewhere without behavioral disturbance: Secondary | ICD-10-CM | POA: Diagnosis not present

## 2022-08-15 DIAGNOSIS — R488 Other symbolic dysfunctions: Secondary | ICD-10-CM | POA: Diagnosis not present

## 2022-08-15 LAB — ALBUMIN: Albumin: 3.1 g/dL — ABNORMAL LOW (ref 3.5–5.0)

## 2022-08-17 DIAGNOSIS — G9341 Metabolic encephalopathy: Secondary | ICD-10-CM | POA: Diagnosis not present

## 2022-08-17 DIAGNOSIS — I1310 Hypertensive heart and chronic kidney disease without heart failure, with stage 1 through stage 4 chronic kidney disease, or unspecified chronic kidney disease: Secondary | ICD-10-CM | POA: Diagnosis not present

## 2022-08-17 DIAGNOSIS — R488 Other symbolic dysfunctions: Secondary | ICD-10-CM | POA: Diagnosis not present

## 2022-08-17 DIAGNOSIS — F0280 Dementia in other diseases classified elsewhere without behavioral disturbance: Secondary | ICD-10-CM | POA: Diagnosis not present

## 2022-08-18 DIAGNOSIS — G9341 Metabolic encephalopathy: Secondary | ICD-10-CM | POA: Diagnosis not present

## 2022-08-18 DIAGNOSIS — I1310 Hypertensive heart and chronic kidney disease without heart failure, with stage 1 through stage 4 chronic kidney disease, or unspecified chronic kidney disease: Secondary | ICD-10-CM | POA: Diagnosis not present

## 2022-08-18 DIAGNOSIS — F0280 Dementia in other diseases classified elsewhere without behavioral disturbance: Secondary | ICD-10-CM | POA: Diagnosis not present

## 2022-08-18 DIAGNOSIS — R488 Other symbolic dysfunctions: Secondary | ICD-10-CM | POA: Diagnosis not present

## 2022-08-19 DIAGNOSIS — F0280 Dementia in other diseases classified elsewhere without behavioral disturbance: Secondary | ICD-10-CM | POA: Diagnosis not present

## 2022-08-19 DIAGNOSIS — I1310 Hypertensive heart and chronic kidney disease without heart failure, with stage 1 through stage 4 chronic kidney disease, or unspecified chronic kidney disease: Secondary | ICD-10-CM | POA: Diagnosis not present

## 2022-08-19 DIAGNOSIS — R488 Other symbolic dysfunctions: Secondary | ICD-10-CM | POA: Diagnosis not present

## 2022-08-19 DIAGNOSIS — G9341 Metabolic encephalopathy: Secondary | ICD-10-CM | POA: Diagnosis not present

## 2022-08-20 DIAGNOSIS — I1310 Hypertensive heart and chronic kidney disease without heart failure, with stage 1 through stage 4 chronic kidney disease, or unspecified chronic kidney disease: Secondary | ICD-10-CM | POA: Diagnosis not present

## 2022-08-20 DIAGNOSIS — R488 Other symbolic dysfunctions: Secondary | ICD-10-CM | POA: Diagnosis not present

## 2022-08-20 DIAGNOSIS — G9341 Metabolic encephalopathy: Secondary | ICD-10-CM | POA: Diagnosis not present

## 2022-08-20 DIAGNOSIS — F0280 Dementia in other diseases classified elsewhere without behavioral disturbance: Secondary | ICD-10-CM | POA: Diagnosis not present

## 2022-08-21 DIAGNOSIS — R488 Other symbolic dysfunctions: Secondary | ICD-10-CM | POA: Diagnosis not present

## 2022-08-21 DIAGNOSIS — G9341 Metabolic encephalopathy: Secondary | ICD-10-CM | POA: Diagnosis not present

## 2022-08-21 DIAGNOSIS — I1310 Hypertensive heart and chronic kidney disease without heart failure, with stage 1 through stage 4 chronic kidney disease, or unspecified chronic kidney disease: Secondary | ICD-10-CM | POA: Diagnosis not present

## 2022-08-21 DIAGNOSIS — F0280 Dementia in other diseases classified elsewhere without behavioral disturbance: Secondary | ICD-10-CM | POA: Diagnosis not present

## 2022-08-23 DIAGNOSIS — G9341 Metabolic encephalopathy: Secondary | ICD-10-CM | POA: Diagnosis not present

## 2022-08-23 DIAGNOSIS — R488 Other symbolic dysfunctions: Secondary | ICD-10-CM | POA: Diagnosis not present

## 2022-08-23 DIAGNOSIS — F0280 Dementia in other diseases classified elsewhere without behavioral disturbance: Secondary | ICD-10-CM | POA: Diagnosis not present

## 2022-08-23 DIAGNOSIS — I1310 Hypertensive heart and chronic kidney disease without heart failure, with stage 1 through stage 4 chronic kidney disease, or unspecified chronic kidney disease: Secondary | ICD-10-CM | POA: Diagnosis not present

## 2022-08-25 DIAGNOSIS — I1310 Hypertensive heart and chronic kidney disease without heart failure, with stage 1 through stage 4 chronic kidney disease, or unspecified chronic kidney disease: Secondary | ICD-10-CM | POA: Diagnosis not present

## 2022-08-25 DIAGNOSIS — F0280 Dementia in other diseases classified elsewhere without behavioral disturbance: Secondary | ICD-10-CM | POA: Diagnosis not present

## 2022-08-25 DIAGNOSIS — G9341 Metabolic encephalopathy: Secondary | ICD-10-CM | POA: Diagnosis not present

## 2022-08-25 DIAGNOSIS — R488 Other symbolic dysfunctions: Secondary | ICD-10-CM | POA: Diagnosis not present

## 2022-08-26 DIAGNOSIS — G9341 Metabolic encephalopathy: Secondary | ICD-10-CM | POA: Diagnosis not present

## 2022-08-26 DIAGNOSIS — R488 Other symbolic dysfunctions: Secondary | ICD-10-CM | POA: Diagnosis not present

## 2022-08-26 DIAGNOSIS — F0280 Dementia in other diseases classified elsewhere without behavioral disturbance: Secondary | ICD-10-CM | POA: Diagnosis not present

## 2022-08-26 DIAGNOSIS — I1310 Hypertensive heart and chronic kidney disease without heart failure, with stage 1 through stage 4 chronic kidney disease, or unspecified chronic kidney disease: Secondary | ICD-10-CM | POA: Diagnosis not present

## 2022-08-27 DIAGNOSIS — I1310 Hypertensive heart and chronic kidney disease without heart failure, with stage 1 through stage 4 chronic kidney disease, or unspecified chronic kidney disease: Secondary | ICD-10-CM | POA: Diagnosis not present

## 2022-08-27 DIAGNOSIS — F0280 Dementia in other diseases classified elsewhere without behavioral disturbance: Secondary | ICD-10-CM | POA: Diagnosis not present

## 2022-08-27 DIAGNOSIS — G9341 Metabolic encephalopathy: Secondary | ICD-10-CM | POA: Diagnosis not present

## 2022-08-27 DIAGNOSIS — R488 Other symbolic dysfunctions: Secondary | ICD-10-CM | POA: Diagnosis not present

## 2022-09-09 ENCOUNTER — Non-Acute Institutional Stay (SKILLED_NURSING_FACILITY): Payer: Medicare Other | Admitting: Adult Health

## 2022-09-09 ENCOUNTER — Encounter: Payer: Self-pay | Admitting: Adult Health

## 2022-09-09 DIAGNOSIS — F0390 Unspecified dementia without behavioral disturbance: Secondary | ICD-10-CM | POA: Diagnosis not present

## 2022-09-09 DIAGNOSIS — D631 Anemia in chronic kidney disease: Secondary | ICD-10-CM | POA: Diagnosis not present

## 2022-09-09 DIAGNOSIS — H40053 Ocular hypertension, bilateral: Secondary | ICD-10-CM

## 2022-09-09 DIAGNOSIS — N1832 Chronic kidney disease, stage 3b: Secondary | ICD-10-CM | POA: Diagnosis not present

## 2022-09-09 NOTE — Progress Notes (Signed)
Location:  [facility] Room Number: 47 D Place of Service:  SNF (31)   CODE STATUS: DNR  No Known Allergies  Chief Complaint  Patient presents with   Medical Management of Chronic Issues                        Anemia due to stage 3 chronic kidney disease:  Increased intraocular pressure bilateral:  Dementia without behavioral disturbance         HPI:  She is a 77 year old long term resident of this facility being seen for the management of her chronic illnesses: Anemia due to stage 3 chronic kidney disease:  Increased intraocular pressure bilateral:  Dementia without behavioral disturbance. There are no reports of uncontrolled pain. Her weight is stable. She continues to get out of bed daily.   Past Medical History:  Diagnosis Date   CAD (coronary artery disease)    DM (diabetes mellitus)  with CKD    GFR 30   Glaucoma    HTN (hypertension)    Obesity    Osteoarthritis     Past Surgical History:  Procedure Laterality Date   CHOLECYSTECTOMY     CORONARY STENT PLACEMENT     x3   REFRACTIVE SURGERY     detached retina   TUBAL LIGATION     77 yrs old    Social History   Socioeconomic History   Marital status: Divorced    Spouse name: Not on file   Number of children: 2   Years of education: Not on file   Highest education level: Not on file  Occupational History   Occupation: retired  Tobacco Use   Smoking status: Every Day    Packs/day: 1    Types: Cigarettes   Smokeless tobacco: Not on file  Vaping Use   Vaping Use: Not on file  Substance and Sexual Activity   Alcohol use: Yes    Comment: rarely   Drug use: Never   Sexual activity: Not on file  Other Topics Concern   Not on file  Social History Narrative   Not on file   Social Determinants of Health   Financial Resource Strain: Not on file  Food Insecurity: Not on file  Transportation Needs: Not on file  Physical Activity: Not on file  Stress: Not on file  Social  Connections: Not on file  Intimate Partner Violence: Not on file   Family History  Problem Relation Age of Onset   Stroke Father    Diabetes Sister    Hypertension Sister    Arthritis Other    Heart disease Other    Hypertension Other    Stroke Other    Kidney disease Other    Diabetes Other       VITAL SIGNS BP (!) 166/58   Pulse 61   Temp 98.4 F (36.9 C)   Resp 18   Ht 5\' 4"  (1.626 m)   Wt 198 lb (89.8 kg)   SpO2 100%   BMI 33.99 kg/m   Outpatient Encounter Medications as of 09/09/2022  Medication Sig   acetaminophen (TYLENOL) 650 MG CR tablet Take 650 mg by mouth 3 (three) times daily.   Amino Acids-Protein Hydrolys (FEEDING SUPPLEMENT, PRO-STAT SUGAR FREE 64,) LIQD Take 30 mLs by mouth 2 (two) times daily.   aspirin 81 MG tablet Take 81 mg by mouth daily.   atorvastatin (LIPITOR) 20 MG tablet Take 20 mg by  mouth daily.   calcium-vitamin D (OSCAL WITH D) 500-5 MG-MCG tablet Take 1 tablet by mouth daily.   clobetasol cream (TEMOVATE) 0.05 % Apply 1 Application topically 2 (two) times a week. Apply to labia/vaginal area twice a week. Once A Day on Mon, Fri   dapagliflozin propanediol (FARXIGA) 5 MG TABS tablet Take 5 mg by mouth daily.   dorzolamide-timolol (COSOPT) 22.3-6.8 MG/ML ophthalmic solution Place 1 drop into both eyes daily.  Wait at least 5 minutes between multiple drops in same eye.   ferrous sulfate 325 (65 FE) MG tablet Take 325 mg by mouth as directed. On Monday and Thursday   furosemide (LASIX) 20 MG tablet Take 20 mg by mouth daily.   Insulin Glargine (BASAGLAR KWIKPEN) 100 UNIT/ML Inject 25 Units into the skin at bedtime.   insulin lispro (HUMALOG KWIKPEN) 100 UNIT/ML KwikPen Inject 7 Units into the skin 3 (three) times daily. Inject 7 units subcutaneous with meals.   latanoprost (XALATAN) 0.005 % ophthalmic solution Place 1 drop into both eyes daily.   loratadine (CLARITIN) 10 MG tablet Take 1 tablet (10 mg total) by mouth daily as needed for itching  or rhinitis.   NON FORMULARY Diet:Regular   PRESCRIPTION MEDICATION Endit Skin Protectant Ointment; topical Special Instructions: Apply to vaginal/perineal area bid. Twice A Day   [DISCONTINUED] loperamide (IMODIUM A-D) 2 MG tablet Take 2 mg by mouth every 6 (six) hours as needed for diarrhea or loose stools.   No facility-administered encounter medications on file as of 09/09/2022.     SIGNIFICANT DIAGNOSTIC EXAMS  PREVIOUS   07-25-21: dexa: t score: -2.521  NO NEW EXAMS   PREVIOUS   09-26-21: hgb a1c 9.0   10-07-21: glucose 282; bun 47; creat 1.90; k+ 4.3; na++ 137; ca 8.8; gfr 27 11-06-21: glucose 104; bun 36; creat 1.77; k+ 4.4; na++ 137; ca 8.4; gfr 29 11-18-21: tsh 2.112  12-23-21: hgb a1c 10.0 01-09-22: wbc 10.0; hgb 10.2; hct 30.1; mcv 93.5 plt 209; glucose 256; bun 44; creat 1.74; k+ 4.4; na++ 135; ca 8.5; gfr 30; protein 6.2; albumin 3.0  04-14-22: hgb A1c 7.0; ACR 134; micro-albumin 140.6 04-28-22: tsh 0.393   05-01-22: wbc 7.5; hgb 9.5; hct 29.7; mcv 97.1 plt 242; glucose 126; bun 42; creat 1.68; k+ 4.4; na++ 139; ca 8.0; gfr 31; protein 5.5 albumin 2.8   TODAY  06-05-22: glucose 113; bun 59; creat 1.82; k+ 4.0; na++ 138; ca 8.2; gfr 28 08-14-22: albumin 3.1     Review of Systems  Constitutional:  Negative for malaise/fatigue.  Respiratory:  Negative for cough and shortness of breath.   Cardiovascular:  Negative for chest pain, palpitations and leg swelling.  Gastrointestinal:  Negative for abdominal pain, constipation and heartburn.  Musculoskeletal:  Negative for back pain, joint pain and myalgias.  Skin: Negative.   Neurological:  Negative for dizziness.  Psychiatric/Behavioral:  The patient is not nervous/anxious.    Physical Exam Constitutional:      General: She is not in acute distress.    Appearance: She is well-developed. She is obese. She is not diaphoretic.  Neck:     Thyroid: No thyromegaly.  Cardiovascular:     Rate and Rhythm: Normal rate and regular  rhythm.     Pulses: Normal pulses.     Heart sounds: Normal heart sounds.  Pulmonary:     Effort: Pulmonary effort is normal. No respiratory distress.     Breath sounds: Normal breath sounds.  Abdominal:     General:  Bowel sounds are normal. There is no distension.     Palpations: Abdomen is soft.     Tenderness: There is no abdominal tenderness.  Musculoskeletal:        General: Normal range of motion.     Cervical back: Neck supple.     Right lower leg: No edema.     Left lower leg: No edema.  Lymphadenopathy:     Cervical: No cervical adenopathy.  Skin:    General: Skin is warm and dry.  Neurological:     Mental Status: She is alert. Mental status is at baseline.  Psychiatric:        Mood and Affect: Mood normal.     ASSESSMENT/ PLAN:  TODAY  Anemia due to stage 3 chronic kidney disease: hgb 8.6; will continue iron twice weekly   2. Increased intraocular pressure bilateral: will continue cosopt and xalatan to both eyes  3. Dementia without behavioral disturbance: weight is 198 pounds.   PREVIOUS   4. Protein calorie malnutrition: protein 5.5 albumin 2.8; will continue supplements and will add prostat 30 mL with meals.   5. Chronic depression: is off remeron due to history of falls  6.  hypertension associated with stage 3a chronic kidney disease due to type 2 diabetes mellitus: b/p 138/77   7. Post menopausal osteopenia: t score -2.561 will monitor   8. Controlled type 2 diabetes mellitus with stage 3 chronic kidney disease with long term current use of insulin: hgb A1c 7.0; will continue farxiga 5 mg daily; basaglar 25 units nightly; novolog 7 units with meals.   9. Stage 3 chronic kidney disease due to type 2 diabetes mellitus: bun 59; creat 1.82; gfr 28 will continue farxiga 5 mg daily   10. Hyperlipidemia associated with type 2 diabetes mellitus: ldl 50 will continue lipitor 20 mg daily    Will check hgb A1c lipids    Synthia Innocent NP [facility]  Medicine  call [phone]

## 2022-09-11 ENCOUNTER — Other Ambulatory Visit (HOSPITAL_COMMUNITY)
Admission: RE | Admit: 2022-09-11 | Discharge: 2022-09-11 | Disposition: A | Discharge status: Home / Self Care | Payer: Medicare Other | Source: Skilled Nursing Facility | Attending: Adult Health | Admitting: Adult Health

## 2022-09-11 DIAGNOSIS — E119 Type 2 diabetes mellitus without complications: Secondary | ICD-10-CM | POA: Insufficient documentation

## 2022-09-11 LAB — LIPID PANEL
Cholesterol: 124 mg/dL (ref 0–200)
HDL: 51 mg/dL (ref >40)
LDL Cholesterol: 56 mg/dL (ref 0–99)
Total CHOL/HDL Ratio: 2.4 RATIO
Triglycerides: 85 mg/dL (ref <150)
VLDL: 17 mg/dL (ref 0–40)

## 2022-09-11 LAB — HEMOGLOBIN A1C
Hgb A1c MFr Bld: 8.8 % — ABNORMAL HIGH (ref 4.8–5.6)
Mean Plasma Glucose: 205.86 mg/dL

## 2022-09-15 ENCOUNTER — Non-Acute Institutional Stay (SKILLED_NURSING_FACILITY): Payer: Medicare Other | Admitting: Adult Health

## 2022-09-15 ENCOUNTER — Encounter: Payer: Self-pay | Admitting: Adult Health

## 2022-09-15 DIAGNOSIS — Z794 Long term (current) use of insulin: Secondary | ICD-10-CM | POA: Diagnosis not present

## 2022-09-15 DIAGNOSIS — N1832 Chronic kidney disease, stage 3b: Secondary | ICD-10-CM | POA: Diagnosis not present

## 2022-09-15 DIAGNOSIS — E1122 Type 2 diabetes mellitus with diabetic chronic kidney disease: Secondary | ICD-10-CM | POA: Diagnosis not present

## 2022-09-15 NOTE — Progress Notes (Unsigned)
Location:  [facility] Room Number: 101 Place of Service:  SNF (31)   CODE STATUS: dnr  No Known Allergies  Chief Complaint  Patient presents with   Acute Visit    Diabetes     HPI:  Her hgb A1c is 8.8  she is presently taking basaglar 25 units nightly; farxiga 5 mg daily; humalog 7 units with meals.  Her weight is 198 pounds. Her appetite is good. There are no reports of hypoglycemia.   Past Medical History:  Diagnosis Date   CAD (coronary artery disease)    DM (diabetes mellitus)  with CKD    GFR 30   Glaucoma    HTN (hypertension)    Obesity    Osteoarthritis     Past Surgical History:  Procedure Laterality Date   CHOLECYSTECTOMY     CORONARY STENT PLACEMENT     x3   REFRACTIVE SURGERY     detached retina   TUBAL LIGATION     77 yrs old    Social History   Socioeconomic History   Marital status: Divorced    Spouse name: Not on file   Number of children: 2   Years of education: Not on file   Highest education level: Not on file  Occupational History   Occupation: retired  Tobacco Use   Smoking status: Every Day    Packs/day: 1    Types: Cigarettes   Smokeless tobacco: Not on file  Vaping Use   Vaping Use: Not on file  Substance and Sexual Activity   Alcohol use: Yes    Comment: rarely   Drug use: Never   Sexual activity: Not on file  Other Topics Concern   Not on file  Social History Narrative   Not on file   Social Determinants of Health   Financial Resource Strain: Not on file  Food Insecurity: Not on file  Transportation Needs: Not on file  Physical Activity: Not on file  Stress: Not on file  Social Connections: Not on file  Intimate Partner Violence: Not on file   Family History  Problem Relation Age of Onset   Stroke Father    Diabetes Sister    Hypertension Sister    Arthritis Other    Heart disease Other    Hypertension Other    Stroke Other    Kidney disease Other    Diabetes Other        VITAL SIGNS BP (!) 140/77   Pulse 68   Temp (!) 96.8 F (36 C)   Resp (!) 22   Ht 5\' 4"  (1.626 m)   Wt 198 lb 3.2 oz (89.9 kg)   SpO2 99%   BMI 34.02 kg/m   Outpatient Encounter Medications as of 09/15/2022  Medication Sig   acetaminophen (TYLENOL) 650 MG CR tablet Take 650 mg by mouth 3 (three) times daily.   Amino Acids-Protein Hydrolys (FEEDING SUPPLEMENT, PRO-STAT SUGAR FREE 64,) LIQD Take 30 mLs by mouth 2 (two) times daily.   aspirin 81 MG tablet Take 81 mg by mouth daily.   atorvastatin (LIPITOR) 20 MG tablet Take 20 mg by mouth daily.   calcium-vitamin D (OSCAL WITH D) 500-5 MG-MCG tablet Take 1 tablet by mouth daily.   clobetasol cream (TEMOVATE) 0.05 % Apply 1 Application topically 2 (two) times a week. Apply to labia/vaginal area twice a week. Once A Day on Mon, Fri   dapagliflozin propanediol (FARXIGA) 5 MG TABS tablet Take 5 mg  by mouth daily.   dorzolamide-timolol (COSOPT) 22.3-6.8 MG/ML ophthalmic solution Place 1 drop into both eyes daily.  Wait at least 5 minutes between multiple drops in same eye.   ferrous sulfate 325 (65 FE) MG tablet Take 325 mg by mouth as directed. On Monday and Thursday   furosemide (LASIX) 20 MG tablet Take 20 mg by mouth daily.   Insulin Glargine (BASAGLAR KWIKPEN) 100 UNIT/ML Inject 25 Units into the skin at bedtime.   insulin lispro (HUMALOG KWIKPEN) 100 UNIT/ML KwikPen Inject 7 Units into the skin 3 (three) times daily. Inject 7 units subcutaneous with meals.   latanoprost (XALATAN) 0.005 % ophthalmic solution Place 1 drop into both eyes daily.   loratadine (CLARITIN) 10 MG tablet Take 1 tablet (10 mg total) by mouth daily as needed for itching or rhinitis.   NON FORMULARY Diet:Regular   PRESCRIPTION MEDICATION Endit Skin Protectant Ointment; topical Special Instructions: Apply to vaginal/perineal area bid. Twice A Day   No facility-administered encounter medications on file as of 09/15/2022.     SIGNIFICANT DIAGNOSTIC  EXAMS   PREVIOUS   07-25-21: dexa: t score: -2.521  NO NEW EXAMS   PREVIOUS   09-26-21: hgb a1c 9.0   10-07-21: glucose 282; bun 47; creat 1.90; k+ 4.3; na++ 137; ca 8.8; gfr 27 11-06-21: glucose 104; bun 36; creat 1.77; k+ 4.4; na++ 137; ca 8.4; gfr 29 11-18-21: tsh 2.112  12-23-21: hgb a1c 10.0 01-09-22: wbc 10.0; hgb 10.2; hct 30.1; mcv 93.5 plt 209; glucose 256; bun 44; creat 1.74; k+ 4.4; na++ 135; ca 8.5; gfr 30; protein 6.2; albumin 3.0  04-14-22: hgb A1c 7.0; ACR 134; micro-albumin 140.6 04-28-22: tsh 0.393   05-01-22: wbc 7.5; hgb 9.5; hct 29.7; mcv 97.1 plt 242; glucose 126; bun 42; creat 1.68; k+ 4.4; na++ 139; ca 8.0; gfr 31; protein 5.5 albumin 2.8  06-05-22: glucose 113; bun 59; creat 1.82; k+ 4.0; na++ 138; ca 8.2; gfr 28 08-14-22: albumin 3.1   TODAY  09-11-22: hgb A1c 8.8     Review of Systems  Constitutional:  Negative for malaise/fatigue.  Respiratory:  Negative for cough and shortness of breath.   Cardiovascular:  Negative for chest pain, palpitations and leg swelling.  Gastrointestinal:  Negative for abdominal pain, constipation and heartburn.  Musculoskeletal:  Negative for back pain, joint pain and myalgias.  Skin: Negative.   Neurological:  Negative for dizziness.  Psychiatric/Behavioral:  The patient is not nervous/anxious.    Physical Exam Constitutional:      General: She is not in acute distress.    Appearance: She is well-developed. She is obese. She is not diaphoretic.  Neck:     Thyroid: No thyromegaly.  Cardiovascular:     Rate and Rhythm: Normal rate and regular rhythm.     Pulses: Normal pulses.     Heart sounds: Normal heart sounds.  Pulmonary:     Effort: Pulmonary effort is normal. No respiratory distress.     Breath sounds: Normal breath sounds.  Abdominal:     General: Bowel sounds are normal. There is no distension.     Palpations: Abdomen is soft.     Tenderness: There is no abdominal tenderness.  Musculoskeletal:        General: Normal  range of motion.     Cervical back: Neck supple.     Right lower leg: No edema.     Left lower leg: No edema.  Lymphadenopathy:     Cervical: No cervical adenopathy.  Skin:  General: Skin is warm and dry.  Neurological:     Mental Status: She is alert. Mental status is at baseline.  Psychiatric:        Mood and Affect: Mood normal.       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   Synthia Innocent NP [facility]  call [phone]

## 2022-10-06 ENCOUNTER — Encounter: Payer: Self-pay | Admitting: Adult Health

## 2022-10-06 ENCOUNTER — Non-Acute Institutional Stay: Payer: Self-pay | Admitting: Adult Health

## 2022-10-06 DIAGNOSIS — E1122 Type 2 diabetes mellitus with diabetic chronic kidney disease: Secondary | ICD-10-CM | POA: Diagnosis not present

## 2022-10-06 DIAGNOSIS — N1832 Chronic kidney disease, stage 3b: Secondary | ICD-10-CM

## 2022-10-06 DIAGNOSIS — Z794 Long term (current) use of insulin: Secondary | ICD-10-CM

## 2022-10-06 NOTE — Progress Notes (Unsigned)
Location:  [facility] Room Number: 101 Place of Service:  SNF (31)   CODE STATUS: dnr   No Known Allergies  Chief Complaint  Patient presents with   Acute Visit    Diabetes     HPI:  Her cbg readings remain elevated from the 200-400 range. She is presently taking basaglar 25 units nightly humalog 9 units with meals and farxiga 5 mg daily.  There are no reports of anxiety or worsening confusion present. She is tolerating her medications easily.   Past Medical History:  Diagnosis Date   CAD (coronary artery disease)    DM (diabetes mellitus)  with CKD    GFR 30   Glaucoma    HTN (hypertension)    Obesity    Osteoarthritis     Past Surgical History:  Procedure Laterality Date   CHOLECYSTECTOMY     CORONARY STENT PLACEMENT     x3   REFRACTIVE SURGERY     detached retina   TUBAL LIGATION     77 yrs old    Social History   Socioeconomic History   Marital status: Divorced    Spouse name: Not on file   Number of children: 2   Years of education: Not on file   Highest education level: Not on file  Occupational History   Occupation: retired  Tobacco Use   Smoking status: Every Day    Current packs/day: 1.00    Types: Cigarettes   Smokeless tobacco: Not on file  Vaping Use   Vaping status: Not on file  Substance and Sexual Activity   Alcohol use: Yes    Comment: rarely   Drug use: Never   Sexual activity: Not on file  Other Topics Concern   Not on file  Social History Narrative   Not on file   Social Determinants of Health   Financial Resource Strain: Unknown (05/03/2021)   Received from [facility] [employer] [facility], [facility] [place]   Overall Financial Resource Strain (CARDIA)    Difficulty of Paying Living Expenses: Patient declined  Food Insecurity: Unknown (05/03/2021)   Received from [facility] [place], [facility] [place]   Hunger Vital Sign    Worried About Running Out of Food in the Last Year:  Patient declined    Barista in the Last Year: Patient declined  Transportation Needs: Unknown (05/03/2021)   Received from [facility] [place], [facility] [place]   [employer] - Administrator, Civil Service (Medical): Patient declined    Lack of Transportation (Non-Medical): Patient declined  Physical Activity: Not on file  Stress: Not on file  Social Connections: Unknown (05/06/2022)   Received from [facility], [facility]   Social Network    Social Network: Not on file  Intimate Partner Violence: Unknown (05/06/2022)   Received from [facility], [facility]   HITS    Physically Hurt: Not on file    Insult or Talk Down To: Not on file    Threaten Physical Harm: Not on file    Scream or Curse: Not on file   Family History  Problem Relation Age of Onset   Stroke Father    Diabetes Sister    Hypertension Sister    Arthritis Other    Heart disease Other    Hypertension Other    Stroke Other    Kidney disease Other    Diabetes  Other       VITAL SIGNS BP (!) 145/72   Pulse 66   Temp 97.6 F (36.4 C)   Resp 20   Ht 5\' 4"  (1.626 m)   Wt 198 lb 3.2 oz (89.9 kg)   SpO2 100%   BMI 34.02 kg/m   Outpatient Encounter Medications as of 10/06/2022  Medication Sig   acetaminophen (TYLENOL) 650 MG CR tablet Take 650 mg by mouth 3 (three) times daily.   Amino Acids-Protein Hydrolys (FEEDING SUPPLEMENT, PRO-STAT SUGAR FREE 64,) LIQD Take 30 mLs by mouth 2 (two) times daily.   aspirin 81 MG tablet Take 81 mg by mouth daily.   atorvastatin (LIPITOR) 20 MG tablet Take 20 mg by mouth daily.   calcium-vitamin D (OSCAL WITH D) 500-5 MG-MCG tablet Take 1 tablet by mouth daily.   clobetasol cream (TEMOVATE) 0.05 % Apply 1 Application topically 2 (two) times a week. Apply to labia/vaginal area twice a week. Once A Day on Mon, Fri   dapagliflozin propanediol (FARXIGA) 5 MG TABS tablet Take 5 mg by mouth daily.   dorzolamide-timolol (COSOPT) 22.3-6.8  MG/ML ophthalmic solution Place 1 drop into both eyes daily.  Wait at least 5 minutes between multiple drops in same eye.   ferrous sulfate 325 (65 FE) MG tablet Take 325 mg by mouth as directed. On Monday and Thursday   furosemide (LASIX) 20 MG tablet Take 20 mg by mouth daily.   Insulin Glargine (BASAGLAR KWIKPEN) 100 UNIT/ML Inject 25 Units into the skin at bedtime.   insulin lispro (HUMALOG KWIKPEN) 100 UNIT/ML KwikPen Inject 7 Units into the skin 3 (three) times daily. Inject 7 units subcutaneous with meals.   latanoprost (XALATAN) 0.005 % ophthalmic solution Place 1 drop into both eyes daily.   loratadine (CLARITIN) 10 MG tablet Take 1 tablet (10 mg total) by mouth daily as needed for itching or rhinitis.   NON FORMULARY Diet:Regular   PRESCRIPTION MEDICATION Endit Skin Protectant Ointment; topical Special Instructions: Apply to vaginal/perineal area bid. Twice A Day   No facility-administered encounter medications on file as of 10/06/2022.     SIGNIFICANT DIAGNOSTIC EXAMS  PREVIOUS   07-25-21: dexa: t score: -2.521  NO NEW EXAMS   PREVIOUS   09-26-21: hgb a1c 9.0   10-07-21: glucose 282; bun 47; creat 1.90; k+ 4.3; na++ 137; ca 8.8; gfr 27 11-06-21: glucose 104; bun 36; creat 1.77; k+ 4.4; na++ 137; ca 8.4; gfr 29 11-18-21: tsh 2.112  12-23-21: hgb a1c 10.0 01-09-22: wbc 10.0; hgb 10.2; hct 30.1; mcv 93.5 plt 209; glucose 256; bun 44; creat 1.74; k+ 4.4; na++ 135; ca 8.5; gfr 30; protein 6.2; albumin 3.0  04-14-22: hgb A1c 7.0; ACR 134; micro-albumin 140.6 04-28-22: tsh 0.393   05-01-22: wbc 7.5; hgb 9.5; hct 29.7; mcv 97.1 plt 242; glucose 126; bun 42; creat 1.68; k+ 4.4; na++ 139; ca 8.0; gfr 31; protein 5.5 albumin 2.8  06-05-22: glucose 113; bun 59; creat 1.82; k+ 4.0; na++ 138; ca 8.2; gfr 28 08-14-22: albumin 3.1   TODAY  09-11-22: hgb A1c 8.8     Review of Systems  Constitutional:  Negative for malaise/fatigue.  Respiratory:  Negative for cough and shortness of breath.    Cardiovascular:  Negative for chest pain, palpitations and leg swelling.  Gastrointestinal:  Negative for abdominal pain, constipation and heartburn.  Musculoskeletal:  Negative for back pain, joint pain and myalgias.  Skin: Negative.   Neurological:  Negative for dizziness.  Psychiatric/Behavioral:  The patient  is not nervous/anxious.    Physical Exam Constitutional:      General: She is not in acute distress.    Appearance: She is well-developed. She is obese. She is not diaphoretic.  Neck:     Thyroid: No thyromegaly.  Cardiovascular:     Rate and Rhythm: Normal rate and regular rhythm.     Pulses: Normal pulses.     Heart sounds: Normal heart sounds.  Pulmonary:     Effort: Pulmonary effort is normal. No respiratory distress.     Breath sounds: Normal breath sounds.  Abdominal:     General: Bowel sounds are normal. There is no distension.     Palpations: Abdomen is soft.     Tenderness: There is no abdominal tenderness.  Musculoskeletal:        General: Normal range of motion.     Cervical back: Neck supple.     Right lower leg: No edema.     Left lower leg: No edema.  Lymphadenopathy:     Cervical: No cervical adenopathy.  Skin:    General: Skin is warm and dry.  Neurological:     Mental Status: She is alert. Mental status is at baseline.  Psychiatric:        Mood and Affect: Mood normal.        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  Hgb A1c 8.8  will increase basaglar to 30 units nightly and will increase humalog to 12 units with meals.    Synthia Innocent NP [facility]  call [phone]

## 2022-10-15 ENCOUNTER — Non-Acute Institutional Stay: Payer: Self-pay | Admitting: Internal Medicine

## 2022-10-15 ENCOUNTER — Encounter: Payer: Self-pay | Admitting: Internal Medicine

## 2022-10-15 DIAGNOSIS — F0390 Unspecified dementia without behavioral disturbance: Secondary | ICD-10-CM

## 2022-10-15 DIAGNOSIS — I10 Essential (primary) hypertension: Secondary | ICD-10-CM | POA: Diagnosis not present

## 2022-10-15 DIAGNOSIS — Z794 Long term (current) use of insulin: Secondary | ICD-10-CM

## 2022-10-15 DIAGNOSIS — N1832 Chronic kidney disease, stage 3b: Secondary | ICD-10-CM

## 2022-10-15 DIAGNOSIS — R7989 Other specified abnormal findings of blood chemistry: Secondary | ICD-10-CM

## 2022-10-15 DIAGNOSIS — E1122 Type 2 diabetes mellitus with diabetic chronic kidney disease: Secondary | ICD-10-CM

## 2022-10-15 NOTE — Assessment & Plan Note (Addendum)
Update TSH to rule out subclinical hyperthyroidism.  Clinically this is not present but there has been a significant decrease serially in the TSH in the absence of L-thyroxine supplementation.

## 2022-10-15 NOTE — Assessment & Plan Note (Signed)
BP controlled w/o antihypertensive medications

## 2022-10-15 NOTE — Assessment & Plan Note (Signed)
She has severe dementia but is pleasant and interactive.  Complete review of systems is negative including any diabetic or dermatologic symptoms despite severe inguinal rash which is apparently very pruritic according to staff.

## 2022-10-15 NOTE — Patient Instructions (Signed)
See assessment and plan under each diagnosis in the problem list and acutely for this visit 

## 2022-10-15 NOTE — Assessment & Plan Note (Addendum)
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  Off Fargixa because of the inguinal dermatitis her A1c is 8.8%.  The SGLT-2 inhibitor could be reinitiated & the rash monitored.  She is definitely not a candidate for metformin because of the ESRD.

## 2022-10-15 NOTE — Progress Notes (Unsigned)
NURSING HOME LOCATION:  [facility] ROOM NUMBER:  [id number]  CODE STATUS:  DNR  PCP:  Synthia Innocent NP  This is a nursing facility follow up visit of chronic medical diagnoses & to document compliance with Regulation 483.30 (c) in The Long Term Care Survey Manual Phase 2 which mandates caregiver visit ( visits can alternate among physician, PA or NP as per statutes) within 10 days of 30 days / 60 days/ 90 days post admission to SNF date    Interim medical record and care since last SNF visit was updated with review of diagnostic studies and change in clinical status since last visit were documented.  HPI: She is a permanent resident of this facility with medical diagnoes of CAD, HTN, & DM with CKD. Her most recent A1c was 8.8% on 09/11/2022.  This would be an average glucose of 205.86.  Serially there has been improvement in her albumin with prior values of 2.8 and most recent value 3.1.  Renal function is essentially stable to slightly worse with current creatinine of 1.82 and GFR 28 indicating CKD stage IV.  Her prior values were 1.68 with a GFR of 31 indicating low CKD stage IIIb.  In February H/H was 9.5/29.7.  Also in February TSH was low normal at 0.393 down from values of 2.112 on the 911/23.  She is not on a thyroid supplement.  Review of systems: Dementia invalidated responses.  When I explained I was there to see her for a routine every 15-month visit; she responded "I have not been here for 3 months."  When I asked how long she said maybe a week."  She had complete negative review of systems and stated "not sure why I am here."  She went on to say "if I had that I would know it." Staff reports that she continues to have a severe inguinal rash which she scratches.  At this time she tells me she has no rash and no itching.  Constitutional: No fever, significant weight change, fatigue  Eyes: No redness, discharge, pain, vision change ENT/mouth: No nasal congestion,  purulent  discharge, earache, change in hearing, sore throat  Cardiovascular: No chest pain, palpitations, paroxysmal nocturnal dyspnea, claudication, edema  Respiratory: No cough, sputum production, hemoptysis, DOE, significant snoring, apnea   Gastrointestinal: No heartburn, dysphagia, abdominal pain, nausea /vomiting, rectal bleeding, melena, change in bowels Genitourinary: No dysuria, hematuria, pyuria, incontinence, nocturia Musculoskeletal: No joint stiffness, joint swelling, weakness, pain Dermatologic: No rash, pruritus, change in appearance of skin Neurologic: No dizziness, headache, syncope, seizures, numbness, tingling Psychiatric: No significant anxiety, depression, insomnia, anorexia Endocrine: No change in hair/skin/nails, excessive thirst, excessive hunger, excessive urination  Hematologic/lymphatic: No significant bruising, lymphadenopathy, abnormal bleeding Allergy/immunology: No itchy/watery eyes, significant sneezing, urticaria, angioedema  Physical exam:  Pertinent or positive findings: She is exhibiting severe dementia but is very pleasant and interactive.  Lids are puffy.  The right eyebrow tends to remain higher than the left but there is normal range of motion of the eyebrows.  She is completely edentulous.  A grade 1/2 systolic murmur suggested at the base.  First heart sounds increased.  Abdomen is protuberant.  She has 1.5+ edema at the sock line.  Pedal pulses are not palpable.  She has scattered ecchymoses over the forearms. General appearance: Adequately nourished; no acute distress, increased work of breathing is present.   Lymphatic: No lymphadenopathy about the head, neck, axilla. Eyes: No conjunctival inflammation or lid edema is present. There  is no scleral icterus. Ears:  External ear exam shows no significant lesions or deformities.   Nose:  External nasal examination shows no deformity or inflammation. Nasal mucosa are pink and moist without lesions, exudates Oral  exam:  Lips and gums are healthy appearing. There is no oropharyngeal erythema or exudate. Neck:  No thyromegaly, masses, tenderness noted.    Heart:  Normal rate and regular rhythm. S1 and S2 normal without gallop, murmur, click, rub .  Lungs: Chest clear to auscultation without wheezes, rhonchi, rales, rubs. Abdomen: Bowel sounds are normal. Abdomen is soft and nontender with no organomegaly, hernias, masses. GU: Deferred  Extremities:  No cyanosis, clubbing, edema  Neurologic exam : Cn 2-7 intact Strength equal  in upper & lower extremities Balance, Rhomberg, finger to nose testing could not be completed due to clinical state Deep tendon reflexes are equal Skin: Warm & dry w/o tenting. No significant lesions or rash.  See summary under each active problem in the Problem List with associated updated therapeutic plan

## 2022-10-21 DIAGNOSIS — M159 Polyosteoarthritis, unspecified: Secondary | ICD-10-CM | POA: Diagnosis not present

## 2022-10-21 DIAGNOSIS — M810 Age-related osteoporosis without current pathological fracture: Secondary | ICD-10-CM | POA: Diagnosis not present

## 2022-10-21 DIAGNOSIS — I1310 Hypertensive heart and chronic kidney disease without heart failure, with stage 1 through stage 4 chronic kidney disease, or unspecified chronic kidney disease: Secondary | ICD-10-CM | POA: Diagnosis not present

## 2022-10-21 DIAGNOSIS — R279 Unspecified lack of coordination: Secondary | ICD-10-CM | POA: Diagnosis not present

## 2022-11-11 ENCOUNTER — Non-Acute Institutional Stay (SKILLED_NURSING_FACILITY): Payer: Medicare Other | Admitting: Adult Health

## 2022-11-11 ENCOUNTER — Encounter: Payer: Self-pay | Admitting: Adult Health

## 2022-11-11 DIAGNOSIS — I129 Hypertensive chronic kidney disease with stage 1 through stage 4 chronic kidney disease, or unspecified chronic kidney disease: Secondary | ICD-10-CM

## 2022-11-11 DIAGNOSIS — Z794 Long term (current) use of insulin: Secondary | ICD-10-CM

## 2022-11-11 DIAGNOSIS — N1831 Chronic kidney disease, stage 3a: Secondary | ICD-10-CM

## 2022-11-11 DIAGNOSIS — F339 Major depressive disorder, recurrent, unspecified: Secondary | ICD-10-CM

## 2022-11-11 DIAGNOSIS — E1122 Type 2 diabetes mellitus with diabetic chronic kidney disease: Secondary | ICD-10-CM | POA: Diagnosis not present

## 2022-11-11 DIAGNOSIS — E440 Moderate protein-calorie malnutrition: Secondary | ICD-10-CM

## 2022-11-11 NOTE — Progress Notes (Signed)
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  There are no reports of uncontrolled pain. She has required to have her insulins adjusted this past month. Her blood pressure readings remain elevated.   Past Medical History:  Diagnosis Date   CAD (coronary artery disease)    DM (diabetes mellitus)  with CKD    GFR 30   Glaucoma    HTN (hypertension)    Obesity    Osteoarthritis     Past Surgical History:  Procedure Laterality Date   CHOLECYSTECTOMY     CORONARY STENT PLACEMENT     x3   REFRACTIVE SURGERY     detached retina   TUBAL LIGATION     77 yrs old    Social History   Socioeconomic History   Marital status: Divorced    Spouse name: Not on file   Number of children: 2   Years of education: Not on file   Highest education level: Not on file  Occupational History   Occupation: retired  Tobacco Use   Smoking status: Every Day    Current packs/day: 1.00    Types: Cigarettes   Smokeless tobacco: Not on file  Vaping Use   Vaping status: Not on file  Substance and Sexual Activity   Alcohol use: Yes    Comment: rarely   Drug use: Never   Sexual activity: Not on file  Other Topics Concern   Not on file  Social History Narrative   Not on file   Social Determinants of Health   Financial Resource Strain: Unknown (05/03/2021)   Received from [facility], [facility] [place]   Overall Financial Resource Strain (CARDIA)    Difficulty of Paying Living Expenses: Patient declined  Food Insecurity: Unknown (05/03/2021)   Received from [facility] [place], [facility] [place] [facility]   Hunger Vital Sign    Worried About Running Out of Food in the Last Year: Patient declined    Barista in the Last Year: Patient declined  Transportation Needs: Unknown (05/03/2021)   Received from [facility] [employer], [facility] [place]   [employer] - Administrator, Civil Service (Medical): Patient declined    Lack of Transportation (Non-Medical): Patient declined  Physical Activity: Not on file  Stress: Not on file  Social Connections: Unknown (05/06/2022)   Received from [facility], [facility]   Social Network    Social Network: Not on file  Intimate Partner Violence: Unknown (05/06/2022)   Received from [facility] [place], [facility]   HITS    Physically Hurt: Not on file    Insult or Talk Down To: Not on file    Threaten Physical Harm: Not on file  Scream or Curse: Not on file   Family History  Problem Relation Age of Onset   Stroke Father    Diabetes Sister    Hypertension Sister    Arthritis Other    Heart disease Other    Hypertension Other    Stroke Other    Kidney disease Other    Diabetes Other       VITAL SIGNS BP (!) 146/70   Pulse 70   Temp 98.3 F (36.8 C)   Resp 20   Ht 5\' 4"  (1.626 m)   Wt 198 lb 8 oz (90 kg)   SpO2 97%   BMI 34.07 kg/m   Outpatient Encounter Medications as of 11/11/2022  Medication Sig   acetaminophen (TYLENOL) 650 MG CR tablet Take 650 mg by mouth 3 (three) times daily.   Amino Acids-Protein Hydrolys (FEEDING SUPPLEMENT, PRO-STAT SUGAR FREE 64,) LIQD Take 30 mLs by mouth 2 (two) times daily.   aspirin 81 MG tablet Take 81 mg by mouth daily.   atorvastatin (LIPITOR) 20 MG tablet Take 20 mg by mouth daily.   calcium-vitamin D (OSCAL WITH D)  500-5 MG-MCG tablet Take 1 tablet by mouth daily.   clobetasol cream (TEMOVATE) 0.05 % Apply 1 Application topically 2 (two) times a week. Apply to labia/vaginal area twice a week. Once A Day on Mon, Fri   dapagliflozin propanediol (FARXIGA) 5 MG TABS tablet Take 5 mg by mouth daily.   dorzolamide-timolol (COSOPT) 22.3-6.8 MG/ML ophthalmic solution Place 1 drop into both eyes daily.  Wait at least 5 minutes between multiple drops in same eye.   ferrous sulfate 325 (65 FE) MG tablet Take 325 mg by mouth as directed. On Monday and Thursday   furosemide (LASIX) 20 MG tablet Take 20 mg by mouth daily.   Insulin Glargine (BASAGLAR KWIKPEN) 100 UNIT/ML Inject 30 Units into the skin at bedtime.   insulin lispro (HUMALOG KWIKPEN) 100 UNIT/ML KwikPen Inject 12 Units into the skin 3 (three) times daily. Inject 7 units subcutaneous with meals.   latanoprost (XALATAN) 0.005 % ophthalmic solution Place 1 drop into both eyes daily.   loratadine (CLARITIN) 10 MG tablet Take 1 tablet (10 mg total) by mouth daily as needed for itching or rhinitis.   NON FORMULARY Diet:Regular   PRESCRIPTION MEDICATION Endit Skin Protectant Ointment; topical Special Instructions: Apply to vaginal/perineal area bid. Twice A Day   No facility-administered encounter medications on file as of 11/11/2022.     SIGNIFICANT DIAGNOSTIC EXAMS  [place]   07-25-21: dexa: t score: -2.521  NO NEW EXAMS   PREVIOUS   11-18-21: tsh 2.112  12-23-21: hgb a1c 10.0 01-09-22: wbc 10.0; hgb 10.2; hct 30.1; mcv 93.5 plt 209; glucose 256; bun 44; creat 1.74; k+ 4.4; na++ 135; ca 8.5; gfr 30; protein 6.2; albumin 3.0  04-14-22: hgb A1c 7.0; ACR 134; micro-albumin 140.6 04-28-22: tsh 0.393   05-01-22: wbc 7.5; hgb 9.5; hct 29.7; mcv 97.1 plt 242; glucose 126; bun 42; creat 1.68; k+ 4.4; na++ 139; ca 8.0; gfr 31; protein 5.5 albumin 2.8  06-05-22: glucose 113; bun 59; creat 1.82; k+ 4.0; na++ 138; ca 8.2; gfr 28 08-14-22: albumin 3.1   TODAY  09-11-22: hgb  A1c 8.8     Review of Systems  Constitutional:  Negative for malaise/fatigue.  Respiratory:  Negative for cough and shortness of breath.   Cardiovascular:  Negative for chest pain, palpitations and leg swelling.  Gastrointestinal:  Negative for abdominal pain, constipation and heartburn.  Musculoskeletal:  Negative for back pain, joint pain and myalgias.  Skin: Negative.   Neurological:  Negative for dizziness.  Psychiatric/Behavioral:  The patient is not nervous/anxious.    Physical Exam Constitutional:      General: She is not in acute distress.    Appearance: She is well-developed. She is obese. She is not diaphoretic.  Neck:     Thyroid: No thyromegaly.  Cardiovascular:     Rate and Rhythm: Normal rate and regular rhythm.     Pulses: Normal pulses.     Heart sounds: Normal heart sounds.  Pulmonary:     Effort: Pulmonary effort is normal. No respiratory distress.     Breath sounds: Normal breath sounds.  Abdominal:     General: Bowel sounds are normal. There is no distension.     Palpations: Abdomen is soft.     Tenderness: There is no abdominal tenderness.  Musculoskeletal:        General: Normal range of motion.     Cervical back: Neck supple.     Right lower leg: No edema.     Left lower leg: No edema.  Lymphadenopathy:     Cervical: No cervical adenopathy.  Skin:    General: Skin is warm and dry.  Neurological:     Mental Status: She is alert. Mental status is at baseline.  Psychiatric:        Mood and Affect: Mood normal.       ASSESSMENT/ PLAN:  TODAY  Protein calorie malnutrition protein 5.5; albumin 2.8; will continue supplements as directed  2. Chronic depression: is off remeron due to history of falls  3. 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 Post menopausal osteopenia: t score -2.561 will monitor   5.  Controlled type 2 diabetes mellitus with stage 3 chronic kidney disease with long term current use of insulin: hgb A1c 8.8; will continue farxiga 5 mg daily; basaglar 30 units nightly; novolog 12 units with meals.   6. Stage 3 chronic kidney disease due to type 2 diabetes mellitus: bun 59; creat 1.82; gfr 28 will continue farxiga 5 mg daily   7. Hyperlipidemia associated with type 2 diabetes mellitus: ldl 50 will continue lipitor 20 mg daily   8. Anemia due to stage 3 chronic kidney disease: hgb 8.6; will continue iron twice weekly   9. Increased intraocular pressure bilateral: will continue cosopt and xalatan to both eyes  10. Dementia without behavioral disturbance: weight is 198 pounds.    Will check cbc; cmp lipids   Synthia Innocent NP [facility]   call [phone]

## 2022-11-28 ENCOUNTER — Non-Acute Institutional Stay (SKILLED_NURSING_FACILITY): Payer: Medicare Other | Admitting: Adult Health

## 2022-11-28 ENCOUNTER — Encounter: Payer: Self-pay | Admitting: Adult Health

## 2022-11-28 DIAGNOSIS — I129 Hypertensive chronic kidney disease with stage 1 through stage 4 chronic kidney disease, or unspecified chronic kidney disease: Secondary | ICD-10-CM

## 2022-11-28 DIAGNOSIS — F0390 Unspecified dementia without behavioral disturbance: Secondary | ICD-10-CM

## 2022-11-28 DIAGNOSIS — E1122 Type 2 diabetes mellitus with diabetic chronic kidney disease: Secondary | ICD-10-CM | POA: Diagnosis not present

## 2022-11-28 DIAGNOSIS — F339 Major depressive disorder, recurrent, unspecified: Secondary | ICD-10-CM

## 2022-11-28 DIAGNOSIS — N1831 Chronic kidney disease, stage 3a: Secondary | ICD-10-CM

## 2022-11-28 NOTE — Progress Notes (Signed)
Location:  [facility] Room Number: North/101/P Place of Service:  SNF (31)   CODE STATUS: dnr   No Known Allergies  Chief Complaint  Patient presents with   Acute Visit    Care Plan Meeting    HPI:  We have come together for her care plan meeting. Family present BIMS 7/15 mood 0/30. She uses wheelchair with one fall without injury. She is dependent upon staff for her adl care. She is incontinent of bladder and bowel. Dietary: independent with meals; regular diet weight is 198.5 pounds appetite 76-100%. Therapy: none at this time. Activities: does attend frequently. 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     Dementia without behavioral disturbance      Depression, recurrent   Past Medical History:  Diagnosis Date   CAD (coronary artery disease)    DM (diabetes mellitus)  with CKD    GFR 30   Glaucoma    HTN (hypertension)    Obesity    Osteoarthritis     Past Surgical History:  Procedure Laterality Date   CHOLECYSTECTOMY     CORONARY STENT PLACEMENT     x3   REFRACTIVE SURGERY     detached retina   TUBAL LIGATION     77 yrs old    Social History   Socioeconomic History   Marital status: Divorced    Spouse name: Not on file   Number of children: 2   Years of education: Not on file   Highest education level: Not on file  Occupational History   Occupation: retired  Tobacco Use   Smoking status: Every Day    Current packs/day: 1.00    Types: Cigarettes   Smokeless tobacco: Not on file  Vaping Use   Vaping status: Not on file  Substance and Sexual Activity   Alcohol use: Yes    Comment: rarely   Drug use: Never   Sexual activity: Not on file  Other Topics Concern   Not on file  Social History Narrative   Not on file   Social Determinants of Health   Financial Resource Strain: Unknown (05/03/2021)   Received  from [facility] [employer], [facility] [place]   Overall Financial Resource Strain (CARDIA)    Difficulty of Paying Living Expenses: Patient declined  Food Insecurity: Unknown (05/03/2021)   Received from [facility] [place], [facility] [place]   Hunger Vital Sign    Worried About Running Out of Food in the Last Year: Patient declined    Barista in the Last Year: Patient declined  Transportation Needs: Unknown (05/03/2021)   Received from [facility] [employer], [facility] [place]   [employer] - Administrator, Civil Service (Medical): Patient declined    Lack of Transportation (Non-Medical): Patient declined  Physical Activity: Not on file  Stress: Not on file  Social Connections: Unknown (05/06/2022)   Received from [facility], [facility]   Social Network    Social Network: Not on file  Intimate Partner Violence: Unknown (05/06/2022)   Received from [facility], [facility]   HITS    Physically Hurt: Not on file    Insult or Talk Down To: Not on file    Threaten Physical Harm: Not on file    Scream or Curse: Not on  file   Family History  Problem Relation Age of Onset   Stroke Father    Diabetes Sister    Hypertension Sister    Arthritis Other    Heart disease Other    Hypertension Other    Stroke Other    Kidney disease Other    Diabetes Other       VITAL SIGNS BP 132/63   Pulse 72   Temp 98.5 F (36.9 C)   Resp 20   Ht 5\' 4"  (1.626 m)   Wt 198 lb 8 oz (90 kg)   SpO2 94%   BMI 34.07 kg/m   Outpatient Encounter Medications as of 11/28/2022  Medication Sig   acetaminophen (TYLENOL) 650 MG CR tablet Take 650 mg by mouth 3 (three) times daily.   Amino Acids-Protein Hydrolys (FEEDING SUPPLEMENT, PRO-STAT SUGAR FREE 64,) LIQD Take 30 mLs by mouth 2 (two) times daily.   amLODipine (NORVASC) 2.5 MG tablet Take 2.5 mg by mouth daily.   aspirin 81 MG tablet Take 81 mg by mouth daily.   atorvastatin  (LIPITOR) 20 MG tablet Take 20 mg by mouth daily.   calcium-vitamin D (OSCAL WITH D) 500-5 MG-MCG tablet Take 1 tablet by mouth daily.   clobetasol cream (TEMOVATE) 0.05 % Apply 1 Application topically 2 (two) times a week. Apply to labia/vaginal area twice a week. Once A Day on Mon, Fri   dapagliflozin propanediol (FARXIGA) 5 MG TABS tablet Take 5 mg by mouth daily.   dorzolamide-timolol (COSOPT) 22.3-6.8 MG/ML ophthalmic solution Place 1 drop into both eyes daily.  Wait at least 5 minutes between multiple drops in same eye.   ferrous sulfate 325 (65 FE) MG tablet Take 325 mg by mouth as directed. On Monday and Thursday   furosemide (LASIX) 20 MG tablet Take 20 mg by mouth daily.   Insulin Glargine (BASAGLAR KWIKPEN) 100 UNIT/ML Inject 30 Units into the skin at bedtime.   insulin lispro (HUMALOG KWIKPEN) 100 UNIT/ML KwikPen Inject 12 Units into the skin with breakfast, with lunch, and with evening meal.   latanoprost (XALATAN) 0.005 % ophthalmic solution Place 1 drop into both eyes daily.   loratadine (CLARITIN) 10 MG tablet Take 1 tablet (10 mg total) by mouth daily as needed for itching or rhinitis.   NON FORMULARY Diet:Regular   PRESCRIPTION MEDICATION Endit Skin Protectant Ointment; topical Special Instructions: Apply to vaginal/perineal area bid. Twice A Day   No facility-administered encounter medications on file as of 11/28/2022.     SIGNIFICANT DIAGNOSTIC EXAMS  07-25-21: dexa: t score: -2.521  NO NEW EXAMS   PREVIOUS   11-18-21: tsh 2.112  12-23-21: hgb a1c 10.0 01-09-22: wbc 10.0; hgb 10.2; hct 30.1; mcv 93.5 plt 209; glucose 256; bun 44; creat 1.74; k+ 4.4; na++ 135; ca 8.5; gfr 30; protein 6.2; albumin 3.0  04-14-22: hgb A1c 7.0; ACR 134; micro-albumin 140.6 04-28-22: tsh 0.393   05-01-22: wbc 7.5; hgb 9.5; hct 29.7; mcv 97.1 plt 242; glucose 126; bun 42; creat 1.68; k+ 4.4; na++ 139; ca 8.0; gfr 31; protein 5.5 albumin 2.8  06-05-22: glucose 113; bun 59; creat 1.82; k+ 4.0;  na++ 138; ca 8.2; gfr 28 08-14-22: albumin 3.1  09-11-22: hgb A1c 8.8  NO NEW LABS.      Review of Systems  Constitutional:  Negative for malaise/fatigue.  Respiratory:  Negative for cough and shortness of breath.   Cardiovascular:  Negative for chest pain, palpitations and leg swelling.  Gastrointestinal:  Negative for abdominal pain,  constipation and heartburn.  Musculoskeletal:  Negative for back pain, joint pain and myalgias.  Skin: Negative.   Neurological:  Negative for dizziness.  Psychiatric/Behavioral:  The patient is not nervous/anxious.     Physical Exam Constitutional:      General: She is not in acute distress.    Appearance: She is well-developed. She is obese. She is not diaphoretic.  Neck:     Thyroid: No thyromegaly.  Cardiovascular:     Rate and Rhythm: Normal rate and regular rhythm.     Pulses: Normal pulses.     Heart sounds: Normal heart sounds.  Pulmonary:     Effort: Pulmonary effort is normal. No respiratory distress.     Breath sounds: Normal breath sounds.  Abdominal:     General: Bowel sounds are normal. There is no distension.     Palpations: Abdomen is soft.     Tenderness: There is no abdominal tenderness.  Musculoskeletal:        General: Normal range of motion.     Cervical back: Neck supple.     Right lower leg: No edema.     Left lower leg: No edema.  Lymphadenopathy:     Cervical: No cervical adenopathy.  Skin:    General: Skin is warm and dry.  Neurological:     Mental Status: She is alert. Mental status is at baseline.  Psychiatric:        Mood and Affect: Mood normal.     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   Time spent with patient; 40 minutes: medications; dietary;  activities.    Synthia Innocent NP [facility]   call [phone]

## 2022-12-09 ENCOUNTER — Encounter: Payer: Self-pay | Admitting: Adult Health

## 2022-12-09 ENCOUNTER — Non-Acute Institutional Stay (SKILLED_NURSING_FACILITY): Payer: Self-pay | Admitting: Adult Health

## 2022-12-09 DIAGNOSIS — I700 Atherosclerosis of aorta: Secondary | ICD-10-CM | POA: Insufficient documentation

## 2022-12-09 DIAGNOSIS — N1832 Chronic kidney disease, stage 3b: Secondary | ICD-10-CM | POA: Diagnosis not present

## 2022-12-09 DIAGNOSIS — M810 Age-related osteoporosis without current pathological fracture: Secondary | ICD-10-CM

## 2022-12-09 DIAGNOSIS — E1122 Type 2 diabetes mellitus with diabetic chronic kidney disease: Secondary | ICD-10-CM | POA: Diagnosis not present

## 2022-12-09 DIAGNOSIS — Z794 Long term (current) use of insulin: Secondary | ICD-10-CM

## 2022-12-09 NOTE — Progress Notes (Signed)
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 Her weight is stable. There are no reports of uncontrolled pain; no reports of anxiety or depressive thoughts.   Past Medical History:  Diagnosis Date   CAD (coronary artery disease)    DM (diabetes mellitus)  with CKD    GFR 30   Glaucoma    HTN (hypertension)    Obesity    Osteoarthritis     Past Surgical History:  Procedure Laterality Date   CHOLECYSTECTOMY     CORONARY STENT PLACEMENT     x3   REFRACTIVE SURGERY     detached retina   TUBAL LIGATION     77 yrs old    Social History   Socioeconomic History   Marital status: Divorced    Spouse name: Not on file   Number of children: 2   Years of education: Not on file   Highest education level: Not on file  Occupational History   Occupation: retired  Tobacco Use   Smoking status: Every Day    Current packs/day: 1.00    Types: Cigarettes   Smokeless tobacco: Not on file  Vaping Use   Vaping status: Not on file  Substance and Sexual Activity   Alcohol use: Yes    Comment: rarely   Drug use: Never   Sexual activity: Not on file  Other Topics Concern   Not on file  Social History Narrative   Not on file   Social Determinants of Health   Financial Resource Strain: Unknown (05/03/2021)   Received from [facility],  [facility] [place]   Overall Financial Resource Strain (CARDIA)    Difficulty of Paying Living Expenses: Patient declined  Food Insecurity: Unknown (05/03/2021)   Received from [facility] [place], [facility]   Hunger Vital Sign    Worried About Running Out of Food in the Last Year: Patient declined    Barista in the Last Year: Patient declined  Transportation Needs: Unknown (05/03/2021)   Received from [facility], [facility] [place]   [employer] - Administrator, Civil Service (Medical): Patient declined    Lack of Transportation (Non-Medical): Patient declined  Physical Activity: Not on file  Stress: Not on file  Social Connections: Unknown (05/06/2022)   Received from [facility], [facility]   Social Network    Social Network: Not on file  Intimate Partner Violence: Unknown (05/06/2022)   Received from [facility], [facility]   HITS    Physically Hurt: Not on file    Insult or Talk Down To: Not on file    Threaten Physical Harm: Not on file    Scream or Curse:  Not on file   Family History  Problem Relation Age of Onset   Stroke Father    Diabetes Sister    Hypertension Sister    Arthritis Other    Heart disease Other    Hypertension Other    Stroke Other    Kidney disease Other    Diabetes Other       VITAL SIGNS BP 138/76   Pulse 66   Temp (!) 97 F (36.1 C)   Resp 20   Ht 5\' 4"  (1.626 m)   Wt 198 lb 8 oz (90 kg)   SpO2 95%   BMI 34.07 kg/m   Outpatient Encounter Medications as of 12/09/2022  Medication Sig   acetaminophen (TYLENOL) 650 MG CR tablet Take 650 mg by mouth 3 (three) times daily.   Amino Acids-Protein Hydrolys (FEEDING SUPPLEMENT, PRO-STAT SUGAR FREE 64,) LIQD Take 30 mLs by mouth 2 (two) times daily.   amLODipine (NORVASC) 2.5 MG tablet Take 2.5 mg by mouth daily.   aspirin 81 MG tablet Take 81 mg by mouth daily.   atorvastatin (LIPITOR) 20 MG tablet Take 20  mg by mouth daily.   calcium-vitamin D (OSCAL WITH D) 500-5 MG-MCG tablet Take 1 tablet by mouth daily.   clobetasol cream (TEMOVATE) 0.05 % Apply 1 Application topically 2 (two) times a week. Apply to labia/vaginal area twice a week. Once A Day on Mon, Fri   dapagliflozin propanediol (FARXIGA) 5 MG TABS tablet Take 5 mg by mouth daily.   dorzolamide-timolol (COSOPT) 22.3-6.8 MG/ML ophthalmic solution Place 1 drop into both eyes daily.  Wait at least 5 minutes between multiple drops in same eye.   ferrous sulfate 325 (65 FE) MG tablet Take 325 mg by mouth as directed. On Monday and Thursday   furosemide (LASIX) 20 MG tablet Take 20 mg by mouth daily.   Insulin Glargine (BASAGLAR KWIKPEN) 100 UNIT/ML Inject 30 Units into the skin at bedtime.   insulin lispro (HUMALOG KWIKPEN) 100 UNIT/ML KwikPen Inject 12 Units into the skin with breakfast, with lunch, and with evening meal.   latanoprost (XALATAN) 0.005 % ophthalmic solution Place 1 drop into both eyes daily.   loratadine (CLARITIN) 10 MG tablet Take 1 tablet (10 mg total) by mouth daily as needed for itching or rhinitis.   NON FORMULARY Diet:Regular   PRESCRIPTION MEDICATION Endit Skin Protectant Ointment; topical Special Instructions: Apply to vaginal/perineal area bid. Twice A Day   No facility-administered encounter medications on file as of 12/09/2022.     SIGNIFICANT DIAGNOSTIC EXAMS  07-25-21: dexa: t score: -2.521  NO NEW EXAMS   PREVIOUS   12-23-21: hgb a1c 10.0 01-09-22: wbc 10.0; hgb 10.2; hct 30.1; mcv 93.5 plt 209; glucose 256; bun 44; creat 1.74; k+ 4.4; na++ 135; ca 8.5; gfr 30; protein 6.2; albumin 3.0  04-14-22: hgb A1c 7.0; ACR 134; micro-albumin 140.6 04-28-22: tsh 0.393   05-01-22: wbc 7.5; hgb 9.5; hct 29.7; mcv 97.1 plt 242; glucose 126; bun 42; creat 1.68; k+ 4.4; na++ 139; ca 8.0; gfr 31; protein 5.5 albumin 2.8  06-05-22: glucose 113; bun 59; creat 1.82; k+ 4.0; na++ 138; ca 8.2; gfr 28 08-14-22: albumin 3.1   09-11-22: hgb A1c 8.8   NO NEW LABS.     Review of Systems  Constitutional:  Negative for malaise/fatigue.  Respiratory:  Negative for cough and shortness of breath.   Cardiovascular:  Negative for chest pain, palpitations and leg swelling.  Gastrointestinal:  Negative for abdominal pain, constipation  and heartburn.  Musculoskeletal:  Negative for back pain, joint pain and myalgias.  Skin: Negative.   Neurological:  Negative for dizziness.  Psychiatric/Behavioral:  The patient is not nervous/anxious.    Physical Exam Constitutional:      General: She is not in acute distress.    Appearance: She is well-developed. She is obese. She is not diaphoretic.  Neck:     Thyroid: No thyromegaly.  Cardiovascular:     Rate and Rhythm: Normal rate and regular rhythm.     Pulses: Normal pulses.     Heart sounds: Normal heart sounds.  Pulmonary:     Effort: Pulmonary effort is normal. No respiratory distress.     Breath sounds: Normal breath sounds.  Abdominal:     General: Bowel sounds are normal. There is no distension.     Palpations: Abdomen is soft.     Tenderness: There is no abdominal tenderness.  Musculoskeletal:        General: Normal range of motion.     Cervical back: Neck supple.     Right lower leg: No edema.     Left lower leg: No edema.  Lymphadenopathy:     Cervical: No cervical adenopathy.  Skin:    General: Skin is warm and dry.  Neurological:     Mental Status: She is alert. Mental status is at baseline.  Psychiatric:        Mood and Affect: Mood normal.      ASSESSMENT/ PLAN:  TODAY  Aortic arch atherosclerosis: (cxr 04-13-09): is on statin   2. Post menopausal osteopenia: t score -2.561 will monitor   3. 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   PREVIOUS   4. Stage 3  chronic kidney disease due to type 2 diabetes mellitus: bun 59; creat 1.82; gfr 28 will continue farxiga 5 mg daily   5. Hyperlipidemia associated with type 2 diabetes mellitus: ldl 50 will continue lipitor 20 mg daily   6. Anemia due to stage 3 chronic kidney disease: hgb 8.6; will continue iron twice weekly   7. Increased intraocular pressure bilateral: will continue cosopt and xalatan to both eyes  8. Dementia without behavioral disturbance: weight is 198 pounds.   9. Protein calorie malnutrition protein 5.5; albumin 2.8; will continue supplements as directed  10. Chronic depression: is off remeron due to history of falls  11. Hypertension associated with stage 3a chronic kidney disease due to type 2 diabetes mellitus: b/p 138/76; due to her renal failure will continue  norvasc 2.5 mg daily    Will check cbc; cmp; hgb A1c tsh   Synthia Innocent NP [facility] Adult Medicine  call [phone]

## 2022-12-15 ENCOUNTER — Other Ambulatory Visit (HOSPITAL_COMMUNITY)
Admission: RE | Admit: 2022-12-15 | Discharge: 2022-12-15 | Disposition: A | Discharge status: Home / Self Care | Payer: Medicare Other | Source: Skilled Nursing Facility | Attending: Adult Health | Admitting: Adult Health

## 2022-12-15 DIAGNOSIS — I1310 Hypertensive heart and chronic kidney disease without heart failure, with stage 1 through stage 4 chronic kidney disease, or unspecified chronic kidney disease: Secondary | ICD-10-CM | POA: Insufficient documentation

## 2022-12-15 DIAGNOSIS — E039 Hypothyroidism, unspecified: Secondary | ICD-10-CM | POA: Insufficient documentation

## 2022-12-15 DIAGNOSIS — E1122 Type 2 diabetes mellitus with diabetic chronic kidney disease: Secondary | ICD-10-CM | POA: Insufficient documentation

## 2022-12-15 DIAGNOSIS — N189 Chronic kidney disease, unspecified: Secondary | ICD-10-CM | POA: Diagnosis not present

## 2022-12-15 LAB — COMPREHENSIVE METABOLIC PANEL
ALT: 24 U/L (ref 0–44)
AST: 25 U/L (ref 15–41)
Albumin: 3.2 g/dL — ABNORMAL LOW (ref 3.5–5.0)
Alkaline Phosphatase: 65 U/L (ref 38–126)
Anion gap: 10 (ref 5–15)
BUN: 53 mg/dL — ABNORMAL HIGH (ref 8–23)
CO2: 24 mmol/L (ref 22–32)
Calcium: 8.5 mg/dL — ABNORMAL LOW (ref 8.9–10.3)
Chloride: 105 mmol/L (ref 98–111)
Creatinine, Ser: 1.74 mg/dL — ABNORMAL HIGH (ref 0.44–1.00)
GFR, Estimated: 29 mL/min — ABNORMAL LOW (ref >60)
Glucose, Bld: 75 mg/dL (ref 70–99)
Potassium: 3.8 mmol/L (ref 3.5–5.1)
Sodium: 139 mmol/L (ref 135–145)
Total Bilirubin: 0.5 mg/dL (ref 0.3–1.2)
Total Protein: 6.3 g/dL — ABNORMAL LOW (ref 6.5–8.1)

## 2022-12-15 LAB — CBC
HCT: 36.6 % (ref 36.0–46.0)
Hemoglobin: 11.5 g/dL — ABNORMAL LOW (ref 12.0–15.0)
MCH: 29.6 pg (ref 26.0–34.0)
MCHC: 31.4 g/dL (ref 30.0–36.0)
MCV: 94.1 fL (ref 80.0–100.0)
Platelets: 194 10*3/uL (ref 150–400)
RBC: 3.89 MIL/uL (ref 3.87–5.11)
RDW: 15.6 % — ABNORMAL HIGH (ref 11.5–15.5)
WBC: 5.8 10*3/uL (ref 4.0–10.5)
nRBC: 0 % (ref 0.0–0.2)

## 2022-12-15 LAB — HEMOGLOBIN A1C
Hgb A1c MFr Bld: 7 % — ABNORMAL HIGH (ref 4.8–5.6)
Mean Plasma Glucose: 154.2 mg/dL

## 2022-12-15 LAB — TSH: TSH: 2.108 u[IU]/mL (ref 0.350–4.500)

## 2022-12-16 DIAGNOSIS — Z23 Encounter for immunization: Secondary | ICD-10-CM | POA: Diagnosis not present

## 2022-12-16 DIAGNOSIS — I1310 Hypertensive heart and chronic kidney disease without heart failure, with stage 1 through stage 4 chronic kidney disease, or unspecified chronic kidney disease: Secondary | ICD-10-CM | POA: Diagnosis not present

## 2022-12-29 DIAGNOSIS — I1310 Hypertensive heart and chronic kidney disease without heart failure, with stage 1 through stage 4 chronic kidney disease, or unspecified chronic kidney disease: Secondary | ICD-10-CM | POA: Diagnosis not present

## 2022-12-29 DIAGNOSIS — Z23 Encounter for immunization: Secondary | ICD-10-CM | POA: Diagnosis not present

## 2023-01-12 ENCOUNTER — Non-Acute Institutional Stay (SKILLED_NURSING_FACILITY): Payer: Self-pay | Admitting: Internal Medicine

## 2023-01-12 ENCOUNTER — Encounter: Payer: Self-pay | Admitting: Internal Medicine

## 2023-01-12 DIAGNOSIS — R7989 Other specified abnormal findings of blood chemistry: Secondary | ICD-10-CM

## 2023-01-12 DIAGNOSIS — Z794 Long term (current) use of insulin: Secondary | ICD-10-CM

## 2023-01-12 DIAGNOSIS — I10 Essential (primary) hypertension: Secondary | ICD-10-CM | POA: Diagnosis not present

## 2023-01-12 DIAGNOSIS — E1122 Type 2 diabetes mellitus with diabetic chronic kidney disease: Secondary | ICD-10-CM | POA: Diagnosis not present

## 2023-01-12 DIAGNOSIS — I700 Atherosclerosis of aorta: Secondary | ICD-10-CM | POA: Diagnosis not present

## 2023-01-12 DIAGNOSIS — F339 Major depressive disorder, recurrent, unspecified: Secondary | ICD-10-CM

## 2023-01-12 DIAGNOSIS — F0390 Unspecified dementia without behavioral disturbance: Secondary | ICD-10-CM

## 2023-01-12 DIAGNOSIS — N1832 Chronic kidney disease, stage 3b: Secondary | ICD-10-CM

## 2023-01-12 NOTE — Patient Instructions (Signed)
See assessment and plan under each diagnosis in the problem list and acutely for this visit 

## 2023-01-12 NOTE — Progress Notes (Unsigned)
   NURSING HOME LOCATION:  [facility] ROOM NUMBER:  101  CODE STATUS:  DNR  PCP:  Synthia Innocent NP  This is a nursing facility follow up visit of chronic medical diagnoses & to document compliance with Regulation 483.30 (c) in The Long Term Care Survey Manual Phase 2 which mandates caregiver visit ( visits can alternate among physician, PA or NP as per statutes) within 10 days of 30 days / 60 days/ 90 days post admission to SNF date    Interim medical record and care since last SNF visit was updated with review of diagnostic studies and change in clinical status since last visit were documented.  HPI:  Review of systems: Dementia invalidated responses. Date given as   Constitutional: No fever, significant weight change, fatigue  Eyes: No redness, discharge, pain, vision change ENT/mouth: No nasal congestion,  purulent discharge, earache, change in hearing, sore throat  Cardiovascular: No chest pain, palpitations, paroxysmal nocturnal dyspnea, claudication, edema  Respiratory: No cough, sputum production, hemoptysis, DOE, significant snoring, apnea   Gastrointestinal: No heartburn, dysphagia, abdominal pain, nausea /vomiting, rectal bleeding, melena, change in bowels Genitourinary: No dysuria, hematuria, pyuria, incontinence, nocturia Musculoskeletal: No joint stiffness, joint swelling, weakness, pain Dermatologic: No rash, pruritus, change in appearance of skin Neurologic: No dizziness, headache, syncope, seizures, numbness, tingling Psychiatric: No significant anxiety, depression, insomnia, anorexia Endocrine: No change in hair/skin/nails, excessive thirst, excessive hunger, excessive urination  Hematologic/lymphatic: No significant bruising, lymphadenopathy, abnormal bleeding Allergy/immunology: No itchy/watery eyes, significant sneezing, urticaria, angioedema  Physical exam:  Pertinent or positive findings: General appearance: Adequately nourished; no acute  distress, increased work of breathing is present.   Lymphatic: No lymphadenopathy about the head, neck, axilla. Eyes: No conjunctival inflammation or lid edema is present. There is no scleral icterus. Ears:  External ear exam shows no significant lesions or deformities.   Nose:  External nasal examination shows no deformity or inflammation. Nasal mucosa are pink and moist without lesions, exudates Oral exam:  Lips and gums are healthy appearing. There is no oropharyngeal erythema or exudate. Neck:  No thyromegaly, masses, tenderness noted.    Heart:  Normal rate and regular rhythm. S1 and S2 normal without gallop, murmur, click, rub .  Lungs: Chest clear to auscultation without wheezes, rhonchi, rales, rubs. Abdomen: Bowel sounds are normal. Abdomen is soft and nontender with no organomegaly, hernias, masses. GU: Deferred  Extremities:  No cyanosis, clubbing, edema  Neurologic exam : Cn 2-7 intact Strength equal  in upper & lower extremities Balance, Rhomberg, finger to nose testing could not be completed due to clinical state Deep tendon reflexes are equal Skin: Warm & dry w/o tenting. No significant lesions or rash.  See summary under each active problem in the Problem List with associated updated therapeutic plan

## 2023-01-13 ENCOUNTER — Non-Acute Institutional Stay (SKILLED_NURSING_FACILITY): Payer: Self-pay | Admitting: Adult Health

## 2023-01-13 DIAGNOSIS — E1122 Type 2 diabetes mellitus with diabetic chronic kidney disease: Secondary | ICD-10-CM

## 2023-01-13 DIAGNOSIS — Z794 Long term (current) use of insulin: Secondary | ICD-10-CM | POA: Diagnosis not present

## 2023-01-13 DIAGNOSIS — N1832 Chronic kidney disease, stage 3b: Secondary | ICD-10-CM

## 2023-01-13 DIAGNOSIS — N1831 Chronic kidney disease, stage 3a: Secondary | ICD-10-CM

## 2023-01-13 DIAGNOSIS — I129 Hypertensive chronic kidney disease with stage 1 through stage 4 chronic kidney disease, or unspecified chronic kidney disease: Secondary | ICD-10-CM

## 2023-01-13 NOTE — Assessment & Plan Note (Signed)
12/15/2022 stable CKD stage IV is now present with a current creatinine of 1.74 and GFR 29.  A1c has improved dramatically from 8.8% to 7%.  No change indicated.

## 2023-01-13 NOTE — Assessment & Plan Note (Signed)
She denies any active cardiopulmonary symptoms except for occasional nonproductive cough.  She does exhibit peripheral edema without signs of cardiac decompensation.

## 2023-01-13 NOTE — Assessment & Plan Note (Signed)
She can provide no meaningful history and tends to confabulate.  No behavioral issues have been reported by staff.  No change indicated.

## 2023-01-13 NOTE — Assessment & Plan Note (Signed)
Current TSH is therapeutic at 2.108.  No change indicated.  Continue periodic monitor.

## 2023-01-13 NOTE — Assessment & Plan Note (Signed)
Systolic blood pressure is mildly elevated on low-dose amlodipine.  I will discuss possibly changing the amlodipine to another antihypertensive carvedilol with titration as needed as the amlodipine may be contributing to her peripheral edema despite being on low-dose.

## 2023-01-13 NOTE — Assessment & Plan Note (Signed)
Clinically she is pleasantly demented without suggestion of active depression.

## 2023-01-14 ENCOUNTER — Encounter: Payer: Self-pay | Admitting: Adult Health

## 2023-01-14 NOTE — Progress Notes (Unsigned)
Location:  [facility] Room Number: 101 Place of Service:  SNF (31)   CODE STATUS: dnr   No Known Allergies  Chief Complaint  Patient presents with   Acute Visit    Weight gain     HPI:  She has been gaining weight up 20 pounds in the past 6 months with 15 pounds in the past month. Her current weight is 208.8 pounds. She does have pedal edema, is on norvasc. She has a history of ckd stage 3b. Her hgb A1c is  7.0; her cbg readings are soft and mainly less than 150. There are no reports of coughing or shortness of breath. She has a good appetite; and is eating snacks as well.   Past Medical History:  Diagnosis Date   CAD (coronary artery disease)    DM (diabetes mellitus)  with CKD    GFR 30   Glaucoma    HTN (hypertension)    Obesity    Osteoarthritis     Past Surgical History:  Procedure Laterality Date   CHOLECYSTECTOMY     CORONARY STENT PLACEMENT     x3   REFRACTIVE SURGERY     detached retina   TUBAL LIGATION     77 yrs old    Social History   Socioeconomic History   Marital status: Divorced    Spouse name: Not on file   Number of children: 2   Years of education: Not on file   Highest education level: Not on file  Occupational History   Occupation: retired  Tobacco Use   Smoking status: Every Day    Current packs/day: 1.00    Types: Cigarettes   Smokeless tobacco: Not on file  Vaping Use   Vaping status: Not on file  Substance and Sexual Activity   Alcohol use: Yes    Comment: rarely   Drug use: Never   Sexual activity: Not on file  Other Topics Concern   Not on file  Social History Narrative   Not on file   Social Determinants of Health   Financial Resource Strain: Unknown (05/03/2021)   Received from [facility] [facility], [facility] [place]   Overall Financial Resource Strain (CARDIA)    Difficulty of Paying Living Expenses: Patient declined  Food Insecurity: Unknown (05/03/2021)   Received from  [facility] [place], [facility] [place]   Hunger Vital Sign    Worried About Running Out of Food in the Last Year: Patient declined    Barista in the Last Year: Patient declined  Transportation Needs: Unknown (05/03/2021)   Received from [facility] [employer], [facility] [place]   [employer] - Administrator, Civil Service (Medical): Patient declined    Lack of Transportation (Non-Medical): Patient declined  Physical Activity: Not on file  Stress: Not on file  Social Connections: Unknown (05/06/2022)   Received from [facility], [facility]   Social Network    Social Network: Not on file  Intimate Partner Violence: Unknown (05/06/2022)   Received from [facility], [facility]   HITS    Physically Hurt: Not on file    Insult or Talk Down To: Not on file    Threaten Physical Harm: Not on file    Scream or Curse: Not on file   Family History  Problem Relation Age of Onset   Stroke Father    Diabetes Sister  Hypertension Sister    Arthritis Other    Heart disease Other    Hypertension Other    Stroke Other    Kidney disease Other    Diabetes Other       VITAL SIGNS BP 118/70   Pulse 74   Temp 97.8 F (36.6 C)   Resp (!) 22   Ht 5\' 4"  (1.626 m)   Wt 208 lb 12.8 oz (94.7 kg)   SpO2 98%   BMI 35.84 kg/m   Outpatient Encounter Medications as of 01/13/2023  Medication Sig   acetaminophen (TYLENOL) 650 MG CR tablet Take 650 mg by mouth 3 (three) times daily.   Amino Acids-Protein Hydrolys (FEEDING SUPPLEMENT, PRO-STAT SUGAR FREE 64,) LIQD Take 30 mLs by mouth 2 (two) times daily.   amLODipine (NORVASC) 2.5 MG tablet Take 2.5 mg by mouth daily.   aspirin 81 MG tablet Take 81 mg by mouth daily.   atorvastatin (LIPITOR) 20 MG tablet Take 20 mg by mouth daily.   calcium-vitamin D (OSCAL WITH D) 500-5 MG-MCG tablet Take 1 tablet by mouth daily.   clobetasol cream (TEMOVATE) 0.05 % Apply 1 Application topically 2 (two)  times a week. Apply to labia/vaginal area twice a week. Once A Day on Mon, Fri   dapagliflozin propanediol (FARXIGA) 5 MG TABS tablet Take 5 mg by mouth daily.   dorzolamide-timolol (COSOPT) 22.3-6.8 MG/ML ophthalmic solution Place 1 drop into both eyes daily.  Wait at least 5 minutes between multiple drops in same eye.   ferrous sulfate 325 (65 FE) MG tablet Take 325 mg by mouth as directed. On Monday and Thursday   furosemide (LASIX) 20 MG tablet Take 20 mg by mouth daily.   Insulin Glargine (BASAGLAR KWIKPEN) 100 UNIT/ML Inject 30 Units into the skin at bedtime.   insulin lispro (HUMALOG KWIKPEN) 100 UNIT/ML KwikPen Inject 12 Units into the skin with breakfast, with lunch, and with evening meal.   latanoprost (XALATAN) 0.005 % ophthalmic solution Place 1 drop into both eyes daily.   loratadine (CLARITIN) 10 MG tablet Take 1 tablet (10 mg total) by mouth daily as needed for itching or rhinitis.   NON FORMULARY Diet:Regular   PRESCRIPTION MEDICATION Endit Skin Protectant Ointment; topical Special Instructions: Apply to vaginal/perineal area bid. Twice A Day   No facility-administered encounter medications on file as of 01/13/2023.     SIGNIFICANT DIAGNOSTIC EXAMS  07-25-21: dexa: t score: -2.521  NO NEW EXAMS   PREVIOUS   01-09-22: wbc 10.0; hgb 10.2; hct 30.1; mcv 93.5 plt 209; glucose 256; bun 44; creat 1.74; k+ 4.4; na++ 135; ca 8.5; gfr 30; protein 6.2; albumin 3.0  04-14-22: hgb A1c 7.0; ACR 134; micro-albumin 140.6 04-28-22: tsh 0.393   05-01-22: wbc 7.5; hgb 9.5; hct 29.7; mcv 97.1 plt 242; glucose 126; bun 42; creat 1.68; k+ 4.4; na++ 139; ca 8.0; gfr 31; protein 5.5 albumin 2.8  06-05-22: glucose 113; bun 59; creat 1.82; k+ 4.0; na++ 138; ca 8.2; gfr 28 08-14-22: albumin 3.1  09-11-22: hgb A1c 8.8   TODAY  12-15-22: wbc 5.8; hgb 11.5; hct 36.6; mcv 94.1 plt 194; glucose 75; bun 53; creat 1.74; k+ 3.8; na++ 139; ca 8.5 gfr 29 protein 6.3 albumin 3.2 tsh 2.108 hgb A1c 7.0       Review of Systems  Constitutional:  Negative for malaise/fatigue.  Respiratory:  Negative for cough and shortness of breath.   Cardiovascular:  Positive for leg swelling. Negative for chest pain and palpitations.  Gastrointestinal:  Negative for abdominal pain, constipation and heartburn.  Musculoskeletal:  Negative for back pain, joint pain and myalgias.  Skin: Negative.   Neurological:  Negative for dizziness.  Psychiatric/Behavioral:  The patient is not nervous/anxious.    Physical Exam Constitutional:      General: She is not in acute distress.    Appearance: She is well-developed. She is obese. She is not diaphoretic.  Neck:     Thyroid: No thyromegaly.  Cardiovascular:     Rate and Rhythm: Normal rate and regular rhythm.     Pulses: Normal pulses.     Heart sounds: Normal heart sounds.  Pulmonary:     Effort: Pulmonary effort is normal. No respiratory distress.     Breath sounds: Normal breath sounds.  Abdominal:     General: Bowel sounds are normal. There is no distension.     Palpations: Abdomen is soft.     Tenderness: There is no abdominal tenderness.  Musculoskeletal:        General: Normal range of motion.     Cervical back: Neck supple.     Right lower leg: Edema present.     Left lower leg: Edema present.  Lymphadenopathy:     Cervical: No cervical adenopathy.  Skin:    General: Skin is warm and dry.  Neurological:     Mental Status: She is alert. Mental status is at baseline.  Psychiatric:        Mood and Affect: Mood normal.      ASSESSMENT/ PLAN:  TODAY  Type 2 diabetes mellitus with stage 3b chronic kidney disease with long term current use of insulin: her hgb A1c is 7.0  will lower her basaglar to 26 units; and will lower humalog to 9 units with meals  2. Hypertension associated with stage 3b chronic kidney disease due to type 2 diabetes mellitus: will stop norvasc due to edema; will check blood pressure daily   Synthia Innocent NP [facility]  Adult Medicine   call [phone]

## 2023-01-16 DIAGNOSIS — Z7984 Long term (current) use of oral hypoglycemic drugs: Secondary | ICD-10-CM | POA: Diagnosis not present

## 2023-01-16 DIAGNOSIS — E1151 Type 2 diabetes mellitus with diabetic peripheral angiopathy without gangrene: Secondary | ICD-10-CM | POA: Diagnosis not present

## 2023-01-16 DIAGNOSIS — L602 Onychogryphosis: Secondary | ICD-10-CM | POA: Diagnosis not present

## 2023-01-16 DIAGNOSIS — L603 Nail dystrophy: Secondary | ICD-10-CM | POA: Diagnosis not present

## 2023-02-03 ENCOUNTER — Encounter: Payer: Self-pay | Admitting: Adult Health

## 2023-02-03 NOTE — Progress Notes (Unsigned)
Location:  [facility] Room Number: 101 Place of Service:  SNF (31)   CODE STATUS: ***  No Known Allergies  Chief Complaint  Patient presents with   Acute Visit    Care plan meeting    HPI:    Past Medical History:  Diagnosis Date   CAD (coronary artery disease)    DM (diabetes mellitus)  with CKD    GFR 30   Glaucoma    HTN (hypertension)    Obesity    Osteoarthritis     Past Surgical History:  Procedure Laterality Date   CHOLECYSTECTOMY     CORONARY STENT PLACEMENT     x3   REFRACTIVE SURGERY     detached retina   TUBAL LIGATION     77 yrs old    Social History   Socioeconomic History   Marital status: Divorced    Spouse name: Not on file   Number of children: 2   Years of education: Not on file   Highest education level: Not on file  Occupational History   Occupation: retired  Tobacco Use   Smoking status: Every Day    Current packs/day: 1.00    Types: Cigarettes   Smokeless tobacco: Not on file  Vaping Use   Vaping status: Not on file  Substance and Sexual Activity   Alcohol use: Yes    Comment: rarely   Drug use: Never   Sexual activity: Not on file  Other Topics Concern   Not on file  Social History Narrative   Not on file   Social Determinants of Health   Financial Resource Strain: Unknown (05/03/2021)   Received from [facility] [employer], [facility] [place]   Overall Financial Resource Strain (CARDIA)    Difficulty of Paying Living Expenses: Patient declined  Food Insecurity: Unknown (05/03/2021)   Received from [facility] [place], [facility] [place]   Hunger Vital Sign    Worried About Running Out of Food in the Last Year: Patient declined    Barista in the Last Year: Patient declined  Transportation Needs: Unknown (05/03/2021)   Received from [facility] [employer], [facility] [place]   [employer] - Administrator, Civil Service (Medical):  Patient declined    Lack of Transportation (Non-Medical): Patient declined  Physical Activity: Not on file  Stress: Not on file  Social Connections: Unknown (05/06/2022)   Received from [facility], [facility]   Social Network    Social Network: Not on file  Intimate Partner Violence: Unknown (05/06/2022)   Received from [facility], [facility]   HITS    Physically Hurt: Not on file    Insult or Talk Down To: Not on file    Threaten Physical Harm: Not on file    Scream or Curse: Not on file   Family History  Problem Relation Age of Onset   Stroke Father    Diabetes Sister    Hypertension Sister    Arthritis Other    Heart disease Other    Hypertension Other    Stroke Other    Kidney disease Other    Diabetes Other       VITAL SIGNS BP (!) 122/45   Pulse 69   Temp 97.8 F (36.6 C)   Resp 20   Ht 5\' 4"  (1.626 m)   Wt 208 lb 12.8 oz (94.7 kg)  SpO2 94%   BMI 35.84 kg/m   Outpatient Encounter Medications as of 02/04/2023  Medication Sig   acetaminophen (TYLENOL) 650 MG CR tablet Take 650 mg by mouth 3 (three) times daily.   Amino Acids-Protein Hydrolys (FEEDING SUPPLEMENT, PRO-STAT SUGAR FREE 64,) LIQD Take 30 mLs by mouth 2 (two) times daily.   aspirin 81 MG tablet Take 81 mg by mouth daily.   atorvastatin (LIPITOR) 20 MG tablet Take 20 mg by mouth daily.   calcium-vitamin D (OSCAL WITH D) 500-5 MG-MCG tablet Take 1 tablet by mouth daily.   clobetasol cream (TEMOVATE) 0.05 % Apply 1 Application topically 2 (two) times a week. Apply to labia/vaginal area twice a week. Once A Day on Mon, Fri   dapagliflozin propanediol (FARXIGA) 5 MG TABS tablet Take 5 mg by mouth daily.   dorzolamide-timolol (COSOPT) 22.3-6.8 MG/ML ophthalmic solution Place 1 drop into both eyes daily.  Wait at least 5 minutes between multiple drops in same eye.   ferrous sulfate 325 (65 FE) MG tablet Take 325 mg by mouth as directed. On Monday and Thursday   furosemide (LASIX) 20 MG tablet  Take 20 mg by mouth daily.   Insulin Glargine (BASAGLAR KWIKPEN) 100 UNIT/ML Inject 26 Units into the skin at bedtime.   insulin lispro (HUMALOG KWIKPEN) 100 UNIT/ML KwikPen Inject 9 Units into the skin with breakfast, with lunch, and with evening meal.   latanoprost (XALATAN) 0.005 % ophthalmic solution Place 1 drop into both eyes daily.   loratadine (CLARITIN) 10 MG tablet Take 1 tablet (10 mg total) by mouth daily as needed for itching or rhinitis.   NON FORMULARY Diet:Regular   PRESCRIPTION MEDICATION Endit Skin Protectant Ointment; topical Special Instructions: Apply to vaginal/perineal area bid. Twice A Day   No facility-administered encounter medications on file as of 02/04/2023.     SIGNIFICANT DIAGNOSTIC EXAMS       ASSESSMENT/ PLAN:     Synthia Innocent NP [facility] Adult Medicine  call [phone]

## 2023-02-04 ENCOUNTER — Encounter: Payer: Self-pay | Admitting: Adult Health

## 2023-02-04 ENCOUNTER — Non-Acute Institutional Stay (SKILLED_NURSING_FACILITY): Payer: Medicare Other | Admitting: Adult Health

## 2023-02-04 DIAGNOSIS — F339 Major depressive disorder, recurrent, unspecified: Secondary | ICD-10-CM | POA: Diagnosis not present

## 2023-02-04 DIAGNOSIS — F0390 Unspecified dementia without behavioral disturbance: Secondary | ICD-10-CM | POA: Diagnosis not present

## 2023-02-04 DIAGNOSIS — I700 Atherosclerosis of aorta: Secondary | ICD-10-CM | POA: Diagnosis not present

## 2023-02-04 NOTE — Progress Notes (Signed)
Location:  [facility] Room Number: 101 Place of Service:  SNF (31)   CODE STATUS: dnr   No Known Allergies  Chief Complaint  Patient presents with   Acute Visit    Care plan meeting.     HPI:  We have come together for her care plan meeting. Family present  BIMS 5/15 mood 3/30: decreased energy. Uses wheelchair without falls. She requires dependent assist with her adl care. She is incontinent of bladder and bowel. Dietary regular diet; feeds self; weight is 208.8 pounds; good appetite; is on prosource. Therapy: none at this time. She will continue to be followed for her chronic illnesses including:  Aortic arch atherosclerosis  Dementia without behavioral disturbance   Depression, recurrent   Past Medical History:  Diagnosis Date   CAD (coronary artery disease)    DM (diabetes mellitus)  with CKD    GFR 30   Glaucoma    HTN (hypertension)    Obesity    Osteoarthritis     Past Surgical History:  Procedure Laterality Date   CHOLECYSTECTOMY     CORONARY STENT PLACEMENT     x3   REFRACTIVE SURGERY     detached retina   TUBAL LIGATION     77 yrs old    Social History   Socioeconomic History   Marital status: Divorced    Spouse name: Not on file   Number of children: 2   Years of education: Not on file   Highest education level: Not on file  Occupational History   Occupation: retired  Tobacco Use   Smoking status: Every Day    Current packs/day: 1.00    Types: Cigarettes   Smokeless tobacco: Not on file  Vaping Use   Vaping status: Not on file  Substance and Sexual Activity   Alcohol use: Yes    Comment: rarely   Drug use: Never   Sexual activity: Not on file  Other Topics Concern   Not on file  Social History Narrative   Not on file   Social Determinants of Health   Financial Resource Strain: Unknown (05/03/2021)   Received from [facility] [employer], [facility] [place]   Overall Financial Resource Strain  (CARDIA)    Difficulty of Paying Living Expenses: Patient declined  Food Insecurity: Unknown (05/03/2021)   Received from [facility] [place], [facility] [place]   Hunger Vital Sign    Worried About Running Out of Food in the Last Year: Patient declined    Barista in the Last Year: Patient declined  Transportation Needs: Unknown (05/03/2021)   Received from [facility] [employer], [facility] [place]   [employer] - Administrator, Civil Service (Medical): Patient declined    Lack of Transportation (Non-Medical): Patient declined  Physical Activity: Not on file  Stress: Not on file  Social Connections: Unknown (05/06/2022)   Received from [facility], [facility]   Social Network    Social Network: Not on file  Intimate Partner Violence: Unknown (05/06/2022)   Received from [facility], [facility]   HITS    Physically Hurt: Not on file    Insult or Talk Down To: Not on file    Threaten Physical Harm: Not on file    Scream or Curse: Not on file   Family History  Problem Relation Age of Onset   Stroke Father  Diabetes Sister    Hypertension Sister    Arthritis Other    Heart disease Other    Hypertension Other    Stroke Other    Kidney disease Other    Diabetes Other       VITAL SIGNS BP (!) 122/45   Pulse 69   Temp 97.8 F (36.6 C)   Resp 20   Ht 5\' 4"  (1.626 m)   Wt 208 lb 12.8 oz (94.7 kg)   SpO2 94%   BMI 35.84 kg/m   Outpatient Encounter Medications as of 02/04/2023  Medication Sig   acetaminophen (TYLENOL) 650 MG CR tablet Take 650 mg by mouth 3 (three) times daily.   Amino Acids-Protein Hydrolys (FEEDING SUPPLEMENT, PRO-STAT SUGAR FREE 64,) LIQD Take 30 mLs by mouth 2 (two) times daily.   aspirin 81 MG tablet Take 81 mg by mouth daily.   atorvastatin (LIPITOR) 20 MG tablet Take 20 mg by mouth daily.   calcium-vitamin D (OSCAL WITH D) 500-5 MG-MCG tablet Take 1 tablet by mouth daily.   clobetasol cream  (TEMOVATE) 0.05 % Apply 1 Application topically 2 (two) times a week. Apply to labia/vaginal area twice a week. Once A Day on Mon, Fri   dapagliflozin propanediol (FARXIGA) 5 MG TABS tablet Take 5 mg by mouth daily.   dorzolamide-timolol (COSOPT) 22.3-6.8 MG/ML ophthalmic solution Place 1 drop into both eyes daily.  Wait at least 5 minutes between multiple drops in same eye.   ferrous sulfate 325 (65 FE) MG tablet Take 325 mg by mouth as directed. On Monday and Thursday   furosemide (LASIX) 20 MG tablet Take 20 mg by mouth daily.   Insulin Glargine (BASAGLAR KWIKPEN) 100 UNIT/ML Inject 26 Units into the skin at bedtime.   insulin lispro (HUMALOG KWIKPEN) 100 UNIT/ML KwikPen Inject 9 Units into the skin with breakfast, with lunch, and with evening meal.   latanoprost (XALATAN) 0.005 % ophthalmic solution Place 1 drop into both eyes daily.   loratadine (CLARITIN) 10 MG tablet Take 1 tablet (10 mg total) by mouth daily as needed for itching or rhinitis.   NON FORMULARY Diet:Regular   PRESCRIPTION MEDICATION Endit Skin Protectant Ointment; topical Special Instructions: Apply to vaginal/perineal area bid. Twice A Day   No facility-administered encounter medications on file as of 02/04/2023.     SIGNIFICANT DIAGNOSTIC EXAMS  07-25-21: dexa: t score: -2.521  NO NEW EXAMS   PREVIOUS   01-09-22: wbc 10.0; hgb 10.2; hct 30.1; mcv 93.5 plt 209; glucose 256; bun 44; creat 1.74; k+ 4.4; na++ 135; ca 8.5; gfr 30; protein 6.2; albumin 3.0  04-14-22: hgb A1c 7.0; ACR 134; micro-albumin 140.6 04-28-22: tsh 0.393   05-01-22: wbc 7.5; hgb 9.5; hct 29.7; mcv 97.1 plt 242; glucose 126; bun 42; creat 1.68; k+ 4.4; na++ 139; ca 8.0; gfr 31; protein 5.5 albumin 2.8  06-05-22: glucose 113; bun 59; creat 1.82; k+ 4.0; na++ 138; ca 8.2; gfr 28 08-14-22: albumin 3.1  09-11-22: hgb A1c 8.8  12-15-22: wbc 5.8; hgb 11.5; hct 36.6; mcv 94.1 plt 194; glucose 75; bun 53; creat 1.74; k+ 3.8; na++ 139; ca 8.5 gfr 29 protein 6.3  albumin 3.2 tsh 2.108 hgb A1c 7.0     NO NEW LABS.    Review of Systems  Constitutional:  Negative for malaise/fatigue.  Respiratory:  Negative for cough and shortness of breath.   Cardiovascular:  Negative for chest pain, palpitations and leg swelling.  Gastrointestinal:  Negative for abdominal pain, constipation and  heartburn.  Musculoskeletal:  Negative for back pain, joint pain and myalgias.  Skin: Negative.   Neurological:  Negative for dizziness.  Psychiatric/Behavioral:  The patient is not nervous/anxious.    Physical Exam Constitutional:      General: She is not in acute distress.    Appearance: She is well-developed. She is obese. She is not diaphoretic.  Neck:     Thyroid: No thyromegaly.  Cardiovascular:     Rate and Rhythm: Normal rate and regular rhythm.     Pulses: Normal pulses.     Heart sounds: Normal heart sounds.  Pulmonary:     Effort: Pulmonary effort is normal. No respiratory distress.     Breath sounds: Normal breath sounds.  Abdominal:     General: Bowel sounds are normal. There is no distension.     Palpations: Abdomen is soft.     Tenderness: There is no abdominal tenderness.  Musculoskeletal:        General: Normal range of motion.     Cervical back: Neck supple.     Right lower leg: No edema.     Left lower leg: No edema.  Lymphadenopathy:     Cervical: No cervical adenopathy.  Skin:    General: Skin is warm and dry.  Neurological:     Mental Status: She is alert. Mental status is at baseline.  Psychiatric:        Mood and Affect: Mood normal.     ASSESSMENT/ PLAN:  TODAY  Aortic arch atherosclerosis Dementia without behavioral disturbance Depression, recurrent  Will continue current medications Will continue current plan of care Will continue to monitor her status.  Time spent with patient: 40 minutes: medications; plan of care dietary.    Synthia Innocent NP [facility]  call [phone] \

## 2023-02-10 ENCOUNTER — Encounter: Payer: Self-pay | Admitting: Adult Health

## 2023-02-10 ENCOUNTER — Non-Acute Institutional Stay (SKILLED_NURSING_FACILITY): Payer: Medicare Other | Admitting: Adult Health

## 2023-02-10 DIAGNOSIS — N1832 Chronic kidney disease, stage 3b: Secondary | ICD-10-CM

## 2023-02-10 DIAGNOSIS — E1122 Type 2 diabetes mellitus with diabetic chronic kidney disease: Secondary | ICD-10-CM

## 2023-02-10 DIAGNOSIS — E1169 Type 2 diabetes mellitus with other specified complication: Secondary | ICD-10-CM

## 2023-02-10 DIAGNOSIS — E785 Hyperlipidemia, unspecified: Secondary | ICD-10-CM | POA: Diagnosis not present

## 2023-02-10 DIAGNOSIS — N183 Type 2 diabetes mellitus with diabetic chronic kidney disease: Secondary | ICD-10-CM

## 2023-02-10 DIAGNOSIS — Z794 Long term (current) use of insulin: Secondary | ICD-10-CM

## 2023-02-10 DIAGNOSIS — D631 Anemia in chronic kidney disease: Secondary | ICD-10-CM

## 2023-02-10 NOTE — Progress Notes (Unsigned)
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 There are no reports of uncontrolled pain. She has had some low cbg readings in the AM will need her insulins adjusted.   Past Medical History:  Diagnosis Date   CAD (coronary artery disease)    DM (diabetes mellitus)  with CKD    GFR 30   Glaucoma    HTN (hypertension)    Obesity    Osteoarthritis     Past Surgical History:  Procedure Laterality Date   CHOLECYSTECTOMY     CORONARY STENT PLACEMENT     x3   REFRACTIVE SURGERY     detached retina   TUBAL LIGATION     77 yrs old    Social History   Socioeconomic History   Marital status: Divorced    Spouse name: Not on file   Number of children: 2   Years of education: Not on file   Highest education level: Not on file  Occupational History   Occupation: retired  Tobacco Use   Smoking status: Every Day    Current packs/day: 1.00    Types: Cigarettes   Smokeless tobacco: Not on file  Vaping Use   Vaping status: Not on file  Substance and Sexual Activity   Alcohol use: Yes    Comment: rarely   Drug use: Never    Sexual activity: Not on file  Other Topics Concern   Not on file  Social History Narrative   Not on file   Social Determinants of Health   Financial Resource Strain: Unknown (05/03/2021)   Received from [facility] [place], [facility] [place]   Overall Financial Resource Strain (CARDIA)    Difficulty of Paying Living Expenses: Patient declined  Food Insecurity: Unknown (05/03/2021)   Received from [facility] [place], [facility] [place]   Hunger Vital Sign    Worried About Running Out of Food in the Last Year: Patient declined    Barista in the Last Year: Patient declined  Transportation Needs: Unknown (05/03/2021)   Received from [facility] [employer], [facility] [place]   [employer] - Administrator, Civil Service (Medical): Patient declined    Lack of Transportation (Non-Medical): Patient declined  Physical Activity: Not on file  Stress: Not on file  Social Connections: Unknown (05/06/2022)   Received from [facility], [facility]   Social Network    Social Network: Not on file  Intimate  Partner Violence: Unknown (05/06/2022)   Received from [facility], [facility]   HITS    Physically Hurt: Not on file    Insult or Talk Down To: Not on file    Threaten Physical Harm: Not on file    Scream or Curse: Not on file   Family History  Problem Relation Age of Onset   Stroke Father    Diabetes Sister    Hypertension Sister    Arthritis Other    Heart disease Other    Hypertension Other    Stroke Other    Kidney disease Other    Diabetes Other       VITAL SIGNS BP 132/70   Pulse 74   Temp 98.1 F (36.7 C)   Resp 20   Ht 5\' 4"  (1.626 m)   Wt 208 lb 12.8 oz (94.7 kg)   SpO2 96%   BMI 35.84 kg/m   Outpatient Encounter Medications as of 02/10/2023  Medication Sig   acetaminophen (TYLENOL) 650 MG CR tablet Take 650 mg by mouth 3 (three) times daily.   Amino Acids-Protein Hydrolys (FEEDING  SUPPLEMENT, PRO-STAT SUGAR FREE 64,) LIQD Take 30 mLs by mouth 2 (two) times daily.   aspirin 81 MG tablet Take 81 mg by mouth daily.   atorvastatin (LIPITOR) 20 MG tablet Take 20 mg by mouth daily.   calcium-vitamin D (OSCAL WITH D) 500-5 MG-MCG tablet Take 1 tablet by mouth daily.   clobetasol cream (TEMOVATE) 0.05 % Apply 1 Application topically 2 (two) times a week. Apply to labia/vaginal area twice a week. Once A Day on Mon, Fri   dapagliflozin propanediol (FARXIGA) 5 MG TABS tablet Take 5 mg by mouth daily.   dorzolamide-timolol (COSOPT) 22.3-6.8 MG/ML ophthalmic solution Place 1 drop into both eyes daily.  Wait at least 5 minutes between multiple drops in same eye.   ferrous sulfate 325 (65 FE) MG tablet Take 325 mg by mouth as directed. On Monday and Thursday   furosemide (LASIX) 20 MG tablet Take 20 mg by mouth daily.   Insulin Glargine (BASAGLAR KWIKPEN) 100 UNIT/ML Inject 26 Units into the skin at bedtime.   insulin lispro (HUMALOG KWIKPEN) 100 UNIT/ML KwikPen Inject 9 Units into the skin with breakfast, with lunch, and with evening meal.   latanoprost (XALATAN) 0.005 % ophthalmic solution Place 1 drop into both eyes daily.   loratadine (CLARITIN) 10 MG tablet Take 1 tablet (10 mg total) by mouth daily as needed for itching or rhinitis.   NON FORMULARY Diet:Regular   PRESCRIPTION MEDICATION Endit Skin Protectant Ointment; topical Special Instructions: Apply to vaginal/perineal area bid. Twice A Day   No facility-administered encounter medications on file as of 02/10/2023.     SIGNIFICANT DIAGNOSTIC EXAMS  07-25-21: dexa: t score: -2.521  NO NEW EXAMS   PREVIOUS   04-14-22: hgb A1c 7.0; ACR 134; micro-albumin 140.6 04-28-22: tsh 0.393   05-01-22: wbc 7.5; hgb 9.5; hct 29.7; mcv 97.1 plt 242; glucose 126; bun 42; creat 1.68; k+ 4.4; na++ 139; ca 8.0; gfr 31; protein 5.5 albumin 2.8  06-05-22: glucose 113; bun 59; creat 1.82; k+ 4.0; na++ 138; ca 8.2; gfr 28 08-14-22: albumin 3.1   09-11-22: hgb A1c 8.8 chol 124 ldl 56 trig 85 hdl 51  TODAY  12-15-22: wbc 5.8; hgb 11.5; hct 36.6; mcv 94.1 plt 194; glucose 75; bun 53; creat 1.74; k+ 3.8; na++ 139; ca 8.5; gfr 29; hgb A1c 7.0; tsh 2.108     Review of  Systems  Constitutional:  Negative for malaise/fatigue.  Respiratory:  Negative for cough and shortness of breath.   Cardiovascular:  Negative for chest pain, palpitations and leg swelling.  Gastrointestinal:  Negative for abdominal pain, constipation and heartburn.  Musculoskeletal:  Negative for back pain, joint pain and myalgias.  Skin: Negative.   Neurological:  Negative for dizziness.  Psychiatric/Behavioral:  The patient is not nervous/anxious.    Physical Exam Constitutional:      General: She is not in acute distress.    Appearance: She is well-developed. She is obese. She is not diaphoretic.  Neck:     Thyroid: No thyromegaly.  Cardiovascular:     Rate and Rhythm: Normal rate and regular rhythm.     Pulses: Normal pulses.     Heart sounds: Normal heart sounds.  Pulmonary:     Effort: Pulmonary effort is normal. No respiratory distress.     Breath sounds: Normal breath sounds.  Abdominal:     General: Bowel sounds are normal. There is no distension.     Palpations: Abdomen is soft.     Tenderness: There is no abdominal tenderness.  Musculoskeletal:        General: Normal range of motion.     Cervical back: Neck supple.     Right lower leg: No edema.     Left lower leg: No edema.  Lymphadenopathy:     Cervical: No cervical adenopathy.  Skin:    General: Skin is warm and dry.  Neurological:     Mental Status: She is alert. Mental status is at baseline.  Psychiatric:        Mood and Affect: Mood normal.       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   2. Stage 3 chronic kidney disease due to type 2 diabetes mellitus: bun 53; creat 1.74; gfr29 will continue to monitor   3. Hyperlipidemia associated with type 2 diabetes mellitus: ldl 56 will continue lipitor 20 mg daily   4. Anemia due to stage 3 chronic kidney disease: hgb 11.5 will continue iron twice weekly   PREVIOUS   5. Increased intraocular pressure bilateral: will continue cosopt and xalatan to both eyes  6. Dementia without behavioral disturbance: weight is 208 pounds.   7. Protein calorie malnutrition protein 6.3; albumin 3.2; will continue supplements as directed  8. Chronic depression: is off remeron due to history of falls  9. Hypertension associated with stage 3a chronic kidney disease due to type 2 diabetes mellitus: b/p 132/70  10. Aortic arch atherosclerosis: (cxr 04-13-09): is on statin   11. Post menopausal osteopenia: t score -2.561 will monitor      Synthia Innocent NP [facility]  call [phone]

## 2023-03-17 ENCOUNTER — Non-Acute Institutional Stay (SKILLED_NURSING_FACILITY): Payer: Self-pay | Admitting: Adult Health

## 2023-03-17 ENCOUNTER — Encounter: Payer: Self-pay | Admitting: Adult Health

## 2023-03-17 DIAGNOSIS — E440 Moderate protein-calorie malnutrition: Secondary | ICD-10-CM

## 2023-03-17 DIAGNOSIS — H40053 Ocular hypertension, bilateral: Secondary | ICD-10-CM | POA: Diagnosis not present

## 2023-03-17 DIAGNOSIS — F0390 Unspecified dementia without behavioral disturbance: Secondary | ICD-10-CM

## 2023-03-17 DIAGNOSIS — Z794 Long term (current) use of insulin: Secondary | ICD-10-CM

## 2023-03-17 NOTE — Progress Notes (Signed)
 Location:  [facility] Room Number: 101 Place of Service:  SNF (31)   CODE STATUS: dnr   No Known Allergies  Chief Complaint  Patient presents with   Medical Management of Chronic Issues            Increased intraocular pressure bilateral:    Dementia without behavioral disturbance; Protein calorie malnutrition:      HPI:  She is a 78 year old long term resident of this facility being seen for the management of her chronic illnesses:   Increased intraocular pressure bilateral:    Dementia without behavioral disturbance; Protein calorie malnutrition. There are no reports of uncontrolled pain. She continues to get out of bed daily; her weight is stable.   Past Medical History:  Diagnosis Date   CAD (coronary artery disease)    DM (diabetes mellitus)  with CKD    GFR 30   Glaucoma    HTN (hypertension)    Obesity    Osteoarthritis     Past Surgical History:  Procedure Laterality Date   CHOLECYSTECTOMY     CORONARY STENT PLACEMENT     x3   REFRACTIVE SURGERY     detached retina   TUBAL LIGATION     78 yrs old    Social History   Socioeconomic History   Marital status: Divorced    Spouse name: Not on file   Number of children: 2   Years of education: Not on file   Highest education level: Not on file  Occupational History   Occupation: retired  Tobacco Use   Smoking status: Every Day    Current packs/day: 1.00    Types: Cigarettes   Smokeless tobacco: Not on file  Vaping Use   Vaping status: Not on file  Substance and Sexual Activity   Alcohol use: Yes    Comment: rarely   Drug use: Never   Sexual activity: Not on file  Other Topics Concern   Not on file  Social History Narrative   Not on file   Social Drivers of Health   Financial Resource Strain: Unknown (05/03/2021)   Received from [facility], [facility]   Overall Physicist, Medical Strain (CARDIA)    Difficulty of Paying Living  Expenses: Patient declined  Food Insecurity: Unknown (05/03/2021)   Received from [facility], [facility]   Hunger Vital Sign    Worried About Running Out of Food in the Last Year: Patient declined    Barista in the Last Year: Patient declined  Transportation Needs: Unknown (05/03/2021)   Received from [facility], [facility]   [employer] - Administrator, Civil Service (Medical): Patient declined    Lack of Transportation (Non-Medical): Patient declined  Physical Activity: Not on file  Stress: Not on file  Social Connections: Unknown (05/06/2022)   Received from [facility], [facility]   Social Network    Social Network: Not on file  Intimate Partner Violence: Unknown (05/06/2022)   Received from [facility], [facility]   HITS    Physically Hurt: Not on file    Insult or Talk Down To: Not on file    Threaten Physical Harm: Not on file    Scream or Curse: Not on file   Family History  Problem Relation Age of Onset   Stroke Father    Diabetes  Sister    Hypertension Sister    Arthritis Other    Heart disease Other    Hypertension Other    Stroke Other    Kidney disease Other    Diabetes Other       VITAL SIGNS BP 130/60   Pulse 74   Temp 98.6 F (37 C)   Resp 20   Ht 5' 5 (1.651 m)   Wt 208 lb 12.8 oz (94.7 kg)   SpO2 97%   BMI 34.75 kg/m   Outpatient Encounter Medications as of 03/17/2023  Medication Sig   acetaminophen (TYLENOL) 650 MG CR tablet Take 650 mg by mouth 3 (three) times daily.   Amino Acids-Protein Hydrolys (FEEDING SUPPLEMENT, PRO-STAT SUGAR FREE 64,) LIQD Take 30 mLs by mouth 2 (two) times daily.   aspirin 81 MG tablet Take 81 mg by mouth daily.   atorvastatin (LIPITOR) 20 MG tablet Take 20 mg by mouth daily.   calcium-vitamin D (OSCAL WITH D) 500-5 MG-MCG tablet Take 1 tablet by mouth daily.   clobetasol cream (TEMOVATE) 0.05 % Apply 1 Application topically  2 (two) times a week. Apply to labia/vaginal area twice a week. Once A Day on Mon, Fri; AS NEEDED   dapagliflozin propanediol (FARXIGA) 5 MG TABS tablet Take 5 mg by mouth daily.   dorzolamide-timolol (COSOPT) 22.3-6.8 MG/ML ophthalmic solution Place 1 drop into both eyes daily.  Wait at least 5 minutes between multiple drops in same eye.   ferrous sulfate 325 (65 FE) MG tablet Take 325 mg by mouth as directed. On Monday and Thursday   furosemide (LASIX) 20 MG tablet Take 20 mg by mouth daily.   Insulin Glargine (BASAGLAR KWIKPEN) 100 UNIT/ML Inject 23 Units into the skin at bedtime.   insulin lispro (HUMALOG KWIKPEN) 100 UNIT/ML KwikPen Inject 8 Units into the skin with breakfast, with lunch, and with evening meal.   latanoprost (XALATAN) 0.005 % ophthalmic solution Place 1 drop into both eyes daily.   loratadine  (CLARITIN ) 10 MG tablet Take 1 tablet (10 mg total) by mouth daily as needed for itching or rhinitis.   NON FORMULARY Diet:Regular   PRESCRIPTION MEDICATION Endit Skin Protectant Ointment; topical Special Instructions: Apply to vaginal/perineal area bid. Twice A Day   No facility-administered encounter medications on file as of 03/17/2023.     SIGNIFICANT DIAGNOSTIC EXAMS  07-25-21: dexa: t score: -2.521  NO NEW EXAMS   PREVIOUS   04-14-22: hgb A1c 7.0; ACR 134; micro-albumin 140.6 04-28-22: tsh 0.393   05-01-22: wbc 7.5; hgb 9.5; hct 29.7; mcv 97.1 plt 242; glucose 126; bun 42; creat 1.68; k+ 4.4; na++ 139; ca 8.0; gfr 31; protein 5.5 albumin 2.8  06-05-22: glucose 113; bun 59; creat 1.82; k+ 4.0; na++ 138; ca 8.2; gfr 28 08-14-22: albumin 3.1  09-11-22: hgb A1c 8.8 chol 124 ldl 56 trig 85 hdl 51 12-15-22: wbc 5.8; hgb 11.5; hct 36.6; mcv 94.1 plt 194; glucose 75; bun 53; creat 1.74; k+ 3.8; na++ 139; ca 8.5; gfr 29; hgb A1c 7.0; tsh 2.108  NO NEW LABS.      Review of Systems  Constitutional:  Negative for malaise/fatigue.  Respiratory:  Negative for cough and shortness of  breath.   Cardiovascular:  Negative for chest pain, palpitations and leg swelling.  Gastrointestinal:  Negative for abdominal pain, constipation and heartburn.  Musculoskeletal:  Negative for back pain, joint pain and myalgias.  Skin: Negative.   Neurological:  Negative for dizziness.  Psychiatric/Behavioral:  The patient is  not nervous/anxious.    Physical Exam Constitutional:      General: She is not in acute distress.    Appearance: She is well-developed. She is obese. She is not diaphoretic.  Neck:     Thyroid : No thyromegaly.  Cardiovascular:     Rate and Rhythm: Normal rate and regular rhythm.     Pulses: Normal pulses.     Heart sounds: Normal heart sounds.  Pulmonary:     Effort: Pulmonary effort is normal. No respiratory distress.     Breath sounds: Normal breath sounds.  Abdominal:     General: Bowel sounds are normal. There is no distension.     Palpations: Abdomen is soft.     Tenderness: There is no abdominal tenderness.  Musculoskeletal:        General: Normal range of motion.     Cervical back: Neck supple.     Right lower leg: No edema.     Left lower leg: No edema.  Lymphadenopathy:     Cervical: No cervical adenopathy.  Skin:    General: Skin is warm and dry.  Neurological:     Mental Status: She is alert. Mental status is at baseline.  Psychiatric:        Mood and Affect: Mood normal.       ASSESSMENT/ PLAN:  TODAY  Increased intraocular pressure bilateral: will continue cosopt and xalatan to both eyes.   2. Dementia without behavioral disturbance; weight is 208.8 pounds;   3. Protein calorie malnutrition: protein 6.3 albumin 3.2 will continue supplements as directed.   PREVIOUS   4. Chronic depression: is off remeron due to history of falls  5. 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 Aortic arch atherosclerosis: (cxr 04-13-09): is on statin   7. Post menopausal osteopenia:  t score -2.561 will monitor   8. Controlled type 2 diabetes mellitus with stage 3 chronic kidney disease with long term current use of insulin: hgb A1c 7.0 will continue farxiga 5 mg daily  basaglar 23 units nightly novolog 8 units with meals.   9. Stage 3 chronic kidney disease due to type 2 diabetes mellitus: bun 53; creat 1.74; gfr 29 will continue to monitor   10. Hyperlipidemia associated with type 2 diabetes mellitus: ldl 56 will continue lipitor 20 mg daily   11. Anemia due to stage 3 chronic kidney disease: hgb 11.5 will continue iron twice weekly      Barnie Seip NP [facility]  call [phone]

## 2023-04-03 ENCOUNTER — Encounter: Payer: Self-pay | Admitting: Internal Medicine

## 2023-04-03 ENCOUNTER — Non-Acute Institutional Stay (SKILLED_NURSING_FACILITY): Payer: Self-pay | Admitting: Internal Medicine

## 2023-04-03 DIAGNOSIS — E440 Moderate protein-calorie malnutrition: Secondary | ICD-10-CM | POA: Diagnosis not present

## 2023-04-03 DIAGNOSIS — D631 Anemia in chronic kidney disease: Secondary | ICD-10-CM

## 2023-04-03 DIAGNOSIS — F0390 Unspecified dementia without behavioral disturbance: Secondary | ICD-10-CM

## 2023-04-03 DIAGNOSIS — E1122 Type 2 diabetes mellitus with diabetic chronic kidney disease: Secondary | ICD-10-CM

## 2023-04-03 DIAGNOSIS — Z794 Long term (current) use of insulin: Secondary | ICD-10-CM

## 2023-04-03 DIAGNOSIS — R7989 Other specified abnormal findings of blood chemistry: Secondary | ICD-10-CM | POA: Diagnosis not present

## 2023-04-03 DIAGNOSIS — N1832 Chronic kidney disease, stage 3b: Secondary | ICD-10-CM

## 2023-04-03 NOTE — Assessment & Plan Note (Signed)
12/15/2022 H/H 11.5/36.6, up from prior values of 9.5/29.7.  No bleeding dyscrasias reported by staff.  Continue to monitor.

## 2023-04-03 NOTE — Assessment & Plan Note (Signed)
12/15/2022 TSH was therapeutic at 2.108.  This should be updated in the next 3 months.

## 2023-04-03 NOTE — Assessment & Plan Note (Signed)
There is been slight progression of her CKD with current BUN of 53, creatinine 1.74, and GFR of 29 indicating high stage IV CKD.  The GFR has ranged from a low of 28 up to a high of 31, and the latter would have been low stage IIIb. Despite this, there has been improvement in her diabetes with the most recent A1c of 7%, down from 8.8%. No change indicated; but both renal function and diabetic status should be updated next month.

## 2023-04-03 NOTE — Assessment & Plan Note (Signed)
Albumin is essentially unchanged to slightly improved with a value of 3.2, prior value was 3.1.  There has been a definite improvement in total protein with current total protein 6.3 and prior values of 5.5.  Nutritionist continues to monitor at [facility].

## 2023-04-03 NOTE — Progress Notes (Unsigned)
NURSING HOME LOCATION:  [facility] ROOM NUMBER: 101  CODE STATUS:  DNR  PCP:  Synthia Innocent NP  This is a nursing facility follow up visit of chronic medical diagnoses & to document compliance with Regulation 483.30 (c) in The Long Term Care Survey Manual Phase 2 which mandates caregiver visit ( visits can alternate among physician, PA or NP as per statutes) within 10 days of 30 days / 60 days/ 90 days post admission to SNF date    Interim medical record and care since last SNF visit was updated with review of diagnostic studies and change in clinical status since last visit were documented.  HPI: She is a permanent resident of this facility with medical diagnoses of diabetes with vascular complications & CKD, glaucoma, essential hypertension, degenerative joint disease, coronary artery disease, protein/caloric malnutrition, and vascular dementia. Surgeries and procedures include cholecystectomy and coronary stent placement. Most recent labs were performed 12/15/2022.  Her CKD is relatively stable with a BUN of 53, creatinine 1.74, GFR 29 indicating high CKD stage IV.  The GFR has ranged from a low 28 up to 31, the latter would be low stage IIIb.  Albumin was essentially stable rising from 3.1-3.2.  Total protein was 6.3, up from 5.5.  There has been improvement in her anemia with H/H of 11.5/36.6, up from prior values of 9.5/29.7.  A1c indicated excellent control with a value of 7%, down from 8.8%.  Her fasting blood sugars are averaging in the range of 120-175.  In October TSH was therapeutic at 2.108.  Review of systems: Dementia invalidated responses. She states "I'm doing just fine."She denies numbness & tingling, but states hands feel "weird." She describes a dry cough "only when I smoke."(She is not smoking here @ the SNF)  Constitutional: No fever, significant weight change, fatigue  Eyes: No redness, discharge, pain, vision change ENT/mouth: No nasal congestion,   purulent discharge, earache, change in hearing, sore throat  Cardiovascular: No chest pain, palpitations, paroxysmal nocturnal dyspnea,  edema  Respiratory: No sputum production, hemoptysis, DOE, significant snoring, apnea   Gastrointestinal: No heartburn, dysphagia, abdominal pain, nausea /vomiting, rectal bleeding, melena, change in bowels Genitourinary: No dysuria, hematuria, pyuria, incontinence, nocturia Musculoskeletal: No joint stiffness, joint swelling, weakness, pain Dermatologic: No rash, pruritus, change in appearance of skin Neurologic: No dizziness, headache, syncope, seizures Psychiatric: No significant anxiety, depression, insomnia, anorexia Endocrine: No change in hair/skin/nails, excessive thirst, excessive hunger, excessive urination  Hematologic/lymphatic: No significant bruising, lymphadenopathy, abnormal bleeding Allergy/immunology: No itchy/watery eyes, significant sneezing, urticaria, angioedema  Physical exam:  Pertinent or positive findings: She giggles after each response. Facies are slightly weathered. Slight ptosis on right.She is completely edentulous.Grade 1/2 systolic murmur at the base.  Slight increase in S1.  Abdomen is protuberant.  1+ edema at the sock line.  Pedal pulses markedly decreased.  Faint ecchymoses present over the forearms.  General appearance: Adequately nourished; no acute distress, increased work of breathing is present.   Lymphatic: No lymphadenopathy about the head, neck, axilla. Eyes: No conjunctival inflammation or lid edema is present. There is no scleral icterus. Ears:  External ear exam shows no significant lesions or deformities.   Nose:  External nasal examination shows no deformity or inflammation. Nasal mucosa are pink and moist without lesions, exudates Oral exam:  Lips and gums are healthy appearing. There is no oropharyngeal erythema or exudate. Neck:  No thyromegaly, masses, tenderness noted.    Heart:  Normal rate and regular  rhythm  without gallop, click, rub .  Lungs: Chest clear to auscultation without wheezes, rhonchi, rales, rubs. Abdomen: Bowel sounds are normal. Abdomen is soft and nontender with no organomegaly, hernias, masses. GU: Deferred  Extremities:  No cyanosis, clubbing  Neurologic exam :Balance, Rhomberg, finger to nose testing could not be completed due to clinical state Skin: Warm & dry w/o tenting. No significant lesions or rash.  See summary under each active problem in the Problem List with associated updated therapeutic plan

## 2023-04-03 NOTE — Patient Instructions (Signed)
See assessment and plan under each diagnosis in the problem list and acutely for this visit

## 2023-04-04 ENCOUNTER — Encounter: Payer: Self-pay | Admitting: Internal Medicine

## 2023-04-04 NOTE — Assessment & Plan Note (Signed)
She is pleasantly demented , giggling after each nonsensical response.

## 2023-05-07 ENCOUNTER — Encounter: Payer: Self-pay | Admitting: Adult Health

## 2023-05-07 ENCOUNTER — Non-Acute Institutional Stay (SKILLED_NURSING_FACILITY): Payer: Self-pay | Admitting: Adult Health

## 2023-05-07 DIAGNOSIS — F0390 Unspecified dementia without behavioral disturbance: Secondary | ICD-10-CM | POA: Diagnosis not present

## 2023-05-07 DIAGNOSIS — Z794 Long term (current) use of insulin: Secondary | ICD-10-CM

## 2023-05-07 DIAGNOSIS — I700 Atherosclerosis of aorta: Secondary | ICD-10-CM

## 2023-05-07 DIAGNOSIS — I129 Hypertensive chronic kidney disease with stage 1 through stage 4 chronic kidney disease, or unspecified chronic kidney disease: Secondary | ICD-10-CM

## 2023-05-07 DIAGNOSIS — E1122 Type 2 diabetes mellitus with diabetic chronic kidney disease: Secondary | ICD-10-CM

## 2023-05-07 DIAGNOSIS — N1831 Chronic kidney disease, stage 3a: Secondary | ICD-10-CM

## 2023-05-07 NOTE — Progress Notes (Signed)
 Location:  [facility] Room Number: 101D Place of Service:  SNF (31) Kacy Conely S,NP  CODE STATUS: DNR  No Known Allergies  Chief Complaint  Patient presents with   Acute Visit    Care plan meeting.    HPI:  We have come together for her care plan meeting. Family present. BIS 5/15 mood 0/30. She uses wheelchair without falls. She requires max to dependent assist with her adl care. She is incontinent of bladder and bowel. Dietary: feeds self; regular diet; weight is 210.2 pounds; appetite is 76-100%. Therapy: none at this time. We have talked about her advanced directives and have filled out MOST form. 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  Dementia without behavioral disturbance  Long term insulin use   Aortic arch atherosclerosis  Past Medical History:  Diagnosis Date   CAD (coronary artery disease)    DM (diabetes mellitus)  with CKD    GFR 30   Glaucoma    HTN (hypertension)    Obesity    Osteoarthritis     Past Surgical History:  Procedure Laterality Date   CHOLECYSTECTOMY     CORONARY STENT PLACEMENT     x3   REFRACTIVE SURGERY     detached retina   TUBAL LIGATION     77 yrs old    Social History   Socioeconomic History   Marital status: Divorced    Spouse name: Not on file   Number of children: 2   Years of education: Not on file   Highest education level: Not on file  Occupational History   Occupation: retired  Tobacco Use   Smoking status: Former    Current packs/day: 1.00    Types: Cigarettes   Smokeless tobacco: Not on file   Tobacco comments:    She quit smoking on day of admission to [facility] .  Vaping Use   Vaping status: Not on file  Substance and Sexual Activity   Alcohol use: Yes    Comment: rarely   Drug use: Never   Sexual activity: Not on file  Other Topics Concern   Not on file   Social History Narrative   Not on file   Social Drivers of Health   Financial Resource Strain: Unknown (05/03/2021)   Received from [facility], [facility]   Overall Financial Resource Strain (CARDIA)    Difficulty of Paying Living Expenses: Patient declined  Food Insecurity: Unknown (05/03/2021)   Received from [facility], [facility]   Hunger Vital Sign    Worried About Running Out of Food in the Last Year: Patient declined    Barista in the Last Year: Patient declined  Transportation Needs: Unknown (05/03/2021)   Received from [facility] [employer], [facility]   [employer] - Administrator, Civil Service (Medical): Patient declined    Lack of Transportation (Non-Medical): Patient declined  Physical Activity: Not on file  Stress: Not on file  Social Connections: Unknown (05/06/2022)   Received from [facility], [facility]   Social Network    Social Network: Not on file  Intimate Partner Violence: Unknown (05/06/2022)   Received from [facility], [facility]   HITS    Physically Hurt: Not on file    Insult or  Talk Down To: Not on file    Threaten Physical Harm: Not on file    Scream or Curse: Not on file   Family History  Problem Relation Age of Onset   Stroke Father    Diabetes Sister    Hypertension Sister    Arthritis Other    Heart disease Other    Hypertension Other    Stroke Other    Kidney disease Other    Diabetes Other       VITAL SIGNS BP 136/66   Pulse 68   Temp 98.4 F (36.9 C)   Resp 20   Ht 5\' 5"  (1.651 m)   Wt 210 lb 3.2 oz (95.3 kg)   SpO2 94%   BMI 34.98 kg/m   Outpatient Encounter Medications as of 05/07/2023  Medication Sig   acetaminophen (TYLENOL) 650 MG CR tablet Take 650 mg by mouth 3 (three) times daily.   Amino Acids-Protein Hydrolys (FEEDING SUPPLEMENT, PRO-STAT SUGAR FREE 64,) LIQD Take 30 mLs by mouth 2 (two) times  daily.   aspirin 81 MG tablet Take 81 mg by mouth daily.   atorvastatin (LIPITOR) 20 MG tablet Take 20 mg by mouth daily.   calcium-vitamin D (OSCAL WITH D) 500-5 MG-MCG tablet Take 1 tablet by mouth daily.   clobetasol cream (TEMOVATE) 0.05 % Apply 1 Application topically 2 (two) times a week. Apply to labia/vaginal area twice a week. Once A Day on Mon, Fri; AS NEEDED   dapagliflozin propanediol (FARXIGA) 5 MG TABS tablet Take 5 mg by mouth daily.   dorzolamide-timolol (COSOPT) 22.3-6.8 MG/ML ophthalmic solution Place 1 drop into both eyes daily.  Wait at least 5 minutes between multiple drops in same eye.   ferrous sulfate 325 (65 FE) MG tablet Take 325 mg by mouth as directed. On Monday and Thursday   furosemide (LASIX) 20 MG tablet Take 20 mg by mouth daily.   Insulin Glargine (BASAGLAR KWIKPEN) 100 UNIT/ML Inject 23 Units into the skin at bedtime.   insulin lispro (HUMALOG KWIKPEN) 100 UNIT/ML KwikPen Inject 8 Units into the skin with breakfast, with lunch, and with evening meal.   latanoprost (XALATAN) 0.005 % ophthalmic solution Place 1 drop into both eyes daily.   loratadine (CLARITIN) 10 MG tablet Take 1 tablet (10 mg total) by mouth daily as needed for itching or rhinitis.   NON FORMULARY Diet:Regular   PRESCRIPTION MEDICATION Endit Skin Protectant Ointment; topical Special Instructions: Apply to vaginal/perineal area bid. Twice A Day   No facility-administered encounter medications on file as of 05/07/2023.     SIGNIFICANT DIAGNOSTIC EXAMS  PREVIOUS   04-14-22: hgb A1c 7.0; ACR 134; micro-albumin 140.6 04-28-22: tsh 0.393   05-01-22: wbc 7.5; hgb 9.5; hct 29.7; mcv 97.1 plt 242; glucose 126; bun 42; creat 1.68; k+ 4.4; na++ 139; ca 8.0; gfr 31; protein 5.5 albumin 2.8  06-05-22: glucose 113; bun 59; creat 1.82; k+ 4.0; na++ 138; ca 8.2; gfr 28 08-14-22: albumin 3.1  09-11-22: hgb A1c 8.8 chol 124 ldl 56 trig 85 hdl 51 12-15-22: wbc 5.8; hgb 11.5; hct 36.6; mcv 94.1 plt 194; glucose  75; bun 53; creat 1.74; k+ 3.8; na++ 139; ca 8.5; gfr 29; hgb A1c 7.0; tsh 2.108  NO NEW LABS.      Review of Systems  Constitutional:  Negative for malaise/fatigue.  Respiratory:  Negative for cough and shortness of breath.   Cardiovascular:  Negative for chest pain, palpitations and leg swelling.  Gastrointestinal:  Negative for abdominal  pain, constipation and heartburn.  Musculoskeletal:  Negative for back pain, joint pain and myalgias.  Skin: Negative.   Neurological:  Negative for dizziness.  Psychiatric/Behavioral:  The patient is not nervous/anxious.    Physical Exam Constitutional:      General: She is not in acute distress.    Appearance: She is well-developed. She is obese. She is not diaphoretic.  Neck:     Thyroid: No thyromegaly.  Cardiovascular:     Rate and Rhythm: Normal rate and regular rhythm.     Heart sounds: Normal heart sounds.  Pulmonary:     Effort: Pulmonary effort is normal. No respiratory distress.     Breath sounds: Normal breath sounds.  Abdominal:     General: Bowel sounds are normal. There is no distension.     Palpations: Abdomen is soft.     Tenderness: There is no abdominal tenderness.  Musculoskeletal:        General: Normal range of motion.     Cervical back: Neck supple.     Right lower leg: No edema.     Left lower leg: No edema.  Lymphadenopathy:     Cervical: No cervical adenopathy.  Skin:    General: Skin is warm and dry.  Neurological:     Mental Status: She is alert. Mental status is at baseline.  Psychiatric:        Mood and Affect: Mood normal.    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   Time spent with patient 40 minutes: (20 minutes with  advanced directives MOST form filled out.)   Synthia Innocent NP [facility]  call [phone]

## 2023-05-11 ENCOUNTER — Encounter: Payer: Self-pay | Admitting: Adult Health

## 2023-05-11 ENCOUNTER — Non-Acute Institutional Stay (SKILLED_NURSING_FACILITY): Payer: Self-pay | Admitting: Adult Health

## 2023-05-11 DIAGNOSIS — N1831 Chronic kidney disease, stage 3a: Secondary | ICD-10-CM

## 2023-05-11 DIAGNOSIS — I700 Atherosclerosis of aorta: Secondary | ICD-10-CM

## 2023-05-11 DIAGNOSIS — E1122 Type 2 diabetes mellitus with diabetic chronic kidney disease: Secondary | ICD-10-CM | POA: Diagnosis not present

## 2023-05-11 DIAGNOSIS — I129 Hypertensive chronic kidney disease with stage 1 through stage 4 chronic kidney disease, or unspecified chronic kidney disease: Secondary | ICD-10-CM

## 2023-05-11 DIAGNOSIS — Z794 Long term (current) use of insulin: Secondary | ICD-10-CM

## 2023-05-11 DIAGNOSIS — F339 Major depressive disorder, recurrent, unspecified: Secondary | ICD-10-CM

## 2023-05-11 NOTE — Progress Notes (Signed)
 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 There are no reports of uncontrolled pain. She remains sociable. There are no indications of depressive thoughts or anxiety,   Past Medical History:  Diagnosis Date   CAD (coronary artery disease)    DM (diabetes mellitus)  with CKD    GFR 30   Glaucoma    HTN (hypertension)    Obesity    Osteoarthritis     Past Surgical History:  Procedure Laterality Date   CHOLECYSTECTOMY     CORONARY STENT PLACEMENT     x3   REFRACTIVE SURGERY     detached retina   TUBAL LIGATION     78 yrs old    Social History   Socioeconomic History   Marital status: Divorced    Spouse name: Not on file   Number of children: 2   Years of education: Not on file   Highest education level: Not on file  Occupational History   Occupation: retired  Tobacco Use   Smoking status: Former    Current packs/day: 1.00    Types: Cigarettes   Smokeless tobacco: Not on file   Tobacco comments:    She quit smoking on day of admission to [facility] .  Vaping Use   Vaping status: Not on file  Substance and Sexual Activity   Alcohol use: Yes    Comment: rarely   Drug use: Never   Sexual activity: Not on file  Other Topics Concern   Not on file  Social History Narrative   Not on file   Social Drivers of Health   Financial Resource Strain: Unknown (05/03/2021)    Received from [facility] [place], [facility] [place]   Overall Financial Resource Strain (CARDIA)    Difficulty of Paying Living Expenses: Patient declined  Food Insecurity: Unknown (05/03/2021)   Received from [facility] [place], [facility] [employer] Vital Sign    Worried About Running Out of Food in the Last Year: Patient declined    Barista in the Last Year: Patient declined  Transportation Needs: Unknown (05/03/2021)   Received from [facility], [facility] [place]   [employer] - Administrator, Civil Service (Medical): Patient declined    Lack of Transportation (Non-Medical): Patient declined  Physical Activity: Not on file  Stress: Not on file  Social Connections: Unknown (05/06/2022)   Received from [facility], [facility]   Social Network    Social Network: Not on file  Intimate Partner Violence: Unknown (05/06/2022)   Received from [facility], [facility]   HITS    Physically Hurt: Not on file    Insult or Talk Down To: Not on file    Threaten Physical  Harm: Not on file    Scream or Curse: Not on file   Family History  Problem Relation Age of Onset   Stroke Father    Diabetes Sister    Hypertension Sister    Arthritis Other    Heart disease Other    Hypertension Other    Stroke Other    Kidney disease Other    Diabetes Other       VITAL SIGNS BP 138/68   Pulse 66   Temp (!) 97.1 F (36.2 C)   Resp 20   Ht 5\' 5"  (1.651 m)   Wt 210 lb 3.2 oz (95.3 kg)   SpO2 94%   BMI 34.98 kg/m   Outpatient Encounter Medications as of 05/11/2023  Medication Sig   acetaminophen (TYLENOL) 650 MG CR tablet Take 650 mg by mouth 3 (three) times daily.   Amino Acids-Protein Hydrolys (FEEDING SUPPLEMENT, PRO-STAT SUGAR FREE 64,) LIQD Take 30 mLs by mouth 2 (two) times daily.   aspirin 81 MG tablet Take 81 mg by mouth daily.   atorvastatin (LIPITOR) 20 MG tablet Take 20 mg by mouth daily.    calcium-vitamin D (OSCAL WITH D) 500-5 MG-MCG tablet Take 1 tablet by mouth daily.   clobetasol cream (TEMOVATE) 0.05 % Apply 1 Application topically 2 (two) times a week. Apply to labia/vaginal area twice a week. Once A Day on Mon, Fri; AS NEEDED   dapagliflozin propanediol (FARXIGA) 5 MG TABS tablet Take 5 mg by mouth daily.   dorzolamide-timolol (COSOPT) 22.3-6.8 MG/ML ophthalmic solution Place 1 drop into both eyes daily.  Wait at least 5 minutes between multiple drops in same eye.   ferrous sulfate 325 (65 FE) MG tablet Take 325 mg by mouth as directed. On Monday and Thursday   furosemide (LASIX) 20 MG tablet Take 20 mg by mouth daily.   Insulin Glargine (BASAGLAR KWIKPEN) 100 UNIT/ML Inject 23 Units into the skin at bedtime.   insulin lispro (HUMALOG KWIKPEN) 100 UNIT/ML KwikPen Inject 8 Units into the skin with breakfast, with lunch, and with evening meal.   latanoprost (XALATAN) 0.005 % ophthalmic solution Place 1 drop into both eyes daily.   loratadine (CLARITIN) 10 MG tablet Take 1 tablet (10 mg total) by mouth daily as needed for itching or rhinitis.   NON FORMULARY Diet:Regular   PRESCRIPTION MEDICATION Endit Skin Protectant Ointment; topical Special Instructions: Apply to vaginal/perineal area bid. Twice A Day   No facility-administered encounter medications on file as of 05/11/2023.     SIGNIFICANT DIAGNOSTIC EXAMS   PREVIOUS   04-14-22: hgb A1c 7.0; ACR 134; micro-albumin 140.6 04-28-22: tsh 0.393   05-01-22: wbc 7.5; hgb 9.5; hct 29.7; mcv 97.1 plt 242; glucose 126; bun 42; creat 1.68; k+ 4.4; na++ 139; ca 8.0; gfr 31; protein 5.5 albumin 2.8  06-05-22: glucose 113; bun 59; creat 1.82; k+ 4.0; na++ 138; ca 8.2; gfr 28 08-14-22: albumin 3.1  09-11-22: hgb A1c 8.8 chol 124 ldl 56 trig 85 hdl 51 12-15-22: wbc 5.8; hgb 11.5; hct 36.6; mcv 94.1 plt 194; glucose 75; bun 53; creat 1.74; k+ 3.8; na++ 139; ca 8.5; gfr 29; hgb A1c 7.0; tsh 2.108  NO NEW LABS.      Review of Systems   Constitutional:  Negative for malaise/fatigue.  Respiratory:  Negative for cough and shortness of breath.   Cardiovascular:  Negative for chest pain, palpitations and leg swelling.  Gastrointestinal:  Negative for abdominal pain, constipation and heartburn.  Musculoskeletal:  Negative for  back pain, joint pain and myalgias.  Skin: Negative.   Neurological:  Negative for dizziness.  Psychiatric/Behavioral:  The patient is not nervous/anxious.    Physical Exam Constitutional:      General: She is not in acute distress.    Appearance: She is well-developed. She is obese. She is not diaphoretic.  Neck:     Thyroid: No thyromegaly.  Cardiovascular:     Rate and Rhythm: Normal rate and regular rhythm.     Pulses: Normal pulses.     Heart sounds: Normal heart sounds.  Pulmonary:     Effort: Pulmonary effort is normal. No respiratory distress.     Breath sounds: Normal breath sounds.  Abdominal:     General: Bowel sounds are normal. There is no distension.     Palpations: Abdomen is soft.     Tenderness: There is no abdominal tenderness.  Musculoskeletal:        General: Normal range of motion.     Cervical back: Neck supple.     Right lower leg: No edema.     Left lower leg: No edema.  Lymphadenopathy:     Cervical: No cervical adenopathy.  Skin:    General: Skin is warm and dry.  Neurological:     Mental Status: She is alert. Mental status is at baseline.  Psychiatric:        Mood and Affect: Mood normal.        ASSESSMENT/ PLAN:  TODAY  Chronic depression: is off remeron due to history of falls  2. 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 Aortic arch atherosclerosis: (cxr 04-13-09) is on statin.   PREVIOUS   4. Post menopausal osteopenia: t score -2.561 will monitor   5. Controlled type 2 diabetes mellitus with stage 3 chronic kidney disease with long term current use of insulin: hgb A1c 7.0 will  continue farxiga 5 mg daily  basaglar 23 units nightly novolog 8 units with meals.   6. Stage 3 chronic kidney disease due to type 2 diabetes mellitus: bun 53; creat 1.74; gfr 29 will continue to monitor   7. Hyperlipidemia associated with type 2 diabetes mellitus: ldl 56 will continue lipitor 20 mg daily   8. Anemia due to stage 3 chronic kidney disease: hgb 11.5 will continue iron twice weekly   9. Increased intraocular pressure bilateral: will continue cosopt and xalatan to both eyes.   10. Dementia without behavioral disturbance; weight is 210 pounds;   11. Protein calorie malnutrition: protein 6.3 albumin 3.2 will continue supplements as directed.    Synthia Innocent NP [facility]  call [phone]

## 2023-06-10 ENCOUNTER — Non-Acute Institutional Stay (SKILLED_NURSING_FACILITY): Payer: Self-pay | Admitting: Adult Health

## 2023-06-10 ENCOUNTER — Encounter: Payer: Self-pay | Admitting: Adult Health

## 2023-06-10 DIAGNOSIS — M810 Age-related osteoporosis without current pathological fracture: Secondary | ICD-10-CM

## 2023-06-10 DIAGNOSIS — E1122 Type 2 diabetes mellitus with diabetic chronic kidney disease: Secondary | ICD-10-CM

## 2023-06-10 DIAGNOSIS — N183 Chronic kidney disease, stage 3 unspecified: Secondary | ICD-10-CM

## 2023-06-10 DIAGNOSIS — N1832 Chronic kidney disease, stage 3b: Secondary | ICD-10-CM | POA: Diagnosis not present

## 2023-06-10 DIAGNOSIS — Z794 Long term (current) use of insulin: Secondary | ICD-10-CM

## 2023-06-10 NOTE — Progress Notes (Signed)
 Location:  [facility] Room Number: 101 Place of Service:  SNF (31)   CODE STATUS: dnr   No Known Allergies  Chief Complaint  Patient presents with   Medical Management of Chronic Issues         Post menopausal osteopenia: Controlled type 2 diabetes mellitus with stage 3 chronic kidney disease with long term current use of insulin:Stage 3 chronic kidney disease due to type 2 diabetes mellitis      HPI:  She is a 78 year old long term resident of this facility being seen for the management of her chronic illnesses:   Post menopausal osteopenia: Controlled type 2 diabetes mellitus with stage 3 chronic kidney disease with long term current use of insulin:Stage 3 chronic kidney disease due to type 2 diabetes mellitis. There are no reports of uncontrolled pain. No reports of changes in appetite; no report of anxiety.   Past Medical History:  Diagnosis Date   CAD (coronary artery disease)    DM (diabetes mellitus)  with CKD    GFR 30   Glaucoma    HTN (hypertension)    Obesity    Osteoarthritis     Past Surgical History:  Procedure Laterality Date   CHOLECYSTECTOMY     CORONARY STENT PLACEMENT     x3   REFRACTIVE SURGERY     detached retina   TUBAL LIGATION     78 yrs old    Social History   Socioeconomic History   Marital status: Divorced    Spouse name: Not on file   Number of children: 2   Years of education: Not on file   Highest education level: Not on file  Occupational History   Occupation: retired  Tobacco Use   Smoking status: Former    Current packs/day: 1.00    Types: Cigarettes   Smokeless tobacco: Not on file   Tobacco comments:    She quit smoking on day of admission to [facility] .  Vaping Use   Vaping status: Not on file  Substance and Sexual Activity   Alcohol use: Yes    Comment: rarely   Drug use: Never   Sexual activity: Not on file  Other Topics Concern   Not on file  Social History Narrative   Not on file    Social Drivers of Health   Financial Resource Strain: Unknown (05/03/2021)   Received [facility] [place], [facility] [place]   Overall Financial Resource Strain (CARDIA)    Difficulty of Paying Living Expenses: Patient declined  Food Insecurity: Unknown (05/03/2021)   Received from [facility] [place], [facility] [place]   Hunger Vital Sign    Worried About Running Out of Food in the Last Year: Patient declined    Barista in the Last Year: Patient declined  Transportation Needs: Unknown (05/03/2021)   Received from [facility] [facility], [facility] [place]   [employer] - Administrator, Civil Service (Medical): Patient declined    Lack of Transportation (Non-Medical): Patient declined  Physical Activity: Not on file  Stress: Not on file  Social Connections: Unknown (05/06/2022)   Received from [facility], [facility]   Social Network    Social Network: Not on file  Intimate Partner Violence: Unknown (05/06/2022)   Received from [facility], [facility]   HITS    Physically Hurt: Not on file  Insult or Talk Down To: Not on file    Threaten Physical Harm: Not on file    Scream or Curse: Not on file   Family History  Problem Relation Age of Onset   Stroke Father    Diabetes Sister    Hypertension Sister    Arthritis Other    Heart disease Other    Hypertension Other    Stroke Other    Kidney disease Other    Diabetes Other       VITAL SIGNS BP (!) 140/65   Pulse 70   Temp (!) 96.9 F (36.1 C)   Resp (!) 24   Ht 5\' 5"  (1.651 m)   Wt 210 lb (95.3 kg)   SpO2 98%   BMI 34.95 kg/m   Outpatient Encounter Medications as of 06/10/2023  Medication Sig   acetaminophen (TYLENOL) 650 MG CR tablet Take 650 mg by mouth 3 (three) times daily.   Amino Acids-Protein Hydrolys (FEEDING SUPPLEMENT, PRO-STAT SUGAR FREE 64,) LIQD Take 30 mLs by mouth 2 (two) times daily.   aspirin 81 MG tablet Take 81  mg by mouth daily.   atorvastatin (LIPITOR) 20 MG tablet Take 20 mg by mouth daily.   calcium-vitamin D (OSCAL WITH D) 500-5 MG-MCG tablet Take 1 tablet by mouth daily.   clobetasol cream (TEMOVATE) 0.05 % Apply 1 Application topically 2 (two) times a week. Apply to labia/vaginal area twice a week. Once A Day on Mon, Fri; AS NEEDED   dapagliflozin propanediol (FARXIGA) 5 MG TABS tablet Take 5 mg by mouth daily.   dorzolamide-timolol (COSOPT) 22.3-6.8 MG/ML ophthalmic solution Place 1 drop into both eyes daily.  Wait at least 5 minutes between multiple drops in same eye.   ferrous sulfate 325 (65 FE) MG tablet Take 325 mg by mouth as directed. On Monday and Thursday   furosemide (LASIX) 20 MG tablet Take 20 mg by mouth daily.   Insulin Glargine (BASAGLAR KWIKPEN) 100 UNIT/ML Inject 23 Units into the skin at bedtime.   insulin lispro (HUMALOG KWIKPEN) 100 UNIT/ML KwikPen Inject 8 Units into the skin with breakfast, with lunch, and with evening meal.   latanoprost (XALATAN) 0.005 % ophthalmic solution Place 1 drop into both eyes daily.   loratadine (CLARITIN) 10 MG tablet Take 1 tablet (10 mg total) by mouth daily as needed for itching or rhinitis.   NON FORMULARY Diet:Regular   PRESCRIPTION MEDICATION Endit Skin Protectant Ointment; topical Special Instructions: Apply to vaginal/perineal area bid. Twice A Day   No facility-administered encounter medications on file as of 06/10/2023.     SIGNIFICANT DIAGNOSTIC EXAMS  PREVIOUS   06-05-22: glucose 113; bun 59; creat 1.82; k+ 4.0; na++ 138; ca 8.2; gfr 28 08-14-22: albumin 3.1  09-11-22: hgb A1c 8.8 chol 124 ldl 56 trig 85 hdl 51 12-15-22: wbc 5.8; hgb 11.5; hct 36.6; mcv 94.1 plt 194; glucose 75; bun 53; creat 1.74; k+ 3.8; na++ 139; ca 8.5; gfr 29; hgb A1c 7.0; tsh 2.108  NO NEW LABS.      Review of Systems  Constitutional:  Negative for malaise/fatigue.  Respiratory:  Negative for cough and shortness of breath.   Cardiovascular:  Negative  for chest pain, palpitations and leg swelling.  Gastrointestinal:  Negative for abdominal pain, constipation and heartburn.  Musculoskeletal:  Negative for back pain, joint pain and myalgias.  Skin: Negative.   Neurological:  Negative for dizziness.  Psychiatric/Behavioral:  The patient is not nervous/anxious.    Physical Exam Constitutional:  General: She is not in acute distress.    Appearance: She is well-developed. She is morbidly obese. She is not diaphoretic.  Neck:     Thyroid: No thyromegaly.  Cardiovascular:     Rate and Rhythm: Normal rate and regular rhythm.     Pulses: Normal pulses.     Heart sounds: Normal heart sounds.  Pulmonary:     Effort: Pulmonary effort is normal. No respiratory distress.     Breath sounds: Normal breath sounds.  Abdominal:     General: Bowel sounds are normal. There is no distension.     Palpations: Abdomen is soft.     Tenderness: There is no abdominal tenderness.  Musculoskeletal:        General: Normal range of motion.     Cervical back: Neck supple.     Right lower leg: No edema.     Left lower leg: No edema.  Lymphadenopathy:     Cervical: No cervical adenopathy.  Skin:    General: Skin is warm and dry.  Neurological:     Mental Status: She is alert. Mental status is at baseline.  Psychiatric:        Mood and Affect: Mood normal.       ASSESSMENT/ PLAN:  TODAY  Post menopausal osteopenia: t score -2.561; will monitor   2. Controlled type 2 diabetes mellitus with stage 3 chronic kidney disease with long term current use of insulin: hgb A1c 7.0; will continue farxiga 5 mg daily basaglar 23 units nightly and novolog 8 units with meals  3. Stage 3 chronic kidney disease due to type 2 diabetes mellitis bun 53; creat 1.74 gfr 39;    PREVIOUS   4. Hyperlipidemia associated with type 2 diabetes mellitus: ldl 56 will continue lipitor 20 mg daily   5. Anemia due to stage 3 chronic kidney disease: hgb 11.5 will continue iron  twice weekly   6. Increased intraocular pressure bilateral: will continue cosopt and xalatan to both eyes.   7. Dementia without behavioral disturbance; weight is 250 pounds;   8. Protein calorie malnutrition: protein 6.3 albumin 3.2 will continue supplements as directed.   9. Chronic depression: is off remeron due to history of falls  10. 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 Aortic arch atherosclerosis: (cxr 04-13-09) is on statin.  Synthia Innocent NP [facility]  call [phone]

## 2023-06-11 ENCOUNTER — Encounter: Payer: Self-pay | Admitting: Adult Health

## 2023-06-11 ENCOUNTER — Non-Acute Institutional Stay (SKILLED_NURSING_FACILITY): Payer: Self-pay | Admitting: Adult Health

## 2023-06-11 DIAGNOSIS — F0390 Unspecified dementia without behavioral disturbance: Secondary | ICD-10-CM | POA: Diagnosis not present

## 2023-06-11 DIAGNOSIS — R635 Abnormal weight gain: Secondary | ICD-10-CM

## 2023-06-11 NOTE — Progress Notes (Signed)
 Location:  [facility] Room Number: 101 Place of Service:  SNF (31)   CODE STATUS: dnr   No Known Allergies  Chief Complaint  Patient presents with   Acute Visit    Weight gain     HPI:  She has gained nearly 50 pounds in the past month. 6 months ago she weighed 205 pounds 3 months ago 208 pounds. In April she weighed 250 pounds with a re weight of 249 pounds. There are no reports of shortness of breath no reports of lower extremity edema. There are no reports of excessive po intake in the past month. She does take lasix daily for lower extremity edema.   Past Medical History:  Diagnosis Date   CAD (coronary artery disease)    DM (diabetes mellitus)  with CKD    GFR 30   Glaucoma    HTN (hypertension)    Obesity    Osteoarthritis     Past Surgical History:  Procedure Laterality Date   CHOLECYSTECTOMY     CORONARY STENT PLACEMENT     x3   REFRACTIVE SURGERY     detached retina   TUBAL LIGATION     78 yrs old    Social History   Socioeconomic History   Marital status: Divorced    Spouse name: Not on file   Number of children: 2   Years of education: Not on file   Highest education level: Not on file  Occupational History   Occupation: retired  Tobacco Use   Smoking status: Former    Current packs/day: 1.00    Types: Cigarettes   Smokeless tobacco: Not on file   Tobacco comments:    She quit smoking on day of admission to [facility] .  Vaping Use   Vaping status: Not on file  Substance and Sexual Activity   Alcohol use: Yes    Comment: rarely   Drug use: Never   Sexual activity: Not on file  Other Topics Concern   Not on file  Social History Narrative   Not on file   Social Drivers of Health   Financial Resource Strain: Unknown (05/03/2021)   Received from [facility] [place], [facility] [place]   Overall Financial Resource Strain (CARDIA)    Difficulty of Paying Living Expenses: Patient declined  Food  Insecurity: Unknown (05/03/2021)   Received from [facility] [place], [facility] [employer] Vital Sign    Worried About Running Out of Food in the Last Year: Patient declined    Barista in the Last Year: Patient declined  Transportation Needs: Unknown (05/03/2021)   Received from [facility] [employer], [facility] [place]   [employer] - Administrator, Civil Service (Medical): Patient declined    Lack of Transportation (Non-Medical): Patient declined  Physical Activity: Not on file  Stress: Not on file  Social Connections: Unknown (05/06/2022)   Received from [facility], [facility]   Social Network    Social Network: Not on file  Intimate Partner Violence: Unknown (05/06/2022)   Received from [facility], [facility]   HITS    Physically Hurt: Not on file    Insult or Talk Down To: Not on file    Threaten Physical Harm: Not on file    Scream or Curse: Not on file   Family History  Problem Relation Age of Onset  Stroke Father    Diabetes Sister    Hypertension Sister    Arthritis Other    Heart disease Other    Hypertension Other    Stroke Other    Kidney disease Other    Diabetes Other       VITAL SIGNS BP 138/75   Pulse 80   Temp (!) 97.1 F (36.2 C)   Resp 20   Ht 5\' 5"  (1.651 m)   Wt 250 lb (113.4 kg)   SpO2 98%   BMI 41.60 kg/m   Outpatient Encounter Medications as of 06/11/2023  Medication Sig   acetaminophen (TYLENOL) 650 MG CR tablet Take 650 mg by mouth 3 (three) times daily.   Amino Acids-Protein Hydrolys (FEEDING SUPPLEMENT, PRO-STAT SUGAR FREE 64,) LIQD Take 30 mLs by mouth 2 (two) times daily.   aspirin 81 MG tablet Take 81 mg by mouth daily.   atorvastatin (LIPITOR) 20 MG tablet Take 20 mg by mouth daily.   calcium-vitamin D (OSCAL WITH D) 500-5 MG-MCG tablet Take 1 tablet by mouth daily.   clobetasol cream (TEMOVATE) 0.05 % Apply 1 Application topically 2 (two) times a week. Apply to  labia/vaginal area twice a week. Once A Day on Mon, Fri; AS NEEDED   dapagliflozin propanediol (FARXIGA) 5 MG TABS tablet Take 5 mg by mouth daily.   dorzolamide-timolol (COSOPT) 22.3-6.8 MG/ML ophthalmic solution Place 1 drop into both eyes daily.  Wait at least 5 minutes between multiple drops in same eye.   ferrous sulfate 325 (65 FE) MG tablet Take 325 mg by mouth as directed. On Monday and Thursday   furosemide (LASIX) 20 MG tablet Take 20 mg by mouth daily.   Insulin Glargine (BASAGLAR KWIKPEN) 100 UNIT/ML Inject 23 Units into the skin at bedtime.   insulin lispro (HUMALOG KWIKPEN) 100 UNIT/ML KwikPen Inject 8 Units into the skin with breakfast, with lunch, and with evening meal.   latanoprost (XALATAN) 0.005 % ophthalmic solution Place 1 drop into both eyes daily.   loratadine (CLARITIN) 10 MG tablet Take 1 tablet (10 mg total) by mouth daily as needed for itching or rhinitis.   NON FORMULARY Diet:Regular   PRESCRIPTION MEDICATION Endit Skin Protectant Ointment; topical Special Instructions: Apply to vaginal/perineal area bid. Twice A Day   No facility-administered encounter medications on file as of 06/11/2023.     SIGNIFICANT DIAGNOSTIC EXAMS  PREVIOUS   06-05-22: glucose 113; bun 59; creat 1.82; k+ 4.0; na++ 138; ca 8.2; gfr 28 08-14-22: albumin 3.1  09-11-22: hgb A1c 8.8 chol 124 ldl 56 trig 85 hdl 51 12-15-22: wbc 5.8; hgb 11.5; hct 36.6; mcv 94.1 plt 194; glucose 75; bun 53; creat 1.74; k+ 3.8; na++ 139; ca 8.5; gfr 29; hgb A1c 7.0; tsh 2.108  NO NEW LABS.   Review of Systems  Constitutional:  Negative for malaise/fatigue.  Respiratory:  Negative for cough and shortness of breath.   Cardiovascular:  Negative for chest pain, palpitations and leg swelling.  Gastrointestinal:  Negative for abdominal pain, constipation and heartburn.  Musculoskeletal:  Negative for back pain, joint pain and myalgias.  Skin: Negative.   Neurological:  Negative for dizziness.   Psychiatric/Behavioral:  The patient is not nervous/anxious.    Physical Exam Constitutional:      General: She is not in acute distress.    Appearance: She is well-developed. She is obese. She is not diaphoretic.  Neck:     Thyroid: No thyromegaly.  Cardiovascular:     Rate and Rhythm:  Normal rate and regular rhythm.     Heart sounds: Normal heart sounds.  Pulmonary:     Effort: Pulmonary effort is normal. No respiratory distress.     Breath sounds: Normal breath sounds.  Abdominal:     General: Bowel sounds are normal. There is no distension.     Palpations: Abdomen is soft.     Tenderness: There is no abdominal tenderness.  Musculoskeletal:        General: Normal range of motion.     Cervical back: Neck supple.     Right lower leg: No edema.     Left lower leg: No edema.  Lymphadenopathy:     Cervical: No cervical adenopathy.  Skin:    General: Skin is warm and dry.  Neurological:     Mental Status: She is alert. Mental status is at baseline.  Psychiatric:        Mood and Affect: Mood normal.    ASSESSMENT/ PLAN:  TODAY  Dementia without behavioral disturbance Weight gain  Will check cbc; cmp; tsh; hgb A1c Will check cbg twice daily And will weight m-w-f to better get a trend of her weight.     Synthia Innocent NP [facility]   call [phone]

## 2023-06-15 ENCOUNTER — Other Ambulatory Visit (HOSPITAL_COMMUNITY)
Admission: RE | Admit: 2023-06-15 | Discharge: 2023-06-15 | Disposition: A | Discharge status: Home / Self Care | Source: Skilled Nursing Facility | Attending: Adult Health | Admitting: Adult Health

## 2023-06-15 DIAGNOSIS — E119 Type 2 diabetes mellitus without complications: Secondary | ICD-10-CM | POA: Diagnosis not present

## 2023-06-15 LAB — COMPREHENSIVE METABOLIC PANEL WITH GFR
ALT: 20 U/L (ref 0–44)
AST: 19 U/L (ref 15–41)
Albumin: 3.2 g/dL — ABNORMAL LOW (ref 3.5–5.0)
Alkaline Phosphatase: 58 U/L (ref 38–126)
Anion gap: 11 (ref 5–15)
BUN: 51 mg/dL — ABNORMAL HIGH (ref 8–23)
CO2: 25 mmol/L (ref 22–32)
Calcium: 9 mg/dL (ref 8.9–10.3)
Chloride: 103 mmol/L (ref 98–111)
Creatinine, Ser: 1.81 mg/dL — ABNORMAL HIGH (ref 0.44–1.00)
GFR, Estimated: 28 mL/min — ABNORMAL LOW (ref >60)
Glucose, Bld: 160 mg/dL — ABNORMAL HIGH (ref 70–99)
Potassium: 3.6 mmol/L (ref 3.5–5.1)
Sodium: 139 mmol/L (ref 135–145)
Total Bilirubin: 0.4 mg/dL (ref 0.0–1.2)
Total Protein: 6.3 g/dL — ABNORMAL LOW (ref 6.5–8.1)

## 2023-06-15 LAB — CBC
HCT: 38.2 % (ref 36.0–46.0)
Hemoglobin: 12.3 g/dL (ref 12.0–15.0)
MCH: 31.4 pg (ref 26.0–34.0)
MCHC: 32.2 g/dL (ref 30.0–36.0)
MCV: 97.4 fL (ref 80.0–100.0)
Platelets: 169 10*3/uL (ref 150–400)
RBC: 3.92 MIL/uL (ref 3.87–5.11)
RDW: 13.9 % (ref 11.5–15.5)
WBC: 5.7 10*3/uL (ref 4.0–10.5)
nRBC: 0 % (ref 0.0–0.2)

## 2023-06-15 LAB — HEMOGLOBIN A1C
Hgb A1c MFr Bld: 8.4 % — ABNORMAL HIGH (ref 4.8–5.6)
Mean Plasma Glucose: 194.38 mg/dL

## 2023-06-15 LAB — LIPID PANEL
Cholesterol: 121 mg/dL (ref 0–200)
HDL: 41 mg/dL (ref >40)
LDL Cholesterol: 54 mg/dL (ref 0–99)
Total CHOL/HDL Ratio: 3 ratio
Triglycerides: 128 mg/dL (ref <150)
VLDL: 26 mg/dL (ref 0–40)

## 2023-06-15 LAB — TSH: TSH: 2.15 u[IU]/mL (ref 0.350–4.500)

## 2023-06-16 ENCOUNTER — Non-Acute Institutional Stay (SKILLED_NURSING_FACILITY): Payer: Self-pay | Admitting: Adult Health

## 2023-06-16 ENCOUNTER — Encounter: Payer: Self-pay | Admitting: Adult Health

## 2023-06-16 DIAGNOSIS — E785 Hyperlipidemia, unspecified: Secondary | ICD-10-CM | POA: Diagnosis not present

## 2023-06-16 DIAGNOSIS — E1122 Type 2 diabetes mellitus with diabetic chronic kidney disease: Secondary | ICD-10-CM

## 2023-06-16 DIAGNOSIS — E1169 Type 2 diabetes mellitus with other specified complication: Secondary | ICD-10-CM

## 2023-06-16 DIAGNOSIS — N183 Chronic kidney disease, stage 3 unspecified: Secondary | ICD-10-CM

## 2023-06-16 DIAGNOSIS — N1832 Chronic kidney disease, stage 3b: Secondary | ICD-10-CM | POA: Diagnosis not present

## 2023-06-16 DIAGNOSIS — Z794 Long term (current) use of insulin: Secondary | ICD-10-CM

## 2023-06-16 NOTE — Progress Notes (Signed)
 Location:  [facility] Room Number: 105 Place of Service:  SNF (31)   CODE STATUS: dnr   No Known Allergies  Chief Complaint  Patient presents with   Acute Visit    Follow up lab results     HPI:  Her hgb A1c is 8.4; which is higher than her desired level of <8. She is presently taking glargine 23 units; lispro 8 units with meals; farxiga 5 mg daily. Her cbg reading are elevated up to the upper 200's.   Past Medical History:  Diagnosis Date   CAD (coronary artery disease)    DM (diabetes mellitus)  with CKD    GFR 30   Glaucoma    HTN (hypertension)    Obesity    Osteoarthritis     Past Surgical History:  Procedure Laterality Date   CHOLECYSTECTOMY     CORONARY STENT PLACEMENT     x3   REFRACTIVE SURGERY     detached retina   TUBAL LIGATION     78 yrs old    Social History   Socioeconomic History   Marital status: Divorced    Spouse name: Not on file   Number of children: 2   Years of education: Not on file   Highest education level: Not on file  Occupational History   Occupation: retired  Tobacco Use   Smoking status: Former    Current packs/day: 1.00    Types: Cigarettes   Smokeless tobacco: Not on file   Tobacco comments:    She quit smoking on day of admission to [facility] .  Vaping Use   Vaping status: Not on file  Substance and Sexual Activity   Alcohol use: Yes    Comment: rarely   Drug use: Never   Sexual activity: Not on file  Other Topics Concern   Not on file  Social History Narrative   Not on file   Social Drivers of Health   Financial Resource Strain: Unknown (05/03/2021)   Received from [facility] [place], [facility] [place]   Overall Financial Resource Strain (CARDIA)    Difficulty of Paying Living Expenses: Patient declined  Food Insecurity: Unknown (05/03/2021)   Received from [facility] [place], [facility] [employer] Vital Sign    Worried About Running Out  of Food in the Last Year: Patient declined    Barista in the Last Year: Patient declined  Transportation Needs: Unknown (05/03/2021)   Received from [facility] [employer], [facility] [place]   [employer] - Administrator, Civil Service (Medical): Patient declined    Lack of Transportation (Non-Medical): Patient declined  Physical Activity: Not on file  Stress: Not on file  Social Connections: Unknown (05/06/2022)   Received from [facility], [facility]   Social Network    Social Network: Not on file  Intimate Partner Violence: Unknown (05/06/2022)   Received from [facility], [facility]   HITS    Physically Hurt: Not on file    Insult or Talk Down To: Not on file    Threaten Physical Harm: Not on file    Scream or Curse: Not on file   Family History  Problem Relation Age of Onset   Stroke Father    Diabetes Sister    Hypertension Sister    Arthritis Other    Heart disease Other    Hypertension  Other    Stroke Other    Kidney disease Other    Diabetes Other       VITAL SIGNS BP 138/78   Pulse 64   Temp (!) 97.4 F (36.3 C)   Resp 18   Ht 5\' 5"  (1.651 m)   Wt 211 lb 6.4 oz (95.9 kg)   SpO2 99%   BMI 35.18 kg/m   Outpatient Encounter Medications as of 06/16/2023  Medication Sig   acetaminophen (TYLENOL) 650 MG CR tablet Take 650 mg by mouth 3 (three) times daily.   Amino Acids-Protein Hydrolys (FEEDING SUPPLEMENT, PRO-STAT SUGAR FREE 64,) LIQD Take 30 mLs by mouth 2 (two) times daily.   aspirin 81 MG tablet Take 81 mg by mouth daily.   atorvastatin (LIPITOR) 20 MG tablet Take 20 mg by mouth daily.   calcium-vitamin D (OSCAL WITH D) 500-5 MG-MCG tablet Take 1 tablet by mouth daily.   clobetasol cream (TEMOVATE) 0.05 % Apply 1 Application topically 2 (two) times a week. Apply to labia/vaginal area twice a week. Once A Day on Mon, Fri; AS NEEDED   dapagliflozin propanediol (FARXIGA) 5 MG TABS tablet Take 5 mg by mouth daily.    dorzolamide-timolol (COSOPT) 22.3-6.8 MG/ML ophthalmic solution Place 1 drop into both eyes daily.  Wait at least 5 minutes between multiple drops in same eye.   ferrous sulfate 325 (65 FE) MG tablet Take 325 mg by mouth as directed. On Monday and Thursday   furosemide (LASIX) 20 MG tablet Take 20 mg by mouth daily.   Insulin Glargine (BASAGLAR KWIKPEN) 100 UNIT/ML Inject 23 Units into the skin at bedtime.   insulin lispro (HUMALOG KWIKPEN) 100 UNIT/ML KwikPen Inject 8 Units into the skin with breakfast, with lunch, and with evening meal.   latanoprost (XALATAN) 0.005 % ophthalmic solution Place 1 drop into both eyes daily.   loratadine (CLARITIN) 10 MG tablet Take 1 tablet (10 mg total) by mouth daily as needed for itching or rhinitis.   NON FORMULARY Diet:Regular   PRESCRIPTION MEDICATION Endit Skin Protectant Ointment; topical Special Instructions: Apply to vaginal/perineal area bid. Twice A Day   No facility-administered encounter medications on file as of 06/16/2023.     SIGNIFICANT DIAGNOSTIC EXAMS  PREVIOUS   06-05-22: glucose 113; bun 59; creat 1.82; k+ 4.0; na++ 138; ca 8.2; gfr 28 08-14-22: albumin 3.1  09-11-22: hgb A1c 8.8 chol 124 ldl 56 trig 85 hdl 51 12-15-22: wbc 5.8; hgb 11.5; hct 36.6; mcv 94.1 plt 194; glucose 75; bun 53; creat 1.74; k+ 3.8; na++ 139; ca 8.5; gfr 29; hgb A1c 7.0; tsh 2.108  TODAY  06-15-23: wbc 5.7; hgb 12.3; hct 39.2; mcv 97.4 plt 169; glucose 160; bun 51; creat 1.81; k+ 3.6; na++ 139; ca 9.0; gfr 28; protein 6.3 albumin 3.2; chol 121; ldl 54; trig 128; hdl 41; tsh 2.150; hgb A1c 8.4   Review of Systems  Constitutional:  Negative for malaise/fatigue.  Respiratory:  Negative for cough and shortness of breath.   Cardiovascular:  Negative for chest pain, palpitations and leg swelling.  Gastrointestinal:  Negative for abdominal pain, constipation and heartburn.  Musculoskeletal:  Negative for back pain, joint pain and myalgias.  Skin: Negative.    Neurological:  Negative for dizziness.  Psychiatric/Behavioral:  The patient is not nervous/anxious.    Physical Exam Constitutional:      General: She is not in acute distress.    Appearance: She is well-developed. She is obese. She is not diaphoretic.  Neck:  Thyroid: No thyromegaly.  Cardiovascular:     Rate and Rhythm: Normal rate and regular rhythm.     Pulses: Normal pulses.     Heart sounds: Normal heart sounds.  Pulmonary:     Effort: Pulmonary effort is normal. No respiratory distress.     Breath sounds: Normal breath sounds.  Abdominal:     General: Bowel sounds are normal. There is no distension.     Palpations: Abdomen is soft.     Tenderness: There is no abdominal tenderness.  Musculoskeletal:        General: Normal range of motion.     Cervical back: Neck supple.     Right lower leg: No edema.     Left lower leg: No edema.  Lymphadenopathy:     Cervical: No cervical adenopathy.  Skin:    General: Skin is warm and dry.  Neurological:     Mental Status: She is alert. Mental status is at baseline.  Psychiatric:        Mood and Affect: Mood normal.      ASSESSMENT/ PLAN:  Type 2 diabetes mellitus with stage 3b chronic kidney disease, with long-term current use of insulin (HCC) For her hgb A1c of 8.4; will increase her glargine to 28 units nightly and will increased lispro to 11 units with meals will monitor her status.   Stage 3 chronic kidney disease due to type 2 diabetes mellitus (HCC) Bun 51; creat 1.81; gfr 28; will monitor   Hyperlipidemia associated with type 2 diabetes mellitus (HCC) Ldl is 54; will continue lipitor 20 mg daily     Britt Candle NP [facility] Adult Medicine  call [phone]

## 2023-06-22 NOTE — Assessment & Plan Note (Signed)
 Ldl is 54; will continue lipitor 20 mg daily

## 2023-06-22 NOTE — Assessment & Plan Note (Signed)
 For her hgb A1c of 8.4; will increase her glargine to 28 units nightly and will increased lispro to 11 units with meals will monitor her status.

## 2023-06-22 NOTE — Assessment & Plan Note (Signed)
 Bun 51; creat 1.81; gfr 28; will monitor

## 2023-06-24 ENCOUNTER — Encounter: Payer: Self-pay | Admitting: Adult Health

## 2023-06-24 ENCOUNTER — Non-Acute Institutional Stay (SKILLED_NURSING_FACILITY): Admitting: Adult Health

## 2023-06-24 DIAGNOSIS — Z7984 Long term (current) use of oral hypoglycemic drugs: Secondary | ICD-10-CM | POA: Diagnosis not present

## 2023-06-24 DIAGNOSIS — R600 Localized edema: Secondary | ICD-10-CM | POA: Insufficient documentation

## 2023-06-24 DIAGNOSIS — L603 Nail dystrophy: Secondary | ICD-10-CM | POA: Diagnosis not present

## 2023-06-24 DIAGNOSIS — L602 Onychogryphosis: Secondary | ICD-10-CM | POA: Diagnosis not present

## 2023-06-24 DIAGNOSIS — E1151 Type 2 diabetes mellitus with diabetic peripheral angiopathy without gangrene: Secondary | ICD-10-CM | POA: Diagnosis not present

## 2023-06-24 NOTE — Assessment & Plan Note (Signed)
 She has 3-4+ bilateral lower extremity edema. She has gained 7 pounds to 215 pounds. Will begin her on lasix 40 mg daily with k+ 20 meq daily and will repeat labs in 2 weeks.

## 2023-06-24 NOTE — Progress Notes (Signed)
 Location:  [facility] Room Number: 101 Place of Service:  SNF (31)   CODE STATUS: dnr   No Known Allergies  Chief Complaint  Patient presents with   Acute Visit    Bilateral lower extremity edema     HPI:  Staff report that her bilateral lower extremity edema. She is wearing TED hose. She is taking lasix 20 mg daily. She denies any chest pain or shortness of breath. She denies a lower extremity pain. She does state that her legs a swollen all the time. She is gaining weight from 208 to her current weight of 215 pounds.   Past Medical History:  Diagnosis Date   CAD (coronary artery disease)    DM (diabetes mellitus)  with CKD    GFR 30   Glaucoma    HTN (hypertension)    Obesity    Osteoarthritis     Past Surgical History:  Procedure Laterality Date   CHOLECYSTECTOMY     CORONARY STENT PLACEMENT     x3   REFRACTIVE SURGERY     detached retina   TUBAL LIGATION     78 yrs old    Social History   Socioeconomic History   Marital status: Divorced    Spouse name: Not on file   Number of children: 2   Years of education: Not on file   Highest education level: Not on file  Occupational History   Occupation: retired  Tobacco Use   Smoking status: Former    Current packs/day: 1.00    Types: Cigarettes   Smokeless tobacco: Not on file   Tobacco comments:    She quit smoking on day of admission to [facility] .  Vaping Use   Vaping status: Not on file  Substance and Sexual Activity   Alcohol use: Yes    Comment: rarely   Drug use: Never   Sexual activity: Not on file  Other Topics Concern   Not on file  Social History Narrative   Not on file   Social Drivers of Health   Financial Resource Strain: Unknown (05/03/2021)   Received from [facility] [place], [facility] [place]   Overall Financial Resource Strain (CARDIA)    Difficulty of Paying Living Expenses: Patient declined  Food Insecurity: Unknown (05/03/2021)    Received from [facility] [place], [facility] [employer] Vital Sign    Worried About Running Out of Food in the Last Year: Patient declined    Barista in the Last Year: Patient declined  Transportation Needs: Unknown (05/03/2021)   Received from [facility] [employer], [facility] [place]   [employer] - Administrator, Civil Service (Medical): Patient declined    Lack of Transportation (Non-Medical): Patient declined  Physical Activity: Not on file  Stress: Not on file  Social Connections: Unknown (05/06/2022)   Received from [facility], [facility]   Social Network    Social Network: Not on file  Intimate Partner Violence: Unknown (05/06/2022)   Received from [facility], [facility]   HITS    Physically Hurt: Not on file    Insult or Talk Down To: Not on file    Threaten Physical Harm: Not on file    Scream or Curse: Not on file   Family History  Problem Relation Age of Onset   Stroke Father    Diabetes Sister  Hypertension Sister    Arthritis Other    Heart disease Other    Hypertension Other    Stroke Other    Kidney disease Other    Diabetes Other       VITAL SIGNS BP (!) 110/55   Pulse 64   Temp 98 F (36.7 C)   Resp 16   Ht 5\' 5"  (1.651 m)   Wt 215 lb 6.4 oz (97.7 kg)   SpO2 96%   BMI 35.84 kg/m   Outpatient Encounter Medications as of 06/24/2023  Medication Sig   acetaminophen (TYLENOL) 650 MG CR tablet Take 650 mg by mouth 3 (three) times daily.   Amino Acids-Protein Hydrolys (FEEDING SUPPLEMENT, PRO-STAT SUGAR FREE 64,) LIQD Take 30 mLs by mouth 2 (two) times daily.   aspirin 81 MG tablet Take 81 mg by mouth daily.   atorvastatin (LIPITOR) 20 MG tablet Take 20 mg by mouth daily.   calcium-vitamin D (OSCAL WITH D) 500-5 MG-MCG tablet Take 1 tablet by mouth daily.   clobetasol cream (TEMOVATE) 0.05 % Apply 1 Application topically 2 (two) times a week. Apply to labia/vaginal area twice a  week. Once A Day on Mon, Fri; AS NEEDED   dapagliflozin propanediol (FARXIGA) 5 MG TABS tablet Take 5 mg by mouth daily.   dorzolamide-timolol (COSOPT) 22.3-6.8 MG/ML ophthalmic solution Place 1 drop into both eyes daily.  Wait at least 5 minutes between multiple drops in same eye.   ferrous sulfate 325 (65 FE) MG tablet Take 325 mg by mouth as directed. On Monday and Thursday   furosemide (LASIX) 20 MG tablet Take 20 mg by mouth daily.   Insulin Glargine (BASAGLAR KWIKPEN) 100 UNIT/ML Inject 28 Units into the skin at bedtime.   insulin lispro (HUMALOG KWIKPEN) 100 UNIT/ML KwikPen Inject 11 Units into the skin with breakfast, with lunch, and with evening meal.   latanoprost (XALATAN) 0.005 % ophthalmic solution Place 1 drop into both eyes daily.   loratadine (CLARITIN) 10 MG tablet Take 1 tablet (10 mg total) by mouth daily as needed for itching or rhinitis.   NON FORMULARY Diet:Regular   PRESCRIPTION MEDICATION Endit Skin Protectant Ointment; topical Special Instructions: Apply to vaginal/perineal area bid. Twice A Day   No facility-administered encounter medications on file as of 06/24/2023.     SIGNIFICANT DIAGNOSTIC EXAMS  PREVIOUS   09-11-22: hgb A1c 8.8 chol 124 ldl 56 trig 85 hdl 51 12-15-22: wbc 5.8; hgb 11.5; hct 36.6; mcv 94.1 plt 194; glucose 75; bun 53; creat 1.74; k+ 3.8; na++ 139; ca 8.5; gfr 29; hgb A1c 7.0; tsh 2.108  TODAY  06-15-23: wbc 5.7; hgb 12.3; hct 39.2; mcv 97.4 plt 169; glucose 160; bun 51; creat 1.81; k+ 3.6; na++ 139; ca 9.0; gfr 28; protein 6.3 albumin 3.2; chol 121; ldl 54; trig 128; hdl 41; tsh 2.150; hgb A1c 8.4   Review of Systems  Constitutional:  Negative for malaise/fatigue.  Respiratory:  Negative for cough and shortness of breath.   Cardiovascular:  Positive for leg swelling. Negative for chest pain and palpitations.  Gastrointestinal:  Negative for abdominal pain, constipation and heartburn.  Musculoskeletal:  Negative for back pain, joint pain and  myalgias.  Skin: Negative.   Neurological:  Negative for dizziness.  Psychiatric/Behavioral:  The patient is not nervous/anxious.     Physical Exam Constitutional:      General: She is not in acute distress.    Appearance: She is well-developed. She is obese. She is not diaphoretic.  Neck:     Thyroid: No thyromegaly.  Cardiovascular:     Rate and Rhythm: Normal rate and regular rhythm.     Heart sounds: Normal heart sounds.  Pulmonary:     Effort: Pulmonary effort is normal. No respiratory distress.     Breath sounds: Normal breath sounds.  Abdominal:     General: Bowel sounds are normal. There is no distension.     Palpations: Abdomen is soft.     Tenderness: There is no abdominal tenderness.  Musculoskeletal:        General: Normal range of motion.     Cervical back: Neck supple.     Right lower leg: Edema present.     Left lower leg: Edema present.     Comments: 3-4+ bilateral lower extremity edema   Lymphadenopathy:     Cervical: No cervical adenopathy.  Skin:    General: Skin is warm and dry.  Neurological:     Mental Status: She is alert. Mental status is at baseline.  Psychiatric:        Mood and Affect: Mood normal.     ASSESSMENT/ PLAN:  TODAY  Bilateral lower extremity edema She has 3-4+ bilateral lower extremity edema. She has gained 7 pounds to 215 pounds. Will begin her on lasix 40 mg daily with k+ 20 meq daily and will repeat labs in 2 weeks.     Britt Candle NP [facility] Adult Medicine   call [phone]

## 2023-06-25 ENCOUNTER — Non-Acute Institutional Stay (SKILLED_NURSING_FACILITY): Payer: Self-pay | Admitting: Adult Health

## 2023-06-25 ENCOUNTER — Encounter: Payer: Self-pay | Admitting: Adult Health

## 2023-06-25 DIAGNOSIS — Z0000 Encounter for general adult medical examination without abnormal findings: Secondary | ICD-10-CM

## 2023-06-25 NOTE — Patient Instructions (Signed)
  Ms. Peake , Thank you for taking time to come for your Medicare Wellness Visit. I appreciate your ongoing commitment to your health goals. Please review the following plan we discussed and let me know if I can assist you in the future.   These are the goals we discussed:  Goals      Absence of Fall and Fall-Related Injury     Evidence-based guidance:  Assess fall risk using a validated tool when available. Consider balance and gait impairment, muscle weakness, diminished vision or hearing, environmental hazards, presence of urinary or bowel urgency and/or incontinence.  Communicate fall injury risk to interprofessional healthcare team.  Develop a fall prevention plan with the patient and family.  Promote use of personal vision and auditory aids.  Promote reorientation, appropriate sensory stimulation, and routines to decrease risk of fall when changes in mental status are present.  Assess assistance level required for safe and effective self-care; consider referral for home care.  Encourage physical activity, such as performance of self-care at highest level of ability, strength and balance exercise program, and provision of appropriate assistive devices; refer to rehabilitation therapy.  Refer to community-based fall prevention program where available.  If fall occurs, determine the cause and revise fall injury prevention plan.  Regularly review medication contribution to fall risk; consider risk related to polypharmacy and age.  Refer to pharmacist for consultation when concerns about medications are revealed.  Balance adequate pain management with potential for oversedation.  Provide guidance related to environmental modifications.  Consider supplementation with Vitamin D.   Notes:      Follow up with Primary Care Provider     General - Client will not be readmitted within 30 days (C-[facility])        This is a list of the screening recommended for you and due dates:  Health  Maintenance  Topic Date Due   Yearly kidney health urinalysis for diabetes  07/06/2023*   Eye exam for diabetics  12/29/2023*   COVID-19 Vaccine (8 - 2024-25 season) 01/15/2024*   Flu Shot  10/09/2023   Hemoglobin A1C  12/15/2023   Yearly kidney function blood test for diabetes  06/14/2024   Complete foot exam   06/24/2024   Medicare Annual Wellness Visit  06/24/2024   DTaP/Tdap/Td vaccine (3 - Td or Tdap) 06/08/2031   Pneumonia Vaccine  Completed   DEXA scan (bone density measurement)  Completed   Zoster (Shingles) Vaccine  Completed   HPV Vaccine  Aged Out   Meningitis B Vaccine  Aged Out   Screening for Lung Cancer  Discontinued   Hepatitis C Screening  Discontinued  *Topic was postponed. The date shown is not the original due date.

## 2023-06-25 NOTE — Progress Notes (Signed)
 Subjective:   Paige Wilkins is a 78 y.o. female who presents for Medicare Annual (Subsequent) preventive examination.  Visit Complete: In person  Patient Medicare AWV questionnaire was completed by the patient on 06-25-2023; I have confirmed that all information answered by patient is correct and no changes since this date.  Cardiac Risk Factors include: advanced age (>71men, >76 women);diabetes mellitus;hypertension;obesity (BMI >30kg/m2);sedentary lifestyle     Objective:    Today's Vitals   06/25/23 1234  BP: (!) 110/55  Pulse: 64  Resp: 16  Temp: 98 F (36.7 C)  SpO2: 96%  Weight: 212 lb 9.6 oz (96.4 kg)  Height: 5\' 5"  (1.651 m)   Body mass index is 35.38 kg/m.     05/07/2023   11:31 AM 11/28/2022    8:11 AM 09/09/2022   10:51 AM 08/12/2022    9:18 AM 07/07/2022   11:52 AM 06/24/2022    8:25 AM 06/16/2022   10:09 AM  Advanced Directives  Does Patient Have a Medical Advance Directive? Yes Yes Yes Yes Yes Yes Yes  Type of Estate agent of [place];Living will;Out of facility DNR (pink MOST or yellow form) Out of facility DNR (pink MOST or yellow form);Healthcare Power of [employer] of [place];Out of facility DNR (pink MOST or yellow form) Out of facility DNR (pink MOST or yellow form) Out of facility DNR (pink MOST or yellow form) Out of facility DNR (pink MOST or yellow form) Out of facility DNR (pink MOST or yellow form)  Does patient want to make changes to medical advance directive? No - Patient declined Yes (Inpatient - patient defers changing a medical advance directive at this time - Information given) No - Patient declined No - Patient declined No - Patient declined No - Patient declined No - Patient declined  Copy of Healthcare Power of Attorney in Chart? Yes - validated most recent copy scanned in chart (See row information) Yes - validated most recent copy scanned in chart (See row information) Yes - validated most recent copy  scanned in chart (See row information)      Pre-existing out of facility DNR order (yellow form or pink MOST form) Yellow form placed in chart (order not valid for inpatient use)  Yellow form placed in chart (order not valid for inpatient use);Pink MOST form placed in chart (order not valid for inpatient use)  Pink MOST/Yellow Form most recent copy in chart - Physician notified to receive inpatient order;Yellow form placed in chart (order not valid for inpatient use) Pink MOST/Yellow Form most recent copy in chart - Physician notified to receive inpatient order;Yellow form placed in chart (order not valid for inpatient use) Pink MOST/Yellow Form most recent copy in chart - Physician notified to receive inpatient order;Yellow form placed in chart (order not valid for inpatient use)    Current Medications (verified) Outpatient Encounter Medications as of 06/25/2023  Medication Sig   acetaminophen (TYLENOL) 650 MG CR tablet Take 650 mg by mouth 3 (three) times daily.   Amino Acids-Protein Hydrolys (FEEDING SUPPLEMENT, PRO-STAT SUGAR FREE 64,) LIQD Take 30 mLs by mouth 2 (two) times daily.   aspirin 81 MG tablet Take 81 mg by mouth daily.   atorvastatin (LIPITOR) 20 MG tablet Take 20 mg by mouth daily.   calcium-vitamin D (OSCAL WITH D) 500-5 MG-MCG tablet Take 1 tablet by mouth daily.   clobetasol cream (TEMOVATE) 0.05 % Apply 1 Application topically 2 (two) times a week. Apply to labia/vaginal area twice  a week. Once A Day on Mon, Fri; AS NEEDED   dapagliflozin propanediol (FARXIGA) 5 MG TABS tablet Take 5 mg by mouth daily.   dorzolamide-timolol (COSOPT) 22.3-6.8 MG/ML ophthalmic solution Place 1 drop into both eyes daily.  Wait at least 5 minutes between multiple drops in same eye.   ferrous sulfate 325 (65 FE) MG tablet Take 325 mg by mouth as directed. On Monday and Thursday   furosemide (LASIX) 40 MG tablet Take 40 mg by mouth daily.   Insulin Glargine (BASAGLAR KWIKPEN) 100 UNIT/ML Inject 28  Units into the skin at bedtime.   insulin lispro (HUMALOG KWIKPEN) 100 UNIT/ML KwikPen Inject 11 Units into the skin with breakfast, with lunch, and with evening meal.   latanoprost (XALATAN) 0.005 % ophthalmic solution Place 1 drop into both eyes daily.   loratadine (CLARITIN) 10 MG tablet Take 1 tablet (10 mg total) by mouth daily as needed for itching or rhinitis.   NON FORMULARY Diet:Regular   potassium chloride SA (KLOR-CON M) 20 MEQ tablet Take 20 mEq by mouth daily.   PRESCRIPTION MEDICATION Endit Skin Protectant Ointment; topical Special Instructions: Apply to vaginal/perineal area bid. Twice A Day   No facility-administered encounter medications on file as of 06/25/2023.    Allergies (verified) Patient has no known allergies.   History: Past Medical History:  Diagnosis Date   CAD (coronary artery disease)    DM (diabetes mellitus)  with CKD    GFR 30   Glaucoma    HTN (hypertension)    Obesity    Osteoarthritis    Past Surgical History:  Procedure Laterality Date   CHOLECYSTECTOMY     CORONARY STENT PLACEMENT     x3   REFRACTIVE SURGERY     detached retina   TUBAL LIGATION     78 yrs old   Family History  Problem Relation Age of Onset   Stroke Father    Diabetes Sister    Hypertension Sister    Arthritis Other    Heart disease Other    Hypertension Other    Stroke Other    Kidney disease Other    Diabetes Other    Social History   Socioeconomic History   Marital status: Divorced    Spouse name: Not on file   Number of children: 2   Years of education: Not on file   Highest education level: Not on file  Occupational History   Occupation: retired  Tobacco Use   Smoking status: Former    Current packs/day: 1.00    Types: Cigarettes   Smokeless tobacco: Not on file   Tobacco comments:    She quit smoking on day of admission to [facility] .  Vaping Use   Vaping status: Not on file  Substance and Sexual Activity   Alcohol use: Yes    Comment:  rarely   Drug use: Never   Sexual activity: Not on file  Other Topics Concern   Not on file  Social History Narrative   Not on file   Social Drivers of Health   Financial Resource Strain: Unknown (05/03/2021)   Received from [facility] [employer], [facility]   Overall Financial Resource Strain (CARDIA)    Difficulty of Paying Living Expenses: Patient declined  Food Insecurity: Unknown (05/03/2021)   Received from [facility] [place], [facility]   Hunger Vital Sign    Worried About Running Out of Food in the  Last Year: Patient declined    Barista in the Last Year: Patient declined  Transportation Needs: Unknown (05/03/2021)   Received from [facility] [employer], [facility] [place]   [employer] - Administrator, Civil Service (Medical): Patient declined    Lack of Transportation (Non-Medical): Patient declined  Physical Activity: Not on file  Stress: Not on file  Social Connections: Unknown (05/06/2022)   Received from [facility], [facility]   Social Network    Social Network: Not on file    Tobacco Counseling Counseling given: Not Answered Tobacco comments: She quit smoking on day of admission to [facility] .   Clinical Intake:  Pre-visit preparation completed: Yes  Pain : No/denies pain     BMI - recorded: 35.38 Nutritional Status: BMI > 30  Obese Nutritional Risks: Unintentional weight loss Diabetes: Yes CBG done?: Yes CBG resulted in Enter/ Edit results?: Yes Did pt. bring in CBG monitor from home?: No  How often do you need to have someone help you when you read instructions, pamphlets, or other written materials from your doctor or pharmacy?: 5 - Always  Interpreter Needed?: No  Comments: long term resident of this facility   Activities of Daily Living    06/25/2023   12:37 PM  In your present state of health, do you have any difficulty performing the following activities:   Hearing? 0  Vision? 0  Difficulty concentrating or making decisions? 1  Walking or climbing stairs? 1  Dressing or bathing? 1  Doing errands, shopping? 1  Preparing Food and eating ? Y  Using the Toilet? Y  In the past six months, have you accidently leaked urine? Y  Do you have problems with loss of bowel control? Y  Managing your Medications? Y  Managing your Finances? Y  Housekeeping or managing your Housekeeping? Y    Patient Care Team: Sharee Holster, NP as PCP - [facility] (Geriatric Medicine[facility]  Indicate any recent Medical Services you may have received from other than [facility] providers in the past year (date may be approximate).     Assessment:   This is a routine wellness examination for D[place]avia.  Hearing/Vision screen No results found.   Goals Addressed             This Visit's Progress    Absence of Fall and Fall-Related Injury   On track    Evidence-based guidance:  Assess fall risk using a validated tool when available. Consider balance and gait impairment, muscle weakness, diminished vision or hearing, environmental hazards, presence of urinary or bowel urgency and/or incontinence.  Communicate fall injury risk to interprofessional healthcare team.  Develop a fall prevention plan with the patient and family.  Promote use of personal vision and auditory aids.  Promote reorientation, appropriate sensory stimulation, and routines to decrease risk of fall when changes in mental status are present.  Assess assistance level required for safe and effective self-care; consider referral for home care.  Encourage physical activity, such as performance of self-care at highest level of ability, strength and balance exercise program, and provision of appropriate assistive devices; refer to rehabilitation therapy.  Refer to community-based fall prevention program where available.  If fall occurs, determine the cause and revise  fall injury prevention plan.  Regularly review medication contribution to fall risk; consider risk related to polypharmacy and age.  Refer to pharmacist for consultation when concerns about medications are  revealed.  Balance adequate pain management with potential for oversedation.  Provide guidance related to environmental modifications.  Consider supplementation with Vitamin D.   Notes:      Follow up with Primary Care Provider   On track    General - Client will not be readmitted within 30 days ([facility])   On track      Depression Screen    06/25/2023   12:38 PM 03/17/2023   10:56 AM 06/16/2022    1:23 PM 06/09/2022   11:13 AM 04/10/2022   12:10 PM 08/28/2021   12:40 PM 06/10/2021    2:13 PM  PHQ 2/9 Scores  PHQ - 2 Score 0 0 0 0 0 0 0  PHQ- 9 Score  0   0 0     Fall Risk    06/25/2023   12:38 PM 03/17/2023   10:56 AM 06/16/2022    1:22 PM 06/09/2022   11:13 AM 05/13/2022   10:26 AM  Fall Risk   Falls in the past year? 1 0 1 0 1  Number falls in past yr: 0 0 1 0 0  Injury with Fall? 0 0 0 0 0  Risk for fall due to : History of fall(s);Impaired balance/gait;Impaired mobility History of fall(s);Impaired mobility;Impaired balance/gait History of fall(s);Impaired balance/gait;Impaired mobility History of fall(s);Impaired balance/gait;Impaired mobility History of fall(s);Impaired balance/gait;Impaired mobility  Follow up   Falls evaluation completed Falls evaluation completed     MEDICARE RISK AT HOME: Medicare Risk at Home Any stairs in or around the home?: Yes If so, are there any without handrails?: No Home free of loose throw rugs in walkways, pet beds, electrical cords, etc?: Yes Adequate lighting in your home to reduce risk of falls?: Yes Life alert?: No Use of a cane, walker or w/c?: Yes Grab bars in the bathroom?: Yes Shower chair or bench in shower?: Yes Elevated toilet seat or a handicapped toilet?: Yes  TIMED UP AND GO:  Was the test performed?  No    Cognitive  Function:    06/25/2023   12:39 PM 06/16/2022    1:24 PM 06/10/2021    2:14 PM  MMSE - Mini Mental State Exam  Not completed: Unable to complete  Unable to complete  Orientation to time  3   Orientation to Place  1   Registration  3   Attention/ Calculation  1   Recall  1   Language- name 2 objects  2   Language- repeat  1   Language- follow 3 step command  1   Language- read & follow direction  1   Write a sentence  0   Copy design  0   Total score  14         06/16/2022    1:25 PM  6CIT Screen  What Year? 0 points  What month? 0 points  What time? 0 points  Count back from 20 4 points  Months in reverse 4 points  Repeat phrase 4 points  Total Score 12 points    Immunizations Immunization History  Administered Date(s) Administered   Fluad Quad(high Dose 65+) 12/10/2021, 12/16/2022   Influenza, High Dose Seasonal PF 12/20/2019   Influenza,inj,Quad PF,6+ Mos 01/15/2021   Influenza,inj,quad, With Preservative 01/09/2015   Moderna Covid-19 Vaccine Bivalent Booster 4yrs & up 07/24/2020   Moderna Sars-Covid-2 Vaccination 12/16/2022   PFIZER(Purple Top)SARS-COV-2 Vaccination 04/28/2019, 05/18/2019   PNEUMOCOCCAL CONJUGATE-20 07/12/2021, 08/23/2021   Pfizer Covid-19 Vaccine Bivalent Booster 68yrs & up 02/27/2020, 07/30/2021,  01/01/2022   Pneumococcal Conjugate,unspecified 01/09/2015   Pneumococcal-Unspecified 01/09/2015   Rsv, Bivalent, Protein Subunit Rsvpref,pf Pattricia Bores) 02/10/2022   Tdap 07/05/2015, 06/07/2021   Zoster Recombinant(Shingrix) 07/05/2021, 10/02/2021    TDAP status: Up to date  Flu Vaccine status: Up to date  Pneumococcal vaccine status: Up to date  Covid-19 vaccine status: Completed vaccines  Qualifies for Shingles Vaccine? No   Zostavax completed Yes     Screening Tests Health Maintenance  Topic Date Due   FOOT EXAM  04/11/2023   Diabetic kidney evaluation - Urine ACR  04/15/2023   Lung Cancer Screening  05/13/2023   Medicare Annual  Wellness (AWV)  06/16/2023   OPHTHALMOLOGY EXAM  12/29/2023 (Originally 03/21/1955)   COVID-19 Vaccine (8 - 2024-25 season) 01/15/2024 (Originally 02/10/2023)   INFLUENZA VACCINE  10/09/2023   HEMOGLOBIN A1C  12/15/2023   Diabetic kidney evaluation - eGFR measurement  06/14/2024   DTaP/Tdap/Td (3 - Td or Tdap) 06/08/2031   Pneumonia Vaccine 79+ Years old  Completed   DEXA SCAN  Completed   Zoster Vaccines- Shingrix  Completed   HPV VACCINES  Aged Out   Meningococcal B Vaccine  Aged Out   Hepatitis C Screening  Discontinued    Health Maintenance  Health Maintenance Due  Topic Date Due   FOOT EXAM  04/11/2023   Diabetic kidney evaluation - Urine ACR  04/15/2023   Lung Cancer Screening  05/13/2023   Medicare Annual Wellness (AWV)  06/16/2023    Colorectal cancer screening: No longer required.   Mammogram status: No longer required due to age.  Bone Density status: Completed 2023. Results reflect: Bone density results: OSTEOPENIA. Repeat every   years.  Lung Cancer Screening: (Low Dose CT Chest recommended if Age 24-80 years, 20 pack-year currently smoking OR have quit w/in 15years.) does not qualify.   Lung Cancer Screening Referral:   Additional Screening:  Hepatitis C Screening: does not qualify; Completed   Vision Screening: Recommended annual ophthalmology exams for early detection of glaucoma and other disorders of the eye. Is the patient up to date with their annual eye exam?  No  Who is the provider or what is the name of the office in which the patient attends annual eye exams?  If pt is not established with a provider, would they like to be referred to a provider to establish care? No .   Dental Screening: Recommended annual dental exams for proper oral hygiene  Diabetic Foot Exam: Diabetic Foot Exam: Completed [id number]  Community Resource Referral / Chronic Care Management: CRR required this visit?  No   CCM required this visit?  No     Plan:     I have  personally reviewed and noted the following in the patient's chart:   Medical and social history Use of alcohol, tobacco or illicit drugs  Current medications and supplements including opioid prescriptions. Patient is not currently taking opioid prescriptions. Functional ability and status Nutritional status Physical activity Advanced directives List of other physicians Hospitalizations, surgeries, and ER visits in previous 12 months Vitals Screenings to include cognitive, depression, and falls Referrals and appointments  In addition, I have reviewed and discussed with patient certain preventive protocols, quality metrics, and best practice recommendations. A written personalized care plan for preventive services as well as general preventive health recommendations were provided to patient.     Marilyne Shu, NP   06/25/2023   After Visit Summary:   Nurse Notes: this exam was performed by myself at this facility

## 2023-06-29 ENCOUNTER — Other Ambulatory Visit (HOSPITAL_COMMUNITY)
Admission: RE | Admit: 2023-06-29 | Discharge: 2023-06-29 | Disposition: A | Discharge status: Home / Self Care | Source: Skilled Nursing Facility | Attending: Adult Health | Admitting: Adult Health

## 2023-06-29 DIAGNOSIS — E119 Type 2 diabetes mellitus without complications: Secondary | ICD-10-CM | POA: Insufficient documentation

## 2023-06-30 LAB — MICROALBUMIN / CREATININE URINE RATIO
Creatinine, Urine: 40.7 mg/dL
Microalb Creat Ratio: 44 mg/g{creat} — ABNORMAL HIGH (ref 0–29)
Microalb, Ur: 17.9 ug/mL — ABNORMAL HIGH

## 2023-07-08 ENCOUNTER — Other Ambulatory Visit (HOSPITAL_COMMUNITY)
Admission: RE | Admit: 2023-07-08 | Discharge: 2023-07-08 | Disposition: A | Discharge status: Home / Self Care | Source: Skilled Nursing Facility | Attending: Adult Health | Admitting: Adult Health

## 2023-07-08 DIAGNOSIS — I1310 Hypertensive heart and chronic kidney disease without heart failure, with stage 1 through stage 4 chronic kidney disease, or unspecified chronic kidney disease: Secondary | ICD-10-CM | POA: Diagnosis not present

## 2023-07-08 LAB — BASIC METABOLIC PANEL WITH GFR
Anion gap: 10 (ref 5–15)
BUN: 45 mg/dL — ABNORMAL HIGH (ref 8–23)
CO2: 25 mmol/L (ref 22–32)
Calcium: 8.9 mg/dL (ref 8.9–10.3)
Chloride: 104 mmol/L (ref 98–111)
Creatinine, Ser: 2.01 mg/dL — ABNORMAL HIGH (ref 0.44–1.00)
GFR, Estimated: 25 mL/min — ABNORMAL LOW (ref >60)
Glucose, Bld: 153 mg/dL — ABNORMAL HIGH (ref 70–99)
Potassium: 3.7 mmol/L (ref 3.5–5.1)
Sodium: 139 mmol/L (ref 135–145)

## 2023-07-14 ENCOUNTER — Non-Acute Institutional Stay (SKILLED_NURSING_FACILITY): Payer: Self-pay | Admitting: Internal Medicine

## 2023-07-14 ENCOUNTER — Encounter: Payer: Self-pay | Admitting: Internal Medicine

## 2023-07-14 DIAGNOSIS — N183 Chronic kidney disease, stage 3 unspecified: Secondary | ICD-10-CM | POA: Diagnosis not present

## 2023-07-14 DIAGNOSIS — R7989 Other specified abnormal findings of blood chemistry: Secondary | ICD-10-CM | POA: Diagnosis not present

## 2023-07-14 DIAGNOSIS — I10 Essential (primary) hypertension: Secondary | ICD-10-CM

## 2023-07-14 DIAGNOSIS — E1351 Other specified diabetes mellitus with diabetic peripheral angiopathy without gangrene: Secondary | ICD-10-CM | POA: Insufficient documentation

## 2023-07-14 NOTE — Assessment & Plan Note (Signed)
 Creatinine has risen from 1.81 up to 2.01 with a corresponding drop in the estimated GFR from 28 down to 25.  This is compatible with stage IV CKD.  Med list reviewed; no indication for change in meds.

## 2023-07-14 NOTE — Progress Notes (Unsigned)
 NURSING HOME LOCATION:  [facility] ROOM NUMBER:  [id number]  CODE STATUS:  DNR  PCP: Marilyne Shu, NP   This is a nursing facility follow up visit  of chronic medical diagnoses to document compliance with Regulation 483.30 (c) in The Long Term Care Survey Manual Phase 2 which mandates caregiver visit ( visits can alternate among physician, PA or NP as per statutes) within 10 days of 30 days / 60 days/ 90 days post admission to SNF date  .  Interim medical record and care since last SNF visit was updated with review of diagnostic studies and change in clinical status since last visit were documented.  HPI: She is a permanent resident of this facility with medical diagnoses of CAD, diabetes with CKD, hypertension,& dementia.She has had coronary artery stenting.  Labs are current as of 07/08/2023.Aaron Aas  There has been some progression in her CKD with creatinine rising from 1.81 up to 2.01.  There is corresponding drop in the estimated GFR from 28 down to 25.  This is compatible with stage IV CKD. A1c has risen from 7% up to 8.4%; she is on 23 units of basal insulin and 11 units of short acting insulin before meals.  Glucoses have ranged from a low of 108 up to high of 315.  The majority are in the range of 200-300.  This is in the context of significant weight gain as per staff.  No hypoglycemia has been reported. TSH is therapeutic @ 2.150. Protein/caloric malnutrition is present with a total protein 6.3 and albumin of 3.2. Anemia has resolved ; current H/H  12.3/38.2. Staff reports that she is doing well but has significant peripheral edema.  Because of this her furosemide was increased as well as supplemental potassium recently.  Review of systems: Dementia invalidated responses.  She stated that she is "fine" and then laughed.  When asked how her diabetes was doing, she responded "I am not checking my sugars."  She stated "but I am taking my pills."  She denies any active  symptoms with special focus on diabetic related symptoms or cardiovascular symptoms in the context of the peripheral edema.  Constitutional: No fever, significant weight change, fatigue  Eyes: No redness, discharge, pain, vision change ENT/mouth: No nasal congestion,  purulent discharge, earache, change in hearing, sore throat  Cardiovascular: No chest pain, palpitations, paroxysmal nocturnal dyspnea  Respiratory: No cough, sputum production, hemoptysis, DOE, significant snoring, apnea   Gastrointestinal: No heartburn, dysphagia, abdominal pain, nausea /vomiting, rectal bleeding, melena, change in bowels Genitourinary: No dysuria, hematuria, pyuria, incontinence, nocturia Musculoskeletal: No joint stiffness, joint swelling, weakness, pain Dermatologic: No rash, pruritus, change in appearance of skin Neurologic: No dizziness, headache, syncope, seizures, numbness, tingling Psychiatric: No significant anxiety, depression, insomnia, anorexia Endocrine: No change in hair/skin/nails, excessive thirst, excessive hunger, excessive urination  Hematologic/lymphatic: No significant bruising, lymphadenopathy, abnormal bleeding Allergy /immunology: No itchy/watery eyes, significant sneezing, urticaria, angioedema  Physical exam:  Pertinent or positive findings: She is bed or wheelchair bound. Lower lids are puffy.  This is asymmetric, greater on the left.  She is edentulous.  Grade 1/2 systolic murmur is present at the right base. Central obesity is present.  Pedal pulses are not palpable.  Visible edema is present despite compression hose.  There is 1/2+ pitting at the compression sock line.  General appearance: Adequately nourished; no acute distress, increased work of breathing is present.   Lymphatic: No lymphadenopathy about the head, neck, axilla. Eyes: No conjunctival  inflammation or lid edema is present. There is no scleral icterus. Ears:  External ear exam shows no significant lesions or  deformities.   Nose:  External nasal examination shows no deformity or inflammation. Nasal mucosa are pink and moist without lesions, exudates Oral exam:  Lips and gums are healthy appearing. There is no oropharyngeal erythema or exudate. Neck:  No thyromegaly, masses, tenderness noted.    Heart:  Normal rate and regular rhythm. S1 and S2 normal without gallop, click, rub .  Lungs: Chest clear to auscultation without wheezes, rhonchi, rales, rubs. Abdomen: Bowel sounds are normal. Abdomen is soft and nontender with no organomegaly, hernias, masses. GU: Deferred  Extremities:  No cyanosis, clubbing  Neurologic exam :Balance, Rhomberg, finger to nose testing could not be completed due to clinical state Skin: Warm & dry w/o tenting. No significant lesions or rash.  See summary under each active problem in the Problem List with associated updated therapeutic plan

## 2023-07-14 NOTE — Assessment & Plan Note (Signed)
 A1c has risen from 7% up to 8.4.  She is on 23 units of basal insulin and 11 units of short acting insulin before meals.  Glucoses ranged from the low 108 up to high at 315.  Majority are in the range of 200-300.  The hypoglycemia is in the context of significant weight gain as per staff.  No hypoglycemia reported.  A1c goal is less than 8%.  Basal insulin will be increased to 26 units.

## 2023-07-14 NOTE — Assessment & Plan Note (Signed)
 Serial blood pressures document adequate control; today's slight systolic hypertension is an outlier.  Continue to monitor.

## 2023-07-16 NOTE — Assessment & Plan Note (Signed)
 Despite reported weight gain, serially TSH is therapeutic & stable. No change, continue monitor.

## 2023-07-16 NOTE — Patient Instructions (Signed)
 See assessment and plan under each diagnosis in the problem list and acutely for this visit:  CKD (chronic kidney disease) stage 3, GFR 30-59 ml/min (HCC) Creatinine has risen from 1.81 up to 2.01 with a corresponding drop in the estimated GFR from 28 down to 25.  This is compatible with stage IV CKD.  Med list reviewed; no indication for change in meds.  DM (diabetes mellitus), secondary, with peripheral vascular complications (HCC) A1c has risen from 7% up to 8.4.  She is on 23 units of basal insulin and 11 units of short acting insulin before meals.  Glucoses range from the low of 108 up to high at 315.  Majority are in the range of 200-300.  This is in context of significant weight gain as per staff.  No hypoglycemia reported.  A1c goal is less than 8%.  Basal insulin will be increased to 26 units.  Essential hypertension Serial blood pressures document adequate control; today's slight systolic hypertension is an outlier.  Continue to monitor.  Abnormal TSH Despite reported weight gain, serially TSH is therapeutic & stable. No change, continue monitor.

## 2023-08-06 ENCOUNTER — Encounter: Payer: Self-pay | Admitting: Adult Health

## 2023-08-06 NOTE — Progress Notes (Unsigned)
 Location:  [facility] Room Number: 101 Place of Service:  SNF (31)   CODE STATUS: dnr   No Known Allergies  Chief Complaint  Patient presents with   Acute Visit    Care plan meeting.     HPI:  We have come together for her care plan meeting. Family present  BIMS  6/15 mood. 3/30: decreased energy. She uses wheelchair without falls. She requires max to dependent assist with her adl care. She is incontinent of bladder and bowel. Dietary: setup for meals; appetite 76-100%; regular diet weight is 207.2 pounds; on protein supplements. Therapy: none at this time. Activities: does participate.  She will continue to be followed for her chronic illnesses including: Aortic arch atherosclerosis   Stage 3 chronic kidney disease due to type 2 diabetes mellitus   Dementia without behavioral disturbance   Depression recurrent.   Past Medical History:  Diagnosis Date   CAD (coronary artery disease)    DM (diabetes mellitus)  with CKD    GFR 30   Glaucoma    HTN (hypertension)    Obesity    Osteoarthritis     Past Surgical History:  Procedure Laterality Date   CHOLECYSTECTOMY     CORONARY STENT PLACEMENT     x3   REFRACTIVE SURGERY     detached retina   TUBAL LIGATION     78 yrs old    Social History   Socioeconomic History   Marital status: Divorced    Spouse name: Not on file   Number of children: 2   Years of education: Not on file   Highest education level: Not on file  Occupational History   Occupation: retired  Tobacco Use   Smoking status: Former    Current packs/day: 1.00    Types: Cigarettes   Smokeless tobacco: Not on file   Tobacco comments:    She quit smoking on day of admission to [facility] .  Vaping Use   Vaping status: Not on file  Substance and Sexual Activity   Alcohol use: Yes    Comment: rarely   Drug use: Never   Sexual activity: Not on file  Other Topics Concern   Not on file  Social History Narrative   Not on file    Social Drivers of Health   Financial Resource Strain: Unknown (05/03/2021)   Received [place] [facility] [place], [facility] [place]   Overall Financial Resource Strain (CARDIA)    Difficulty of Paying Living Expenses: Patient declined  Food Insecurity: Unknown (05/03/2021)   Received from [facility] [place], [facility] [employer] Vital Sign    Worried About Running Out of Food in the Last Year: Patient declined    Barista in the Last Year: Patient declined  Transportation Needs: Unknown (05/03/2021)   Received from [facility] [employer], [facility] [place]   [employer] - Administrator, Civil Service (Medical): Patient declined    Lack of Transportation (Non-Medical): Patient declined  Physical Activity: Not on file  Stress: Not on file  Social Connections: Unknown (05/06/2022)   Received from [facility], [facility]   Social Network    Social Network: Not on file  Intimate Partner Violence: Unknown (05/06/2022)   Received from [facility], [facility]   HITS    Physically Hurt: Not on file    Insult or Talk Down To:  Not on file    Threaten Physical Harm: Not on file    Scream or Curse: Not on file   Family History  Problem Relation Age of Onset   Stroke Father    Diabetes Sister    Hypertension Sister    Arthritis Other    Heart disease Other    Hypertension Other    Stroke Other    Kidney disease Other    Diabetes Other       VITAL SIGNS BP (!) 142/74   Pulse 70   Temp 98.5 F (36.9 C)   Resp 20   Ht 5\' 6"  (1.676 m)   Wt 224 lb 12.8 oz (102 kg)   SpO2 97%   BMI 36.28 kg/m   Outpatient Encounter Medications as of 08/07/2023  Medication Sig   acetaminophen (TYLENOL) 650 MG CR tablet Take 650 mg by mouth 3 (three) times daily.   Amino Acids-Protein Hydrolys (FEEDING SUPPLEMENT, PRO-STAT SUGAR FREE 64,) LIQD Take 30 mLs by mouth 2 (two) times daily.   aspirin 81 MG tablet Take 81  mg by mouth daily.   atorvastatin (LIPITOR) 20 MG tablet Take 20 mg by mouth daily.   calcium-vitamin D (OSCAL WITH D) 500-5 MG-MCG tablet Take 1 tablet by mouth daily.   clobetasol cream (TEMOVATE) 0.05 % Apply 1 Application topically 2 (two) times a week. Apply to labia/vaginal area twice a week. Once A Day on Mon, Fri; AS NEEDED   dapagliflozin propanediol (FARXIGA) 5 MG TABS tablet Take 5 mg by mouth daily.   dorzolamide-timolol (COSOPT) 22.3-6.8 MG/ML ophthalmic solution Place 1 drop into both eyes daily.  Wait at least 5 minutes between multiple drops in same eye.   ferrous sulfate 325 (65 FE) MG tablet Take 325 mg by mouth as directed. On Monday and Thursday   furosemide (LASIX) 40 MG tablet Take 40 mg by mouth daily.   Insulin Glargine (BASAGLAR KWIKPEN) 100 UNIT/ML Inject 28 Units into the skin at bedtime.   insulin lispro (HUMALOG KWIKPEN) 100 UNIT/ML KwikPen Inject 11 Units into the skin with breakfast, with lunch, and with evening meal.   latanoprost (XALATAN) 0.005 % ophthalmic solution Place 1 drop into both eyes daily.   loratadine  (CLARITIN ) 10 MG tablet Take 1 tablet (10 mg total) by mouth daily as needed for itching or rhinitis.   NON FORMULARY Diet:Regular   potassium chloride SA (KLOR-CON M) 20 MEQ tablet Take 20 mEq by mouth daily.   PRESCRIPTION MEDICATION Endit Skin Protectant Ointment; topical Special Instructions: Apply to vaginal/perineal area bid. Twice A Day   No facility-administered encounter medications on file as of 08/07/2023.     SIGNIFICANT DIAGNOSTIC EXAMS  PREVIOUS   09-11-22: hgb A1c 8.8 chol 124 ldl 56 trig 85 hdl 51 12-15-22: wbc 5.8; hgb 11.5; hct 36.6; mcv 94.1 plt 194; glucose 75; bun 53; creat 1.74; k+ 3.8; na++ 139; ca 8.5; gfr 29; hgb A1c 7.0; tsh 2.108 06-15-23: wbc 5.7; hgb 12.3; hct 39.2; mcv 97.4 plt 169; glucose 160; bun 51; creat 1.81; k+ 3.6; na++ 139; ca 9.0; gfr 28; protein 6.3 albumin 3.2; chol 121; ldl 54; trig 128; hdl 41; tsh 2.150;  hgb A1c 8.4  NO NEW LABS.    Review of Systems  Constitutional:  Negative for malaise/fatigue.  Respiratory:  Negative for cough and shortness of breath.   Cardiovascular:  Negative for chest pain, palpitations and leg swelling.  Gastrointestinal:  Negative for abdominal pain, constipation and heartburn.  Musculoskeletal:  Negative  for back pain, joint pain and myalgias.  Skin: Negative.   Neurological:  Negative for dizziness.  Psychiatric/Behavioral:  The patient is not nervous/anxious.    Physical Exam Constitutional:      General: She is not in acute distress.    Appearance: She is well-developed. She is obese. She is not diaphoretic.  Neck:     Thyroid : No thyromegaly.  Cardiovascular:     Rate and Rhythm: Normal rate and regular rhythm.     Pulses: Normal pulses.     Heart sounds: Normal heart sounds.  Pulmonary:     Effort: Pulmonary effort is normal. No respiratory distress.     Breath sounds: Normal breath sounds.  Abdominal:     General: Bowel sounds are normal. There is no distension.     Palpations: Abdomen is soft.     Tenderness: There is no abdominal tenderness.  Musculoskeletal:        General: Normal range of motion.     Cervical back: Neck supple.     Right lower leg: No edema.     Left lower leg: No edema.  Lymphadenopathy:     Cervical: No cervical adenopathy.  Skin:    General: Skin is warm and dry.  Neurological:     Mental Status: She is alert. Mental status is at baseline.  Psychiatric:        Mood and Affect: Mood normal.     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   Will continue current medications Will continue current plan of care Will continue to monitor her status.   Time spent with patient: 40 minutes: medications; plan of care; dietary    Britt Candle NP [facility]   call [phone]

## 2023-08-07 ENCOUNTER — Non-Acute Institutional Stay (SKILLED_NURSING_FACILITY): Payer: Self-pay | Admitting: Adult Health

## 2023-08-07 DIAGNOSIS — E1122 Type 2 diabetes mellitus with diabetic chronic kidney disease: Secondary | ICD-10-CM | POA: Diagnosis not present

## 2023-08-07 DIAGNOSIS — F339 Major depressive disorder, recurrent, unspecified: Secondary | ICD-10-CM

## 2023-08-07 DIAGNOSIS — I700 Atherosclerosis of aorta: Secondary | ICD-10-CM

## 2023-08-07 DIAGNOSIS — N183 Chronic kidney disease, stage 3 unspecified: Secondary | ICD-10-CM

## 2023-08-07 DIAGNOSIS — F0390 Unspecified dementia without behavioral disturbance: Secondary | ICD-10-CM

## 2023-08-11 ENCOUNTER — Non-Acute Institutional Stay (SKILLED_NURSING_FACILITY): Payer: Self-pay | Admitting: Adult Health

## 2023-08-11 ENCOUNTER — Encounter: Payer: Self-pay | Admitting: Adult Health

## 2023-08-11 DIAGNOSIS — Z794 Long term (current) use of insulin: Secondary | ICD-10-CM

## 2023-08-11 DIAGNOSIS — E785 Hyperlipidemia, unspecified: Secondary | ICD-10-CM

## 2023-08-11 DIAGNOSIS — E1122 Type 2 diabetes mellitus with diabetic chronic kidney disease: Secondary | ICD-10-CM

## 2023-08-11 DIAGNOSIS — N1832 Chronic kidney disease, stage 3b: Secondary | ICD-10-CM | POA: Diagnosis not present

## 2023-08-11 DIAGNOSIS — E1169 Type 2 diabetes mellitus with other specified complication: Secondary | ICD-10-CM | POA: Diagnosis not present

## 2023-08-11 DIAGNOSIS — D631 Anemia in chronic kidney disease: Secondary | ICD-10-CM

## 2023-08-11 NOTE — Progress Notes (Signed)
 Location:  [facility] Room Number: 101 Place of Service:  SNF (31)   CODE STATUS: dnr   No Known Allergies  Chief Complaint  Patient presents with   Medical Management of Chronic Issues            Type 2 diabetes mellitus with stage 3 chronic kidney disease with long term current use of insulin:  Hyperlipidemia: associated with type 2 diabetes mellitus:  Anemia due to stage 3 chronic kidney disease    HPI:  She is a 78 y.o. long term resident of this facility being seen for the management of her chronic illnesses:Type 2 diabetes mellitus with stage 3 chronic kidney disease with long term current use of insulin:  Hyperlipidemia: associated with type 2 diabetes mellitus:  Anemia due to stage 3 chronic kidney disease. She does have an occasional cough per staff; she denies any coughing. There are no reports of uncontrolled pain. Her CBG readings are improving in the AM in the PM they remain elevated.    Past Medical History:  Diagnosis Date   CAD (coronary artery disease)    DM (diabetes mellitus)  with CKD    GFR 30   Glaucoma    HTN (hypertension)    Obesity    Osteoarthritis     Past Surgical History:  Procedure Laterality Date   CHOLECYSTECTOMY     CORONARY STENT PLACEMENT     x3   REFRACTIVE SURGERY     detached retina   TUBAL LIGATION     78 yrs old    Social History   Socioeconomic History   Marital status: Divorced    Spouse name: Not on file   Number of children: 2   Years of education: Not on file   Highest education level: Not on file  Occupational History   Occupation: retired  Tobacco Use   Smoking status: Former    Current packs/day: 1.00    Types: Cigarettes   Smokeless tobacco: Not on file   Tobacco comments:    She quit smoking on day of admission to [facility] .  Vaping Use   Vaping status: Not on file  Substance and Sexual Activity   Alcohol use: Yes    Comment: rarely   Drug use: Never   Sexual activity: Not on  file  Other Topics Concern   Not on file  Social History Narrative   Not on file   Social Drivers of Health   Financial Resource Strain: Unknown (05/03/2021)   Received from [facility] [place], [facility] [place]   Overall Financial Resource Strain (CARDIA)    Difficulty of Paying Living Expenses: Patient declined  Food Insecurity: Unknown (05/03/2021)   Received from [facility] [place], [facility] [place]   Hunger Vital Sign    Worried About Running Out of Food in the Last Year: Patient declined    Barista in the Last Year: Patient declined  Transportation Needs: Unknown (05/03/2021)   Received from [facility] [employer] [facility], [facility] [place]   [employer] - Administrator, Civil Service (Medical): Patient declined    Lack of Transportation (Non-Medical): Patient declined  Physical Activity: Not on file  Stress: Not on file  Social Connections: Unknown (05/06/2022)   Received from [facility], [facility]   Social Network    Social Network: Not on file  Intimate Partner Violence: Unknown (05/06/2022)  Received from [facility], [facility]   HITS    Physically Hurt: Not on file    Insult or Talk Down To: Not on file    Threaten Physical Harm: Not on file    Scream or Curse: Not on file   Family History  Problem Relation Age of Onset   Stroke Father    Diabetes Sister    Hypertension Sister    Arthritis Other    Heart disease Other    Hypertension Other    Stroke Other    Kidney disease Other    Diabetes Other       VITAL SIGNS BP (!) 141/62   Pulse 66   Temp 98 F (36.7 C)   Resp 18   Ht 5\' 5"  (1.651 m)   Wt 208 lb (94.3 kg)   SpO2 95%   BMI 34.61 kg/m   Outpatient Encounter Medications as of 08/11/2023  Medication Sig   acetaminophen (TYLENOL) 650 MG CR tablet Take 650 mg by mouth 3 (three) times daily.   Amino Acids-Protein Hydrolys (FEEDING SUPPLEMENT, PRO-STAT SUGAR FREE 64,) LIQD  Take 30 mLs by mouth 2 (two) times daily.   aspirin 81 MG tablet Take 81 mg by mouth daily.   atorvastatin (LIPITOR) 20 MG tablet Take 20 mg by mouth daily.   calcium-vitamin D (OSCAL WITH D) 500-5 MG-MCG tablet Take 1 tablet by mouth daily.   clobetasol cream (TEMOVATE) 0.05 % Apply 1 Application topically 2 (two) times a week. Apply to labia/vaginal area twice a week. Once A Day on Mon, Fri; AS NEEDED   dapagliflozin propanediol (FARXIGA) 5 MG TABS tablet Take 5 mg by mouth daily.   dorzolamide-timolol (COSOPT) 22.3-6.8 MG/ML ophthalmic solution Place 1 drop into both eyes daily.  Wait at least 5 minutes between multiple drops in same eye.   ferrous sulfate 325 (65 FE) MG tablet Take 325 mg by mouth as directed. On Monday and Thursday   furosemide (LASIX) 40 MG tablet Take 40 mg by mouth daily.   Insulin Glargine (BASAGLAR KWIKPEN) 100 UNIT/ML Inject 28 Units into the skin at bedtime.   insulin lispro (HUMALOG KWIKPEN) 100 UNIT/ML KwikPen Inject 11 Units into the skin with breakfast, with lunch, and with evening meal.   latanoprost (XALATAN) 0.005 % ophthalmic solution Place 1 drop into both eyes daily.   loratadine  (CLARITIN ) 10 MG tablet Take 1 tablet (10 mg total) by mouth daily as needed for itching or rhinitis.   NON FORMULARY Diet:Regular   potassium chloride SA (KLOR-CON M) 20 MEQ tablet Take 20 mEq by mouth daily.   PRESCRIPTION MEDICATION Endit Skin Protectant Ointment; topical Special Instructions: Apply to vaginal/perineal area bid. Twice A Day   No facility-administered encounter medications on file as of 08/11/2023.     SIGNIFICANT DIAGNOSTIC EXAMS  PREVIOUS   09-11-22: hgb A1c 8.8 chol 124 ldl 56 trig 85 hdl 51 12-15-22: wbc 5.8; hgb 11.5; hct 36.6; mcv 94.1 plt 194; glucose 75; bun 53; creat 1.74; k+ 3.8; na++ 139; ca 8.5; gfr 29; hgb A1c 7.0; tsh 2.108  TODAY  06-15-23: wbc 5.7; hgb 12.3; hct 39.2; mcv 97.4 plt 169; glucose 160; bun 51; creat 1.81; k+ 3.6; na++ 139; ca  9.0; gfr 28; protein 6.3 albumin 3.2; chol 121; ldl 54; trig 128; hdl 41; tsh 2.150; hgb A1c 8.4  06-29-23: urine micro-albumin 17.9 07-08-23: glucose 153; bun 45; creat 2.01; k+ 3.7; na++ 139; ca 8.9; gfr 25  Review of Systems  Constitutional:  Negative for malaise/fatigue.  Respiratory:  Negative for cough and shortness of breath.   Cardiovascular:  Negative for chest pain, palpitations and leg swelling.  Gastrointestinal:  Negative for abdominal pain, constipation and heartburn.  Musculoskeletal:  Negative for back pain, joint pain and myalgias.  Skin: Negative.   Neurological:  Negative for dizziness.  Psychiatric/Behavioral:  The patient is not nervous/anxious.    Physical Exam Constitutional:      General: She is not in acute distress.    Appearance: She is well-developed. She is obese. She is not diaphoretic.  Neck:     Thyroid : No thyromegaly.  Cardiovascular:     Rate and Rhythm: Normal rate and regular rhythm.     Pulses: Normal pulses.     Heart sounds: Normal heart sounds.  Pulmonary:     Effort: Pulmonary effort is normal. No respiratory distress.     Breath sounds: Normal breath sounds.  Abdominal:     General: Bowel sounds are normal. There is no distension.     Palpations: Abdomen is soft.     Tenderness: There is no abdominal tenderness.  Musculoskeletal:        General: Normal range of motion.     Cervical back: Neck supple.     Right lower leg: No edema.     Left lower leg: No edema.  Lymphadenopathy:     Cervical: No cervical adenopathy.  Skin:    General: Skin is warm and dry.  Neurological:     Mental Status: She is alert. Mental status is at baseline.  Psychiatric:        Mood and Affect: Mood normal.      ASSESSMENT/ PLAN:  TODAY  Type 2 diabetes mellitus with stage 3 chronic kidney disease with long term current use of insulin: hgb A1c 8.4; will continue farxiga 5 mg daily; basaglar 28 units nightly; will change to humalog to: 14 units with  meals  and will monitor her status. Her AM average is 128; PM average is 227.   Hyperlipidemia: associated with type 2 diabetes mellitus: ldl 54 will continue lipitor 20 mg daily   3. Anemia due to stage 3 chronic kidney disease: hgb 12.3 will continue iron twice weekly    PREVIOUS   4. Increased intraocular pressure bilateral: will continue cosopt and xalatan to both eyes.   5. Dementia without behavioral disturbance; weight is 208 pounds;   6. Protein calorie malnutrition: protein 6.3 albumin 3.2 will continue supplements as directed.   7. Chronic depression: is off remeron due to history of falls  8. Hypertension associated with stage 3a chronic kidney disease due to type 2 diabetes mellitus: b/p 141/62  9. Aortic arch atherosclerosis: (cxr 04-13-09) is on statin.  10. Post menopausal osteopenia: t score -2.561; will monitor   11.  Stage 3 chronic kidney disease due to type 2 diabetes mellitis bun 45; creat 2.05 gfr 25;     Britt Candle NP [facility]  call [phone]

## 2023-09-08 ENCOUNTER — Encounter: Payer: Self-pay | Admitting: Adult Health

## 2023-09-08 ENCOUNTER — Non-Acute Institutional Stay (SKILLED_NURSING_FACILITY): Payer: Self-pay | Admitting: Adult Health

## 2023-09-08 DIAGNOSIS — F0390 Unspecified dementia without behavioral disturbance: Secondary | ICD-10-CM | POA: Diagnosis not present

## 2023-09-08 DIAGNOSIS — E440 Moderate protein-calorie malnutrition: Secondary | ICD-10-CM

## 2023-09-08 DIAGNOSIS — H40053 Ocular hypertension, bilateral: Secondary | ICD-10-CM

## 2023-09-08 NOTE — Progress Notes (Signed)
 Location:  [facility] Room Number: 101D Place of Service:  SNF (31)   CODE STATUS: DNR  No Known Allergies  Chief Complaint  Patient presents with   Medical Management of Chronic Issues        Increased intraocular pressure bilateral:  Dementia without behavioral disturbance:  Protein calorie malnutrition:     HPI:  She is a 78 y.o. long term resident of this facility being seen for the management of her chronic illnesses: Increased intraocular pressure bilateral:  Dementia without behavioral disturbance:  Protein calorie malnutrition. There are no reports of uncontrolled pain. Her weight remains stable. There are no reports anxiety or agitation present.    Past Medical History:  Diagnosis Date   CAD (coronary artery disease)    DM (diabetes mellitus)  with CKD    GFR 30   Glaucoma    HTN (hypertension)    Obesity    Osteoarthritis     Past Surgical History:  Procedure Laterality Date   CHOLECYSTECTOMY     CORONARY STENT PLACEMENT     x3   REFRACTIVE SURGERY     detached retina   TUBAL LIGATION     78 yrs old    Social History   Socioeconomic History   Marital status: Divorced    Spouse name: Not on file   Number of children: 2   Years of education: Not on file   Highest education level: Not on file  Occupational History   Occupation: retired  Tobacco Use   Smoking status: Former    Current packs/day: 1.00    Types: Cigarettes   Smokeless tobacco: Not on file   Tobacco comments:    She quit smoking on day of admission to [facility] .  Vaping Use   Vaping status: Not on file  Substance and Sexual Activity   Alcohol use: Yes    Comment: rarely   Drug use: Never   Sexual activity: Not on file  Other Topics Concern   Not on file  Social History Narrative   Not on file   Social Drivers of Health   Financial Resource Strain: Unknown (05/03/2021)   Received from [facility]   Overall Financial Resource Strain  (CARDIA)    Difficulty of Paying Living Expenses: Patient declined  Food Insecurity: Unknown (05/03/2021)   Received from [facility]   Hunger Vital Sign    Worried About Running Out of Food in the Last Year: Patient declined    Ran Out of Food in the Last Year: Patient declined  Transportation Needs: Unknown (05/03/2021)   Received from [facility]   PRAPARE - Transportation    Lack of Transportation (Medical): Patient declined    Lack of Transportation (Non-Medical): Patient declined  Physical Activity: Not on file  Stress: Not on file  Social Connections: Unknown (05/06/2022)   Received from [facility]   Social Network    Social Network: Not on file  Intimate Partner Violence: Unknown (05/06/2022)   Received from [facility]   HITS    Physically Hurt: Not on file    Insult or Talk Down To: Not on file    Threaten Physical Harm: Not on file    Scream or Curse: Not on file   Family History  Problem Relation Age of Onset   Stroke Father    Diabetes Sister    Hypertension Sister    Arthritis Other  Heart disease Other    Hypertension Other    Stroke Other    Kidney disease Other    Diabetes Other       VITAL SIGNS BP (!) 152/70   Pulse 62   Temp 98.1 F (36.7 C)   Resp 20   Ht 5' 5 (1.651 m)   Wt 210 lb 6.4 oz (95.4 kg)   SpO2 98%   BMI 35.01 kg/m   Outpatient Encounter Medications as of 09/08/2023  Medication Sig   acetaminophen (TYLENOL) 650 MG CR tablet Take 650 mg by mouth 3 (three) times daily.   Amino Acids-Protein Hydrolys (FEEDING SUPPLEMENT, PRO-STAT SUGAR FREE 64,) LIQD Take 30 mLs by mouth 2 (two) times daily.   aspirin 81 MG tablet Take 81 mg by mouth daily.   atorvastatin (LIPITOR) 20 MG tablet Take 20 mg by mouth daily.   calcium-vitamin D (OSCAL WITH D) 500-5 MG-MCG tablet Take 1 tablet by mouth daily.   clobetasol cream (TEMOVATE) 0.05 % Apply 1 Application topically 2 (two) times a week. Apply to  labia/vaginal area twice a week. Once A Day on Mon, Fri; AS NEEDED   Continuous Glucose Sensor (FREESTYLE LIBRE 2 SENSOR) MISC by Does not apply route daily.   dapagliflozin propanediol (FARXIGA) 5 MG TABS tablet Take 5 mg by mouth daily.   dorzolamide-timolol (COSOPT) 22.3-6.8 MG/ML ophthalmic solution Place 1 drop into both eyes daily.  Wait at least 5 minutes between multiple drops in same eye.   ferrous sulfate 325 (65 FE) MG tablet Take 325 mg by mouth as directed. On Monday and Thursday   furosemide (LASIX) 40 MG tablet Take 40 mg by mouth daily.   Insulin Glargine (BASAGLAR KWIKPEN) 100 UNIT/ML Inject 26 Units into the skin at bedtime.   insulin lispro (HUMALOG KWIKPEN) 100 UNIT/ML KwikPen Inject 11 Units into the skin with breakfast, with lunch, and with evening meal.   latanoprost (XALATAN) 0.005 % ophthalmic solution Place 1 drop into both eyes daily.   loratadine  (CLARITIN ) 10 MG tablet Take 1 tablet (10 mg total) by mouth daily as needed for itching or rhinitis.   NON FORMULARY Diet:Regular   potassium chloride SA (KLOR-CON M) 20 MEQ tablet Take 20 mEq by mouth daily.   PRESCRIPTION MEDICATION Endit Skin Protectant Ointment; topical Special Instructions: Apply to vaginal/perineal area bid. Twice A Day   zinc oxide (ENDIT) 20 % ointment Apply 1 Application topically 2 (two) times daily.   No facility-administered encounter medications on file as of 09/08/2023.     SIGNIFICANT DIAGNOSTIC EXAMS  PREVIOUS   09-11-22: hgb A1c 8.8 chol 124 ldl 56 trig 85 hdl 51 12-15-22: wbc 5.8; hgb 11.5; hct 36.6; mcv 94.1 plt 194; glucose 75; bun 53; creat 1.74; k+ 3.8; na++ 139; ca 8.5; gfr 29; hgb A1c 7.0; tsh 2.108 06-15-23: wbc 5.7; hgb 12.3; hct 39.2; mcv 97.4 plt 169; glucose 160; bun 51; creat 1.81; k+ 3.6; na++ 139; ca 9.0; gfr 28; protein 6.3 albumin 3.2; chol 121; ldl 54; trig 128; hdl 41; tsh 2.150; hgb A1c 8.4  06-29-23: urine micro-albumin 17.9 07-08-23: glucose 153; bun 45; creat 2.01; k+  3.7; na++ 139; ca 8.9; gfr 25  NO NEW LABS.   Review of Systems  Constitutional:  Negative for malaise/fatigue.  Respiratory:  Negative for cough and shortness of breath.   Cardiovascular:  Negative for chest pain, palpitations and leg swelling.  Gastrointestinal:  Negative for abdominal pain, constipation and heartburn.  Musculoskeletal:  Negative for back  pain, joint pain and myalgias.  Skin: Negative.   Neurological:  Negative for dizziness.  Psychiatric/Behavioral:  The patient is not nervous/anxious.    Physical Exam Constitutional:      General: She is not in acute distress.    Appearance: She is well-developed. She is obese. She is not diaphoretic.  Neck:     Thyroid : No thyromegaly.  Cardiovascular:     Rate and Rhythm: Normal rate and regular rhythm.     Pulses: Normal pulses.     Heart sounds: Normal heart sounds.  Pulmonary:     Effort: Pulmonary effort is normal. No respiratory distress.     Breath sounds: Normal breath sounds.  Abdominal:     General: Bowel sounds are normal. There is no distension.     Palpations: Abdomen is soft.     Tenderness: There is no abdominal tenderness.  Musculoskeletal:        General: Normal range of motion.     Cervical back: Neck supple.     Right lower leg: No edema.     Left lower leg: No edema.  Lymphadenopathy:     Cervical: No cervical adenopathy.  Skin:    General: Skin is warm and dry.  Neurological:     Mental Status: She is alert. Mental status is at baseline.  Psychiatric:        Mood and Affect: Mood normal.     ASSESSMENT/ PLAN:  TODAY  Increased intraocular pressure bilateral: will continue cosopt and xalatan to both eyes  2. Dementia without behavioral disturbance: weight is 210 pounds.   3. Protein calorie malnutrition: protein 6.3 albumin 3.2 will continue supplements as directed    PREVIOUS   4. Chronic depression: is off remeron due to history of falls  5. 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 Aortic arch atherosclerosis: (cxr 04-13-09) is on statin.  7. Post menopausal osteopenia: t score -2.561; will monitor   8.  Stage 3 chronic kidney disease due to type 2 diabetes mellitis bun 45; creat 2.05 gfr 25;   9. Type 2 diabetes mellitus with stage 3 chronic kidney disease with long term current use of insulin: hgb A1c 8.4; will continue farxiga 5 mg daily; basaglar 28 units nightly; will continue humalog to: 14 units with meals     10. Hyperlipidemia: associated with type 2 diabetes mellitus: ldl 54 will continue lipitor 20 mg daily   11. Anemia due to stage 3 chronic kidney disease: hgb 12.3 will continue iron twice weekly      Barnie Seip NP [facility]  call [phone]

## 2023-09-28 ENCOUNTER — Other Ambulatory Visit (HOSPITAL_COMMUNITY)
Admission: RE | Admit: 2023-09-28 | Discharge: 2023-09-28 | Disposition: A | Discharge status: Home / Self Care | Source: Skilled Nursing Facility | Attending: Adult Health | Admitting: Adult Health

## 2023-09-28 DIAGNOSIS — E119 Type 2 diabetes mellitus without complications: Secondary | ICD-10-CM | POA: Diagnosis not present

## 2023-09-28 LAB — HEMOGLOBIN A1C
Hgb A1c MFr Bld: 8 % — ABNORMAL HIGH (ref 4.8–5.6)
Mean Plasma Glucose: 182.9 mg/dL

## 2023-09-29 ENCOUNTER — Encounter: Payer: Self-pay | Admitting: Adult Health

## 2023-09-29 ENCOUNTER — Non-Acute Institutional Stay (SKILLED_NURSING_FACILITY): Payer: Self-pay | Admitting: Adult Health

## 2023-09-29 DIAGNOSIS — E1122 Type 2 diabetes mellitus with diabetic chronic kidney disease: Secondary | ICD-10-CM | POA: Diagnosis not present

## 2023-09-29 DIAGNOSIS — N1832 Chronic kidney disease, stage 3b: Secondary | ICD-10-CM | POA: Diagnosis not present

## 2023-09-29 DIAGNOSIS — Z794 Long term (current) use of insulin: Secondary | ICD-10-CM | POA: Diagnosis not present

## 2023-09-29 NOTE — Progress Notes (Unsigned)
 Location:  [facility] Room Number: North/101/D Place of Service:  SNF (31)   CODE STATUS: DNR  No Known Allergies  Chief Complaint  Patient presents with   Results    follow up labs    HPI:  Her A1c is 8.0.  She is presently taking farxiga; humalog; lantus to manage her diabetes. Her cbg readings remain in the upper 100's through 200's. There are no reports of excessive thirst or urination. Her appetite is good and her weight remains stable.   Past Medical History:  Diagnosis Date   CAD (coronary artery disease)    DM (diabetes mellitus)  with CKD    GFR 30   Glaucoma    HTN (hypertension)    Obesity    Osteoarthritis     Past Surgical History:  Procedure Laterality Date   CHOLECYSTECTOMY     CORONARY STENT PLACEMENT     x3   REFRACTIVE SURGERY     detached retina   TUBAL LIGATION     78 yrs old    Social History   Socioeconomic History   Marital status: Divorced    Spouse name: Not on file   Number of children: 2   Years of education: Not on file   Highest education level: Not on file  Occupational History   Occupation: retired  Tobacco Use   Smoking status: Former    Current packs/day: 1.00    Types: Cigarettes   Smokeless tobacco: Not on file   Tobacco comments:    She quit smoking on day of admission to [facility] .  Vaping Use   Vaping status: Not on file  Substance and Sexual Activity   Alcohol use: Yes    Comment: rarely   Drug use: Never   Sexual activity: Not on file  Other Topics Concern   Not on file  Social History Narrative   Not on file   Social Drivers of Health   Financial Resource Strain: Unknown (05/03/2021)   Received from [facility]   Overall Financial Resource Strain (CARDIA)    Difficulty of Paying Living Expenses: Patient declined  Food Insecurity: Unknown (05/03/2021)   Received from [facility]   Hunger Vital Sign    Within the past 12 months, you worried that  your food would run out before you got the money to buy more.: Patient declined    Within the past 12 months, the food you bought just didn't last and you didn't have money to get more.: Patient declined  Transportation Needs: Unknown (05/03/2021)   Received from [facility]   PRAPARE - Transportation    Lack of Transportation (Medical): Patient declined    Lack of Transportation (Non-Medical): Patient declined  Physical Activity: Not on file  Stress: Not on file  Social Connections: Unknown (05/06/2022)   Received from [facility]   Social Network    Social Network: Not on file  Intimate Partner Violence: Unknown (05/06/2022)   Received from [facility]   HITS    Physically Hurt: Not on file    Insult or Talk Down To: Not on file    Threaten Physical Harm: Not on file    Scream or Curse: Not on file   Family History  Problem Relation Age of Onset   Stroke Father    Diabetes Sister    Hypertension Sister    Arthritis Other    Heart disease Other  Hypertension Other    Stroke Other    Kidney disease Other    Diabetes Other       VITAL SIGNS BP (!) 151/62   Pulse 68   Temp (!) 96.8 F (36 C)   Resp 20   Ht 5' 5 (1.651 m)   Wt 213 lb 6.4 oz (96.8 kg)   SpO2 98%   BMI 35.51 kg/m   Outpatient Encounter Medications as of 09/29/2023  Medication Sig   acetaminophen (TYLENOL) 650 MG CR tablet Take 650 mg by mouth 3 (three) times daily.   Amino Acids-Protein Hydrolys (FEEDING SUPPLEMENT, PRO-STAT SUGAR FREE 64,) LIQD Take 30 mLs by mouth 2 (two) times daily.   aspirin 81 MG tablet Take 81 mg by mouth daily.   atorvastatin (LIPITOR) 20 MG tablet Take 20 mg by mouth daily.   calcium-vitamin D (OSCAL WITH D) 500-5 MG-MCG tablet Take 1 tablet by mouth daily.   clobetasol cream (TEMOVATE) 0.05 % Apply 1 Application topically 2 (two) times a week. Apply to labia/vaginal area twice a week. Once A Day on Mon, Fri; AS NEEDED   Continuous Glucose Sensor  (FREESTYLE LIBRE 2 SENSOR) MISC by Does not apply route daily.   dapagliflozin propanediol (FARXIGA) 5 MG TABS tablet Take 5 mg by mouth daily.   dorzolamide-timolol (COSOPT) 22.3-6.8 MG/ML ophthalmic solution Place 1 drop into both eyes daily.  Wait at least 5 minutes between multiple drops in same eye.   EMBECTA AUTOSHIELD DUO 30G X 5 MM MISC    ferrous sulfate 325 (65 FE) MG tablet Take 325 mg by mouth as directed. On Monday and Thursday   furosemide (LASIX) 40 MG tablet Take 40 mg by mouth daily.   Insulin Glargine (BASAGLAR KWIKPEN) 100 UNIT/ML Inject 26 Units into the skin at bedtime.   insulin lispro (HUMALOG KWIKPEN) 100 UNIT/ML KwikPen Inject 11 Units into the skin with breakfast, with lunch, and with evening meal.   latanoprost (XALATAN) 0.005 % ophthalmic solution Place 1 drop into both eyes daily.   loratadine  (CLARITIN ) 10 MG tablet Take 1 tablet (10 mg total) by mouth daily as needed for itching or rhinitis.   NON FORMULARY Diet:Regular   potassium chloride SA (KLOR-CON M) 20 MEQ tablet Take 20 mEq by mouth daily.   PRESCRIPTION MEDICATION Endit Skin Protectant Ointment; topical Special Instructions: Apply to vaginal/perineal area bid. Twice A Day   zinc oxide (ENDIT) 20 % ointment Apply 1 Application topically 2 (two) times daily.   No facility-administered encounter medications on file as of 09/29/2023.     SIGNIFICANT DIAGNOSTIC EXAMS  PREVIOUS   09-11-22: hgb A1c 8.8 chol 124 ldl 56 trig 85 hdl 51 12-15-22: wbc 5.8; hgb 11.5; hct 36.6; mcv 94.1 plt 194; glucose 75; bun 53; creat 1.74; k+ 3.8; na++ 139; ca 8.5; gfr 29; hgb A1c 7.0; tsh 2.108 06-15-23: wbc 5.7; hgb 12.3; hct 39.2; mcv 97.4 plt 169; glucose 160; bun 51; creat 1.81; k+ 3.6; na++ 139; ca 9.0; gfr 28; protein 6.3 albumin 3.2; chol 121; ldl 54; trig 128; hdl 41; tsh 2.150; hgb A1c 8.4  06-29-23: urine micro-albumin 17.9 07-08-23: glucose 153; bun 45; creat 2.01; k+ 3.7; na++ 139; ca 8.9; gfr 25  TODAY  09-28-23:  hgb A1c 8.0    Review of Systems  Constitutional:  Negative for malaise/fatigue.  Respiratory:  Negative for cough and shortness of breath.   Cardiovascular:  Negative for chest pain, palpitations and leg swelling.  Gastrointestinal:  Negative for abdominal  pain, constipation and heartburn.  Musculoskeletal:  Negative for back pain, joint pain and myalgias.  Skin: Negative.   Neurological:  Negative for dizziness.  Psychiatric/Behavioral:  The patient is not nervous/anxious.    Physical Exam Constitutional:      General: She is not in acute distress.    Appearance: She is well-developed. She is obese. She is not diaphoretic.  Neck:     Thyroid : No thyromegaly.  Cardiovascular:     Rate and Rhythm: Normal rate and regular rhythm.     Pulses: Normal pulses.     Heart sounds: Normal heart sounds.  Pulmonary:     Effort: Pulmonary effort is normal. No respiratory distress.     Breath sounds: Normal breath sounds.  Abdominal:     General: Bowel sounds are normal. There is no distension.     Palpations: Abdomen is soft.     Tenderness: There is no abdominal tenderness.  Musculoskeletal:        General: Normal range of motion.     Cervical back: Neck supple.     Right lower leg: No edema.     Left lower leg: No edema.  Lymphadenopathy:     Cervical: No cervical adenopathy.  Skin:    General: Skin is warm and dry.  Neurological:     Mental Status: She is alert. Mental status is at baseline.  Psychiatric:        Mood and Affect: Mood normal.      ASSESSMENT/ PLAN:  TODAY  Type 2 diabetes mellitus with stage 3b chronic kidney disease with long term current use of insulin: hgb A1c is 8.0 down from 8.4.  will continue farxiga 5 mg (highest dose based upon renal function; will continue humalog 11 units with meals and will increase lantus to 29 units nightly    Barnie Seip NP [facility]   call [phone]

## 2023-10-15 ENCOUNTER — Encounter: Payer: Self-pay | Admitting: Internal Medicine

## 2023-10-15 ENCOUNTER — Non-Acute Institutional Stay (SKILLED_NURSING_FACILITY): Payer: Self-pay | Admitting: Internal Medicine

## 2023-10-15 DIAGNOSIS — F0390 Unspecified dementia without behavioral disturbance: Secondary | ICD-10-CM

## 2023-10-15 DIAGNOSIS — E1122 Type 2 diabetes mellitus with diabetic chronic kidney disease: Secondary | ICD-10-CM | POA: Diagnosis not present

## 2023-10-15 DIAGNOSIS — I10 Essential (primary) hypertension: Secondary | ICD-10-CM

## 2023-10-15 DIAGNOSIS — Z794 Long term (current) use of insulin: Secondary | ICD-10-CM

## 2023-10-15 DIAGNOSIS — R600 Localized edema: Secondary | ICD-10-CM | POA: Diagnosis not present

## 2023-10-15 DIAGNOSIS — N1832 Chronic kidney disease, stage 3b: Secondary | ICD-10-CM

## 2023-10-15 NOTE — Assessment & Plan Note (Addendum)
 Severe CKD has progressed slightly but remains in stage IV. Med list reviewed; no indication for change in dosages or meds. A1c is 8% which is the least acceptable A1c goal; patient has improved from prior values of 8.4%.  A1c will be updated and insulin adjusted appropriately.

## 2023-10-15 NOTE — Assessment & Plan Note (Signed)
 Over the last several months the systolic blood pressures have tended to remain in the 140+ range.  Low-dose carvedilol will be initiated and titrated based on blood pressure and peripheral edema response.

## 2023-10-15 NOTE — Assessment & Plan Note (Addendum)
 Significant peripheral edema persists despite compression hose.  She is not on amlodipine.  Low-dose carvedilol will be added because of the edema and the consistent systolic blood pressure in the 140s.  It can be titrated based on response of blood pressure and edema.

## 2023-10-15 NOTE — Assessment & Plan Note (Deleted)
 Severely CKD has progressed slightly but remains in stage IV and GFR 25.  Med list reviewed; no indication for change in dosages or meds.

## 2023-10-15 NOTE — Patient Instructions (Signed)
 See assessment and plan under each diagnosis in the problem list and acutely for this visit
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
## 2023-11-06 ENCOUNTER — Non-Acute Institutional Stay (SKILLED_NURSING_FACILITY): Payer: Self-pay | Admitting: Adult Health

## 2023-11-06 ENCOUNTER — Encounter: Payer: Self-pay | Admitting: Adult Health

## 2023-11-06 DIAGNOSIS — I129 Hypertensive chronic kidney disease with stage 1 through stage 4 chronic kidney disease, or unspecified chronic kidney disease: Secondary | ICD-10-CM

## 2023-11-06 DIAGNOSIS — E1122 Type 2 diabetes mellitus with diabetic chronic kidney disease: Secondary | ICD-10-CM

## 2023-11-06 DIAGNOSIS — I700 Atherosclerosis of aorta: Secondary | ICD-10-CM

## 2023-11-06 DIAGNOSIS — F0390 Unspecified dementia without behavioral disturbance: Secondary | ICD-10-CM

## 2023-11-06 DIAGNOSIS — N1831 Chronic kidney disease, stage 3a: Secondary | ICD-10-CM

## 2023-11-06 NOTE — Progress Notes (Signed)
 Location:  [facility] Room Number: 57 D Place of Service:  SNF (31)   CODE STATUS: DNR  No Known Allergies  Chief Complaint  Patient presents with   Care Plan Meeting     HPI:  We have come together for her care plan meeting. Family present. BIMS 6/15 mood 6/30: decreased energy, nervous at times. She uses wheelchair without falls. She requires dependent assist with her adl care. She is incontinent of bladder and bowel. Dietary: feeds self; regular diet appetite 76-100%; weight is 211.4 pounds and is stable. Therapy: none at this time. Activities: is active. 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 mellitus    Vascular dementia without behavioral disturbance  Past Medical History:  Diagnosis Date   CAD (coronary artery disease)    DM (diabetes mellitus)  with CKD    GFR 30   Glaucoma    HTN (hypertension)    Obesity    Osteoarthritis     Past Surgical History:  Procedure Laterality Date   CHOLECYSTECTOMY     CORONARY STENT PLACEMENT     x3   REFRACTIVE SURGERY     detached retina   TUBAL LIGATION     78 yrs old    Social History   Socioeconomic History   Marital status: Divorced    Spouse name: Not on file   Number of children: 2   Years of education: Not on file   Highest education level: Not on file  Occupational History   Occupation: retired  Tobacco Use   Smoking status: Former    Current packs/day: 1.00    Types: Cigarettes   Smokeless tobacco: Not on file   Tobacco comments:    She quit smoking on day of admission to [facility] .  Vaping Use   Vaping status: Not on file  Substance and Sexual Activity   Alcohol use: Not Currently    Comment: rarely   Drug use: Never   Sexual activity: Not on file  Other Topics Concern   Not on file  Social History Narrative   Not on file    Social Drivers of Health   Financial Resource Strain: Unknown (05/03/2021)   Received from [facility] [place]   Overall Financial Resource Strain (CARDIA)    Difficulty of Paying Living Expenses: Patient declined  Food Insecurity: Unknown (05/03/2021)   Received from [facility] [place]   Hunger Vital Sign    Within the past 12 months, you worried that your food would run out before you got the money to buy more.: Patient declined    Within the past 12 months, the food you bought just didn't last and you didn't have money to get more.: Patient declined  Transportation Needs: Unknown (05/03/2021)   Received from [facility] [place]   PRAPARE - Transportation    Lack of Transportation (Medical): Patient declined    Lack of Transportation (Non-Medical): Patient declined  Physical Activity: Not on file  Stress: Not on file  Social Connections: Unknown (05/06/2022)   Received from [facility]   Social Network    Social Network: Not on file  Intimate Partner Violence: Unknown (05/06/2022)   Received from [facility]   HITS    Physically Hurt: Not on file    Insult or Talk Down To: Not  on file    Threaten Physical Harm: Not on file    Scream or Curse: Not on file   Family History  Problem Relation Age of Onset   Stroke Father    Diabetes Sister    Hypertension Sister    Arthritis Other    Heart disease Other    Hypertension Other    Stroke Other    Kidney disease Other    Diabetes Other       VITAL SIGNS BP 128/80   Pulse 78   Temp 97.8 F (36.6 C)   Resp 18   Ht 5' 5 (1.651 m)   Wt 211 lb 6.4 oz (95.9 kg)   SpO2 97%   BMI 35.18 kg/m   Outpatient Encounter Medications as of 11/06/2023  Medication Sig   acetaminophen (TYLENOL) 650 MG CR tablet Take 650 mg by mouth 3 (three) times daily.   Amino Acids-Protein Hydrolys (FEEDING SUPPLEMENT, PRO-STAT SUGAR FREE 64,) LIQD Take 30 mLs by mouth daily.   aspirin 81 MG tablet Take 81 mg by mouth  daily.   atorvastatin (LIPITOR) 20 MG tablet Take 20 mg by mouth daily.   calcium-vitamin D (OSCAL WITH D) 500-5 MG-MCG tablet Take 1 tablet by mouth daily.   carvedilol (COREG) 6.25 MG tablet Take 6.25 mg by mouth 2 (two) times daily.   clobetasol cream (TEMOVATE) 0.05 % Apply 1 Application topically 2 (two) times a week. Apply to labia/vaginal area twice a week. Once A Day on Mon, Fri; AS NEEDED   Continuous Glucose Sensor (FREESTYLE LIBRE 2 SENSOR) MISC by Does not apply route daily.   dapagliflozin propanediol (FARXIGA) 5 MG TABS tablet Take 5 mg by mouth daily.   dorzolamide-timolol (COSOPT) 22.3-6.8 MG/ML ophthalmic solution Place 1 drop into both eyes daily.  Wait at least 5 minutes between multiple drops in same eye.   EMBECTA AUTOSHIELD DUO 30G X 5 MM MISC    ferrous sulfate 325 (65 FE) MG tablet Take 325 mg by mouth as directed. On Monday and Thursday   furosemide (LASIX) 40 MG tablet Take 40 mg by mouth daily.   insulin glargine (LANTUS) 100 UNIT/ML injection Inject 29 Units into the skin at bedtime.   insulin lispro (HUMALOG KWIKPEN) 100 UNIT/ML KwikPen Inject 11 Units into the skin with breakfast, with lunch, and with evening meal.   latanoprost (XALATAN) 0.005 % ophthalmic solution Place 1 drop into both eyes daily.   loratadine  (CLARITIN ) 10 MG tablet Take 10 mg by mouth daily.   NON FORMULARY Diet:Regular   potassium chloride SA (KLOR-CON M) 20 MEQ tablet Take 20 mEq by mouth daily.   zinc oxide (ENDIT) 20 % ointment Apply 1 Application topically 2 (two) times daily.   [DISCONTINUED] loratadine  (CLARITIN ) 10 MG tablet Take 1 tablet (10 mg total) by mouth daily as needed for itching or rhinitis. (Patient not taking: Reported on 11/06/2023)   [DISCONTINUED] PRESCRIPTION MEDICATION Endit Skin Protectant Ointment; topical Special Instructions: Apply to vaginal/perineal area bid. Twice A Day   No facility-administered encounter medications on file as of 11/06/2023.      SIGNIFICANT DIAGNOSTIC EXAMS  PREVIOUS   12-15-22: wbc 5.8; hgb 11.5; hct 36.6; mcv 94.1 plt 194; glucose 75; bun 53; creat 1.74; k+ 3.8; na++ 139; ca 8.5; gfr 29; hgb A1c 7.0; tsh 2.108 06-15-23: wbc 5.7; hgb 12.3; hct 39.2; mcv 97.4 plt 169; glucose 160; bun 51; creat 1.81; k+ 3.6; na++ 139; ca 9.0; gfr 28; protein 6.3 albumin 3.2; chol 121;  ldl 54; trig 128; hdl 41; tsh 2.150; hgb A1c 8.4  06-29-23: urine micro-albumin 17.9 07-08-23: glucose 153; bun 45; creat 2.01; k+ 3.7; na++ 139; ca 8.9; gfr 25 09-28-23: hgb A1c 8.0  NO NEW LABS.     Review of Systems  Constitutional:  Negative for malaise/fatigue.  Respiratory:  Negative for cough and shortness of breath.   Cardiovascular:  Negative for chest pain, palpitations and leg swelling.  Gastrointestinal:  Negative for abdominal pain, constipation and heartburn.  Musculoskeletal:  Negative for back pain, joint pain and myalgias.  Skin: Negative.   Neurological:  Negative for dizziness.  Psychiatric/Behavioral:  The patient is not nervous/anxious.     Physical Exam Constitutional:      General: She is not in acute distress.    Appearance: She is well-developed. She is obese. She is not diaphoretic.  Neck:     Thyroid : No thyromegaly.  Cardiovascular:     Rate and Rhythm: Normal rate and regular rhythm.     Heart sounds: Normal heart sounds.  Pulmonary:     Effort: Pulmonary effort is normal. No respiratory distress.     Breath sounds: Normal breath sounds.  Abdominal:     General: Bowel sounds are normal. There is no distension.     Palpations: Abdomen is soft.     Tenderness: There is no abdominal tenderness.  Musculoskeletal:        General: Normal range of motion.     Cervical back: Neck supple.     Right lower leg: No edema.     Left lower leg: No edema.  Lymphadenopathy:     Cervical: No cervical adenopathy.  Skin:    General: Skin is warm and dry.  Neurological:     Mental Status: She is alert. Mental status is  at baseline.  Psychiatric:        Mood and Affect: Mood normal.     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   Time spent with patient: 40 minutes: medications; dietary; activities.    Barnie Seip NP [facility]  call [phone]

## 2023-11-10 ENCOUNTER — Encounter: Payer: Self-pay | Admitting: Adult Health

## 2023-11-10 NOTE — Progress Notes (Unsigned)
 Location:  [facility]   Place of Service:      CODE STATUS: DNR  No Known Allergies  Chief Complaint  Patient presents with  . Medical Management of Chronic Issues    HPI:    Past Medical History:  Diagnosis Date  . CAD (coronary artery disease)   . DM (diabetes mellitus)  with CKD    GFR 30  . Glaucoma   . HTN (hypertension)   . Obesity   . Osteoarthritis     Past Surgical History:  Procedure Laterality Date  . CHOLECYSTECTOMY    . CORONARY STENT PLACEMENT     x3  . REFRACTIVE SURGERY     detached retina  . TUBAL LIGATION     78 yrs old    Social History   Socioeconomic History  . Marital status: Divorced    Spouse name: Not on file  . Number of children: 2  . Years of education: Not on file  . Highest education level: Not on file  Occupational History  . Occupation: retired  Tobacco Use  . Smoking status: Former    Current packs/day: 1.00    Types: Cigarettes  . Smokeless tobacco: Not on file  . Tobacco comments:    She quit smoking on day of admission to [facility] .  Vaping Use  . Vaping status: Not on file  Substance and Sexual Activity  . Alcohol use: Not Currently    Comment: rarely  . Drug use: Never  . Sexual activity: Not on file  Other Topics Concern  . Not on file  Social History Narrative  . Not on file   Social Drivers of Health   Financial Resource Strain: Unknown (05/03/2021)   Received from [facility] [place]   Overall Financial Resource Strain (CARDIA)   . Difficulty of Paying Living Expenses: Patient declined  Food Insecurity: Unknown (05/03/2021)   Received from [facility] [place]   Hunger Vital Sign   . Within the past 12 months, you worried that your food would run out before you got the money to buy more.: Patient declined   . Within the past 12 months, the food you bought just didn't last and you didn't have money to get more.: Patient declined  Transportation Needs: Unknown (05/03/2021)    Received from [facility] [place]   PRAPARE - Transportation   . Lack of Transportation (Medical): Patient declined   . Lack of Transportation (Non-Medical): Patient declined  Physical Activity: Not on file  Stress: Not on file  Social Connections: Unknown (05/06/2022)   Received from [facility]   Social Network   . Social Network: Not on file  Intimate Partner Violence: Unknown (05/06/2022)   Received from [facility]   HITS   . Physically Hurt: Not on file   . Insult or Talk Down To: Not on file   . Threaten Physical Harm: Not on file   . Scream or Curse: Not on file   Family History  Problem Relation Age of Onset  . Stroke Father   . Diabetes Sister   . Hypertension Sister   . Arthritis Other   . Heart disease Other   . Hypertension Other   . Stroke Other   . Kidney disease Other   . Diabetes Other       VITAL SIGNS BP (!) 140/68   Pulse 76   Temp 98.6 F (37 C)   Resp (!) 22  Ht 5' 5 (1.651 m)   Wt 211 lb 6.4 oz (95.9 kg)   BMI 35.18 kg/m   Outpatient Encounter Medications as of 11/10/2023  Medication Sig  . acetaminophen (TYLENOL) 650 MG CR tablet Take 650 mg by mouth 3 (three) times daily.  SABRA aspirin 81 MG tablet Take 81 mg by mouth daily.  SABRA atorvastatin (LIPITOR) 20 MG tablet Take 20 mg by mouth daily.  . calcium-vitamin D (OSCAL WITH D) 500-5 MG-MCG tablet Take 1 tablet by mouth daily.  . carvedilol (COREG) 6.25 MG tablet Take 6.25 mg by mouth 2 (two) times daily.  . clobetasol cream (TEMOVATE) 0.05 % Apply 1 Application topically 2 (two) times a week. Apply to labia/vaginal area twice a week. Once A Day on Mon, Fri; AS NEEDED  . Continuous Glucose Sensor (FREESTYLE LIBRE 2 SENSOR) MISC by Does not apply route daily.  . dapagliflozin propanediol (FARXIGA) 5 MG TABS tablet Take 5 mg by mouth daily.  . dorzolamide-timolol (COSOPT) 22.3-6.8 MG/ML ophthalmic solution Place 1 drop into both eyes daily.  Wait at least 5 minutes between multiple  drops in same eye.  . EMBECTA AUTOSHIELD DUO 30G X 5 MM MISC   . ferrous sulfate 325 (65 FE) MG tablet Take 325 mg by mouth as directed. On Monday and Thursday  . furosemide (LASIX) 40 MG tablet Take 40 mg by mouth daily.  . insulin glargine (LANTUS) 100 UNIT/ML injection Inject 29 Units into the skin at bedtime.  . insulin lispro (HUMALOG KWIKPEN) 100 UNIT/ML KwikPen Inject 11 Units into the skin with breakfast, with lunch, and with evening meal.  . latanoprost (XALATAN) 0.005 % ophthalmic solution Place 1 drop into both eyes daily.  . loratadine  (CLARITIN ) 10 MG tablet Take 10 mg by mouth daily.  . NON FORMULARY Diet:Regular  . potassium chloride SA (KLOR-CON M) 20 MEQ tablet Take 20 mEq by mouth daily.  . protein supplement (PROSOURCE NO CARB) LIQD 60 mLs daily.  SABRA zinc oxide (ENDIT) 20 % ointment Apply 1 Application topically 2 (two) times daily.  . Amino Acids-Protein Hydrolys (FEEDING SUPPLEMENT, PRO-STAT SUGAR FREE 64,) LIQD Take 30 mLs by mouth daily. (Patient not taking: Reported on 11/10/2023)   No facility-administered encounter medications on file as of 11/10/2023.     SIGNIFICANT DIAGNOSTIC EXAMS       ASSESSMENT/ PLAN:     Barnie Seip NP [facility]  Contact [phone] Monday through Friday 8am- 5pm  After hours call [phone]   This encounter was created in error - please disregard.

## 2023-12-10 ENCOUNTER — Non-Acute Institutional Stay (SKILLED_NURSING_FACILITY): Payer: Self-pay | Admitting: Adult Health

## 2023-12-10 ENCOUNTER — Encounter: Payer: Self-pay | Admitting: Adult Health

## 2023-12-10 DIAGNOSIS — I700 Atherosclerosis of aorta: Secondary | ICD-10-CM

## 2023-12-10 DIAGNOSIS — F339 Major depressive disorder, recurrent, unspecified: Secondary | ICD-10-CM

## 2023-12-10 DIAGNOSIS — E1122 Type 2 diabetes mellitus with diabetic chronic kidney disease: Secondary | ICD-10-CM | POA: Diagnosis not present

## 2023-12-10 DIAGNOSIS — I129 Hypertensive chronic kidney disease with stage 1 through stage 4 chronic kidney disease, or unspecified chronic kidney disease: Secondary | ICD-10-CM

## 2023-12-10 DIAGNOSIS — N1831 Chronic kidney disease, stage 3a: Secondary | ICD-10-CM

## 2023-12-10 NOTE — Progress Notes (Signed)
 Location:  [facility] Room Number: 101-D Place of Service:  SNF (31) Provider: Barnie Seip. 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 Her itching has not changed. There are no reports of uncontrolled pain. Her blood pressure readings are all normal.    Past Medical History:  Diagnosis Date   CAD (coronary artery disease)    DM (diabetes mellitus)  with CKD    GFR 30   Glaucoma    HTN (hypertension)    Obesity    Osteoarthritis     Past Surgical History:  Procedure Laterality Date   CHOLECYSTECTOMY     CORONARY STENT PLACEMENT     x3   REFRACTIVE SURGERY     detached retina   TUBAL LIGATION     78 yrs old    Social History   Socioeconomic History   Marital status: Divorced    Spouse name: Not on file   Number of children: 2   Years of education: Not on file   Highest education level: Not on file  Occupational History   Occupation: retired  Tobacco Use   Smoking status: Former    Current packs/day: 1.00    Types: Cigarettes   Smokeless tobacco: Not on file   Tobacco comments:    She quit smoking on day of admission to [facility] .  Vaping Use   Vaping status: Not on file  Substance and Sexual Activity   Alcohol use: Not Currently    Comment: rarely   Drug use: Never   Sexual activity: Not on file  Other Topics Concern   Not on file  Social History Narrative   Not on file   Social Drivers of Health   Financial Resource Strain: Unknown  (05/03/2021)   Received from [facility] [place]   Overall Financial Resource Strain (CARDIA)    Difficulty of Paying Living Expenses: Patient declined  Food Insecurity: Unknown (05/03/2021)   Received from [facility] [place]   Hunger Vital Sign    Within the past 12 months, you worried that your food would run out before you got the money to buy more.: Patient declined    Within the past 12 months, the food you bought just didn't last and you didn't have money to get more.: Patient declined  Transportation Needs: Unknown (05/03/2021)   Received from [facility] [place]   PRAPARE - Transportation    Lack of Transportation (Medical): Patient declined    Lack of Transportation (Non-Medical): Patient declined  Physical Activity: Not on file  Stress: Not on file  Social Connections: Unknown (05/06/2022)   Received from [facility]   Social Network    Social Network: Not on file  Intimate Partner Violence: Unknown (05/06/2022)   Received from [facility]   HITS    Physically Hurt: Not on file    Insult or Talk Down To: Not on file  Threaten Physical Harm: Not on file    Scream or Curse: Not on file   Family History  Problem Relation Age of Onset   Stroke Father    Diabetes Sister    Hypertension Sister    Arthritis Other    Heart disease Other    Hypertension Other    Stroke Other    Kidney disease Other    Diabetes Other       VITAL SIGNS BP 126/60   Pulse 68   Temp 97.7 F (36.5 C)   Resp 20   Ht 5' 5 (1.651 m)   Wt 213 lb 8 oz (96.8 kg)   SpO2 97%   BMI 35.53 kg/m   Outpatient Encounter Medications as of 12/10/2023  Medication Sig   acetaminophen (TYLENOL) 650 MG CR tablet Take 650 mg by mouth 3 (three) times daily.   Amino Acids-Protein Hydrolys (FEEDING SUPPLEMENT, PRO-STAT SUGAR FREE 64,) LIQD Take 60 mLs by mouth daily.   aspirin 81 MG tablet Take 81 mg by mouth daily.   atorvastatin (LIPITOR) 20 MG tablet Take 20 mg by mouth  daily.   calcium-vitamin D (OSCAL WITH D) 500-5 MG-MCG tablet Take 1 tablet by mouth daily.   carvedilol (COREG) 6.25 MG tablet Take 6.25 mg by mouth 2 (two) times daily.   clobetasol cream (TEMOVATE) 0.05 % Apply 1 Application topically 2 (two) times a week. Apply to labia/vaginal area twice a week. Once A Day on Mon, Fri; AS NEEDED   Continuous Glucose Sensor (FREESTYLE LIBRE 2 SENSOR) MISC by Does not apply route daily.   dapagliflozin propanediol (FARXIGA) 5 MG TABS tablet Take 5 mg by mouth daily.   dorzolamide-timolol (COSOPT) 22.3-6.8 MG/ML ophthalmic solution Place 1 drop into both eyes daily.  Wait at least 5 minutes between multiple drops in same eye.   EMBECTA AUTOSHIELD DUO 30G X 5 MM MISC    ferrous sulfate 325 (65 FE) MG tablet Take 325 mg by mouth as directed. On Monday and Thursday   furosemide (LASIX) 40 MG tablet Take 40 mg by mouth daily.   insulin glargine (LANTUS) 100 UNIT/ML injection Inject 29 Units into the skin at bedtime.   insulin lispro (HUMALOG KWIKPEN) 100 UNIT/ML KwikPen Inject 11 Units into the skin with breakfast, with lunch, and with evening meal.   latanoprost (XALATAN) 0.005 % ophthalmic solution Place 1 drop into both eyes daily.   loratadine  (CLARITIN ) 10 MG tablet Take 10 mg by mouth daily.   NON FORMULARY Diet:Regular   potassium chloride SA (KLOR-CON M) 20 MEQ tablet Take 20 mEq by mouth daily.   protein supplement (PROSOURCE NO CARB) LIQD 60 mLs daily.   zinc oxide (ENDIT) 20 % ointment Apply 1 Application topically 2 (two) times daily. (Patient not taking: Reported on 12/10/2023)   No facility-administered encounter medications on file as of 12/10/2023.     SIGNIFICANT DIAGNOSTIC EXAMS  PREVIOUS   12-15-22: wbc 5.8; hgb 11.5; hct 36.6; mcv 94.1 plt 194; glucose 75; bun 53; creat 1.74; k+ 3.8; na++ 139; ca 8.5; gfr 29; hgb A1c 7.0; tsh 2.108 06-15-23: wbc 5.7; hgb 12.3; hct 39.2; mcv 97.4 plt 169; glucose 160; bun 51; creat 1.81; k+ 3.6; na++ 139; ca  9.0; gfr 28; protein 6.3 albumin 3.2; chol 121; ldl 54; trig 128; hdl 41; tsh 2.150; hgb A1c 8.4  06-29-23: urine micro-albumin 17.9 07-08-23: glucose 153; bun 45; creat 2.01; k+ 3.7; na++ 139; ca 8.9; gfr 25  TODAY  09-28-23: hgb A1c  8.0    Review of Systems  Constitutional:  Negative for malaise/fatigue.  Respiratory:  Negative for cough and shortness of breath.   Cardiovascular:  Negative for chest pain, palpitations and leg swelling.  Gastrointestinal:  Negative for abdominal pain, constipation and heartburn.  Musculoskeletal:  Negative for back pain, joint pain and myalgias.  Skin: Negative.   Neurological:  Negative for dizziness.  Psychiatric/Behavioral:  The patient is not nervous/anxious.     Physical Exam Constitutional:      General: She is not in acute distress.    Appearance: She is well-developed. She is obese. She is not diaphoretic.  Neck:     Thyroid : No thyromegaly.  Cardiovascular:     Rate and Rhythm: Normal rate and regular rhythm.     Heart sounds: Normal heart sounds.  Pulmonary:     Effort: Pulmonary effort is normal. No respiratory distress.     Breath sounds: Normal breath sounds.  Abdominal:     General: Bowel sounds are normal. There is no distension.     Palpations: Abdomen is soft.     Tenderness: There is no abdominal tenderness.  Musculoskeletal:        General: Normal range of motion.     Cervical back: Neck supple.     Right lower leg: No edema.     Left lower leg: No edema.  Lymphadenopathy:     Cervical: No cervical adenopathy.  Skin:    General: Skin is warm and dry.  Neurological:     Mental Status: She is alert. Mental status is at baseline.  Psychiatric:        Mood and Affect: Mood normal.       ASSESSMENT/ PLAN:  TODAY  Chronic depression: is off remeron; due to her history of falls. She is emotionally stable.   2. 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   3. Aortic arch atherosclerosis (cxr 04-13-09); is on statin.    PREVIOUS   4. Post menopausal osteopenia: t score -2.561; will monitor   5.  Stage 3 chronic kidney disease due to type 2 diabetes mellitis bun 45; creat 2.05 gfr 25;   6. Type 2 diabetes mellitus with stage 3 chronic kidney disease with long term current use of insulin: hgb A1c 8.4; will continue farxiga 5 mg daily; basaglar 28 units nightly; will continue humalog to: 14 units with meals     7. Hyperlipidemia: associated with type 2 diabetes mellitus: ldl 54 will continue lipitor 20 mg daily   8. Anemia due to stage 3 chronic kidney disease: hgb 12.3 will continue iron twice weekly   9. Increased intraocular pressure bilateral: will continue cosopt and xalatan to both eyes  10. Dementia without behavioral disturbance: weight is 213 pounds.   11. Protein calorie malnutrition: protein 6.3 albumin 3.2 will continue supplements as directed    Barnie Seip NP [facility]   call [phone]

## 2024-01-13 ENCOUNTER — Encounter: Payer: Self-pay | Admitting: Internal Medicine

## 2024-01-13 ENCOUNTER — Non-Acute Institutional Stay: Payer: Self-pay | Admitting: Internal Medicine

## 2024-01-13 DIAGNOSIS — N184 Chronic kidney disease, stage 4 (severe): Secondary | ICD-10-CM | POA: Diagnosis not present

## 2024-01-13 DIAGNOSIS — E1122 Type 2 diabetes mellitus with diabetic chronic kidney disease: Secondary | ICD-10-CM

## 2024-01-13 DIAGNOSIS — I10 Essential (primary) hypertension: Secondary | ICD-10-CM | POA: Diagnosis not present

## 2024-01-13 DIAGNOSIS — F0390 Unspecified dementia without behavioral disturbance: Secondary | ICD-10-CM | POA: Diagnosis not present

## 2024-01-13 NOTE — Assessment & Plan Note (Signed)
 A1c & BMET need update. Most recent labs reveal A1c of 8% with GFR of 25, indicating Stage 4. Med list rviewed ; no change , but recheck labs.

## 2024-01-13 NOTE — Assessment & Plan Note (Signed)
 Current CKD Stage 4 with eGFR of 25. Current A1c 8%, which is adequate control with her advanced age & co-morbidities

## 2024-01-13 NOTE — Patient Instructions (Signed)
 See assessment and plan under each diagnosis in the problem list and acutely for this visit

## 2024-01-13 NOTE — Assessment & Plan Note (Addendum)
 She is pleasantly demented; she tends to confabulate..  No behavioral issues reported.  No change in psychotropic regimen indicated.

## 2024-01-13 NOTE — Progress Notes (Unsigned)
 NURSING HOME LOCATION:  [facility] ROOM NUMBER:  [id number]  CODE STATUS:  DNR  PCP: Landy Barnie RAMAN, NP   This is a nursing facility follow up visit of chronic medical diagnoses to document compliance with Regulation 483.30 (c) in The Long Term Care Survey Manual Phase 2 which mandates caregiver visit ( visits can alternate among physician, PA or NP as per statutes) within 10 days of 30 days / 60 days/ 90 days post admission to SNF date  .  Interim medical record and care since last SNF visit was updated with review of diagnostic studies and change in clinical status since last visit were documented.  HPI: She is a permanent resident of the facility with medical diagnoses of essential hypertension; glaucoma; CAD; vascular dementia; and diabetes with CKD.  A1c was 8% on 09/28/23. The past 10 day glucose average was 170 with range from 79 to 293.CKD is Stage 4 with most recent GFR of 25.  Review of systems: Dementia invalidated responses. As is always is the case, when seen, she states that she is fine. When I asked if she were having any medical symptoms ; she stated ask them,they know.  When asked about depression ,she states I really want to go home but at that point she laughed.  Constitutional: No fever, significant weight change, fatigue  Eyes: No redness, discharge, pain, vision change ENT/mouth: No nasal congestion,  purulent discharge, earache, change in hearing, sore throat  Cardiovascular: No chest pain, palpitations, paroxysmal nocturnal dyspnea, claudication, edema  Respiratory: No cough, sputum production, hemoptysis, DOE, significant snoring, apnea   Gastrointestinal: No heartburn, dysphagia, abdominal pain, nausea /vomiting, rectal bleeding, melena, change in bowels Genitourinary: No dysuria, hematuria, pyuria, incontinence, nocturia Musculoskeletal: No joint stiffness, joint swelling, weakness, pain Dermatologic: No rash, pruritus, change in appearance  of skin Neurologic: No dizziness, headache, syncope, seizures, numbness, tingling Psychiatric: No significant anxiety,insomnia, anorexia Endocrine: No change in hair/skin/nails, excessive thirst, excessive hunger, excessive urination  Hematologic/lymphatic: No significant bruising, lymphadenopathy, abnormal bleeding Allergy /immunology: No itchy/watery eyes, significant sneezing, urticaria, angioedema  Physical exam:  Pertinent or positive findings:Lids are puffy. She is completely edentulous. Slight accentuation of S1 & 2.Grade 1/2 systolic murmur present @ LUSB.1/2 + edema @ sock line & 1+ @ feet. Pedal pulses not palpable.She confabulates as noted.  General appearance: Adequately nourished; no acute distress, increased work of breathing is present.   Lymphatic: No lymphadenopathy about the head, neck, axilla. Eyes: No conjunctival inflammation or lid edema is present. There is no scleral icterus. Ears:  External ear exam shows no significant lesions or deformities.   Nose:  External nasal examination shows no deformity or inflammation. Nasal mucosa are pink and moist without lesions, exudates Neck:  No thyromegaly, masses, tenderness noted.    Heart:  Normal rate and regular rhythm without gallop, click, rub .  Lungs: Chest clear to auscultation without wheezes, rhonchi, rales, rubs. Abdomen: Bowel sounds are normal. Abdomen is soft and nontender with no organomegaly, hernias, masses. GU: Deferred  Extremities:  No cyanosis, clubbing Neurologic exam :Balance, Rhomberg, finger to nose testing could not be completed due to clinical state Skin: Warm & dry w/o tenting. No significant lesions or rash.  See summary under each active problem in the Problem List with associated updated therapeutic plan:  Dementia without behavioral disturbance [facility]) She is pleasantly demented; she tends to confabulate. No behavioral issues reported.  No change in psychotropic regimen indicated.  Essential  hypertension BP controlled; no change  in antihypertensive medications   CKD stage 4 due to type 2 diabetes mellitus (HCC) Current CKD Stage 4 with eGFR of 25. Current A1c 8%, which is adequate control with her advanced age & co-morbidities

## 2024-01-13 NOTE — Assessment & Plan Note (Signed)
 BP controlled; no change in antihypertensive medications

## 2024-02-03 ENCOUNTER — Non-Acute Institutional Stay (SKILLED_NURSING_FACILITY): Payer: Self-pay | Admitting: Adult Health

## 2024-02-03 ENCOUNTER — Encounter: Payer: Self-pay | Admitting: Adult Health

## 2024-02-03 DIAGNOSIS — I129 Hypertensive chronic kidney disease with stage 1 through stage 4 chronic kidney disease, or unspecified chronic kidney disease: Secondary | ICD-10-CM

## 2024-02-03 DIAGNOSIS — F0390 Unspecified dementia without behavioral disturbance: Secondary | ICD-10-CM

## 2024-02-03 DIAGNOSIS — N1831 Chronic kidney disease, stage 3a: Secondary | ICD-10-CM

## 2024-02-03 DIAGNOSIS — E1122 Type 2 diabetes mellitus with diabetic chronic kidney disease: Secondary | ICD-10-CM | POA: Diagnosis not present

## 2024-02-03 DIAGNOSIS — Z794 Long term (current) use of insulin: Secondary | ICD-10-CM

## 2024-02-03 DIAGNOSIS — R600 Localized edema: Secondary | ICD-10-CM

## 2024-02-03 NOTE — Progress Notes (Signed)
 Location:  [facility] Room Number: North/101/D Place of Service:  SNF (31)   CODE STATUS: DNR  No Known Allergies  Chief Complaint  Patient presents with   Care Plan Meeting     HPI:  We have come together for her care plan meeting. BIMS 7/15 mood 2/30: nervous at times. Uses wheelchair without falls. She requires dependent assist with her adl care. She is incontinent of bladder and bowel. Dietary: feeds self, regular diet: appetite 76-200% weight is 213.8 pounds. Therapy: none at times. Activities: does participate. She will continue to be followed for her chronic illnesses including: Hypertension associated with stage 3 chronic kidney disease due to type 2 diabetes mellitus  Dementia without behavioral disturbance  Bilateral lower extremity edema  Past Medical History:  Diagnosis Date   CAD (coronary artery disease)    DM (diabetes mellitus)  with CKD    GFR 30   Glaucoma    HTN (hypertension)    Obesity    Osteoarthritis     Past Surgical History:  Procedure Laterality Date   CHOLECYSTECTOMY     CORONARY STENT PLACEMENT     x3   REFRACTIVE SURGERY     detached retina   TUBAL LIGATION     78 yrs old    Social History   Socioeconomic History   Marital status: Divorced    Spouse name: Not on file   Number of children: 2   Years of education: Not on file   Highest education level: Not on file  Occupational History   Occupation: retired  Tobacco Use   Smoking status: Former    Current packs/day: 1.00    Types: Cigarettes   Smokeless tobacco: Not on file   Tobacco comments:    She quit smoking on day of admission to [facility] .  Vaping Use   Vaping status: Not on file  Substance and Sexual Activity   Alcohol use: Not Currently    Comment: rarely   Drug use: Never   Sexual activity: Not on file  Other Topics Concern   Not on file  Social History Narrative   Not on file   Social Drivers of Health   Financial Resource Strain:  Unknown (05/03/2021)   Received from [facility] [place]   Overall Financial Resource Strain (CARDIA)    Difficulty of Paying Living Expenses: Patient declined  Food Insecurity: Unknown (05/03/2021)   Received from [facility] [place]   Hunger Vital Sign    Within the past 12 months, you worried that your food would run out before you got the money to buy more.: Patient declined    Within the past 12 months, the food you bought just didn't last and you didn't have money to get more.: Patient declined  Transportation Needs: Unknown (05/03/2021)   Received from [facility] [place]   PRAPARE - Transportation    Lack of Transportation (Medical): Patient declined    Lack of Transportation (Non-Medical): Patient declined  Physical Activity: Not on file  Stress: Not on file  Social Connections: Not on file  Intimate Partner Violence: Not on file   Family History  Problem Relation Age of Onset   Stroke Father    Diabetes Sister    Hypertension Sister    Arthritis Other    Heart disease Other    Hypertension Other    Stroke Other    Kidney disease Other    Diabetes Other  VITAL SIGNS BP (!) 120/45   Pulse 92   Temp (!) 97.4 F (36.3 C)   Resp (!) 21   Ht 5' 5 (1.651 m)   Wt 213 lb 12.8 oz (97 kg)   SpO2 95%   BMI 35.58 kg/m   Outpatient Encounter Medications as of 02/03/2024  Medication Sig   acetaminophen (TYLENOL) 650 MG CR tablet Take 650 mg by mouth 3 (three) times daily.   aspirin 81 MG tablet Take 81 mg by mouth daily.   atorvastatin (LIPITOR) 20 MG tablet Take 20 mg by mouth daily.   calcium-vitamin D (OSCAL WITH D) 500-5 MG-MCG tablet Take 1 tablet by mouth daily.   carvedilol (COREG) 6.25 MG tablet Take 6.25 mg by mouth 2 (two) times daily.   cetirizine (ZYRTEC) 5 MG tablet Take 5 mg by mouth daily.   Continuous Glucose Receiver (FREESTYLE LIBRE 2 READER) DEVI 1 each by Does not apply route.   Continuous Glucose Sensor (FREESTYLE LIBRE  2 SENSOR) MISC by Does not apply route daily.   dapagliflozin propanediol (FARXIGA) 5 MG TABS tablet Take 5 mg by mouth daily.   dorzolamide-timolol (COSOPT) 22.3-6.8 MG/ML ophthalmic solution Place 1 drop into both eyes daily.  Wait at least 5 minutes between multiple drops in same eye.   ferrous sulfate 325 (65 FE) MG tablet Take 325 mg by mouth as directed. On Monday and Thursday   furosemide (LASIX) 40 MG tablet Take 40 mg by mouth daily.   insulin glargine (LANTUS) 100 UNIT/ML injection Inject 29 Units into the skin at bedtime.   insulin lispro (HUMALOG KWIKPEN) 100 UNIT/ML KwikPen Inject 11 Units into the skin with breakfast, with lunch, and with evening meal.   Insulin Pen Needle (BD AUTOSHIELD DUO) 30G X 5 MM MISC by Does not apply route.   latanoprost (XALATAN) 0.005 % ophthalmic solution Place 1 drop into both eyes daily.   NON FORMULARY Diet:Regular   potassium chloride SA (KLOR-CON M) 20 MEQ tablet Take 20 mEq by mouth daily.   protein supplement (PROSOURCE NO CARB) LIQD 60 mLs daily.   zinc oxide (ENDIT) 20 % ointment Apply 1 Application topically 2 (two) times daily.   Amino Acids-Protein Hydrolys (FEEDING SUPPLEMENT, PRO-STAT SUGAR FREE 64,) LIQD Take 60 mLs by mouth daily. (Patient not taking: Reported on 02/03/2024)   clobetasol cream (TEMOVATE) 0.05 % Apply 1 Application topically 2 (two) times a week. Apply to labia/vaginal area twice a week. Once A Day on Mon, Fri; AS NEEDED (Patient not taking: Reported on 02/03/2024)   EMBECTA AUTOSHIELD DUO 30G X 5 MM MISC  (Patient not taking: Reported on 02/03/2024)   loratadine  (CLARITIN ) 10 MG tablet Take 10 mg by mouth daily. (Patient not taking: Reported on 02/03/2024)   No facility-administered encounter medications on file as of 02/03/2024.     SIGNIFICANT DIAGNOSTIC EXAMS  PREVIOUS   06-15-23: wbc 5.7; hgb 12.3; hct 39.2; mcv 97.4 plt 169; glucose 160; bun 51; creat 1.81; k+ 3.6; na++ 139; ca 9.0; gfr 28; protein 6.3 albumin  3.2; chol 121; ldl 54; trig 128; hdl 41; tsh 2.150; hgb A1c 8.4  06-29-23: urine micro-albumin 17.9 07-08-23: glucose 153; bun 45; creat 2.01; k+ 3.7; na++ 139; ca 8.9; gfr 25  TODAY  09-28-23: hgb A1c 8.0  Review of Systems  Constitutional:  Negative for malaise/fatigue.  Respiratory:  Negative for cough and shortness of breath.   Cardiovascular:  Negative for chest pain, palpitations and leg swelling.  Gastrointestinal:  Negative for  abdominal pain, constipation and heartburn.  Musculoskeletal:  Negative for back pain, joint pain and myalgias.  Skin: Negative.   Neurological:  Negative for dizziness.  Psychiatric/Behavioral:  The patient is not nervous/anxious.     Physical Exam Constitutional:      General: She is not in acute distress.    Appearance: She is well-developed. She is obese. She is not diaphoretic.  Neck:     Thyroid : No thyromegaly.  Cardiovascular:     Rate and Rhythm: Normal rate and regular rhythm.     Heart sounds: Normal heart sounds.  Pulmonary:     Effort: Pulmonary effort is normal. No respiratory distress.     Breath sounds: Normal breath sounds.  Abdominal:     General: Bowel sounds are normal. There is no distension.     Palpations: Abdomen is soft.     Tenderness: There is no abdominal tenderness.  Musculoskeletal:        General: Normal range of motion.     Cervical back: Neck supple.     Right lower leg: No edema.     Left lower leg: No edema.  Lymphadenopathy:     Cervical: No cervical adenopathy.  Skin:    General: Skin is warm and dry.  Neurological:     Mental Status: She is alert. Mental status is at baseline.  Psychiatric:        Mood and Affect: Mood normal.     ASSESSMENT/ PLAN:  TODAY  Hypertension associated with stage 3 chronic kidney disease due to type 2 diabetes mellitus Dementia without behavioral disturbance Bilateral lower extremity edema  Will continue current medications Will continue current plan of care Will  continue to monitor her status.   Time spent with patient: 40 minutes: medications; plan of care; activities.    Barnie Seip NP [facility]   call [phone]

## 2024-02-11 ENCOUNTER — Non-Acute Institutional Stay (SKILLED_NURSING_FACILITY): Payer: Self-pay | Admitting: Adult Health

## 2024-02-11 ENCOUNTER — Encounter: Payer: Self-pay | Admitting: Adult Health

## 2024-02-11 DIAGNOSIS — E1122 Type 2 diabetes mellitus with diabetic chronic kidney disease: Secondary | ICD-10-CM | POA: Diagnosis not present

## 2024-02-11 DIAGNOSIS — M810 Age-related osteoporosis without current pathological fracture: Secondary | ICD-10-CM

## 2024-02-11 DIAGNOSIS — N184 Chronic kidney disease, stage 4 (severe): Secondary | ICD-10-CM | POA: Diagnosis not present

## 2024-02-11 DIAGNOSIS — E66812 Obesity, class 2: Secondary | ICD-10-CM | POA: Insufficient documentation

## 2024-02-11 NOTE — Progress Notes (Signed)
 Location:  [facility] Room Number: 101 Place of Service:  SNF (31)   CODE STATUS: dnr   No Known Allergies  Chief Complaint  Patient presents with   Medical Management of Chronic Issues         Class 2 severe obesity with body mass index (BMI) 35-39.9: Post menopausal osteopenia:  Stage 3 chronic kidney disease due to type 2 diabetes mellitus    HPI:  She is a 78 y.o. long term resident of this facility being seen for the management of her chronic illnesses:  Class 2 severe obesity with body mass index (BMI) 35-39.9: Post menopausal osteopenia:  Stage 3 chronic kidney disease due to type 2 diabetes mellitus. There are no reports of uncontrolled pain. She continues to get out of bed daily; does continue to socialize with others. Her weight remains without change.    Past Medical History:  Diagnosis Date   CAD (coronary artery disease)    DM (diabetes mellitus)  with CKD    GFR 30   Glaucoma    HTN (hypertension)    Obesity    Osteoarthritis     Past Surgical History:  Procedure Laterality Date   CHOLECYSTECTOMY     CORONARY STENT PLACEMENT     x3   REFRACTIVE SURGERY     detached retina   TUBAL LIGATION     78 yrs old    Social History   Socioeconomic History   Marital status: Divorced    Spouse name: Not on file   Number of children: 2   Years of education: Not on file   Highest education level: Not on file  Occupational History   Occupation: retired  Tobacco Use   Smoking status: Former    Current packs/day: 1.00    Types: Cigarettes   Smokeless tobacco: Not on file   Tobacco comments:    She quit smoking on day of admission to [facility] .  Vaping Use   Vaping status: Not on file  Substance and Sexual Activity   Alcohol use: Not Currently    Comment: rarely   Drug use: Never   Sexual activity: Not on file  Other Topics Concern   Not on file  Social History Narrative   Not on file   Social Drivers of Health   Financial  Resource Strain: Unknown (05/03/2021)   Received from [facility] [place]   Overall Financial Resource Strain (CARDIA)    Difficulty of Paying Living Expenses: Patient declined  Food Insecurity: Unknown (05/03/2021)   Received from [facility] [place]   Hunger Vital Sign    Within the past 12 months, you worried that your food would run out before you got the money to buy more.: Patient declined    Within the past 12 months, the food you bought just didn't last and you didn't have money to get more.: Patient declined  Transportation Needs: Unknown (05/03/2021)   Received from [facility] [place]   PRAPARE - Transportation    Lack of Transportation (Medical): Patient declined    Lack of Transportation (Non-Medical): Patient declined  Physical Activity: Not on file  Stress: Not on file  Social Connections: Not on file  Intimate Partner Violence: Not on file   Family History  Problem Relation Age of Onset   Stroke Father    Diabetes Sister    Hypertension Sister    Arthritis Other    Heart disease Other  Hypertension Other    Stroke Other    Kidney disease Other    Diabetes Other       VITAL SIGNS BP 136/88   Pulse 76   Temp 97.6 F (36.4 C)   Resp 18   Ht 5' 5 (1.651 m)   Wt 213 lb (96.6 kg)   SpO2 95%   BMI 35.45 kg/m   Outpatient Encounter Medications as of 02/11/2024  Medication Sig   acetaminophen (TYLENOL) 650 MG CR tablet Take 650 mg by mouth 3 (three) times daily.   aspirin 81 MG tablet Take 81 mg by mouth daily.   atorvastatin (LIPITOR) 20 MG tablet Take 20 mg by mouth daily.   calcium-vitamin D (OSCAL WITH D) 500-5 MG-MCG tablet Take 1 tablet by mouth daily.   carvedilol (COREG) 6.25 MG tablet Take 6.25 mg by mouth 2 (two) times daily.   cetirizine (ZYRTEC) 5 MG tablet Take 5 mg by mouth daily.   Continuous Glucose Receiver (FREESTYLE LIBRE 2 READER) DEVI 1 each by Does not apply route.   dapagliflozin propanediol (FARXIGA) 5 MG  TABS tablet Take 5 mg by mouth daily.   dorzolamide-timolol (COSOPT) 22.3-6.8 MG/ML ophthalmic solution Place 1 drop into both eyes daily.  Wait at least 5 minutes between multiple drops in same eye.   ferrous sulfate 325 (65 FE) MG tablet Take 325 mg by mouth as directed. On Monday and Thursday   furosemide (LASIX) 40 MG tablet Take 40 mg by mouth daily.   insulin glargine (LANTUS) 100 UNIT/ML injection Inject 29 Units into the skin at bedtime.   insulin lispro (HUMALOG KWIKPEN) 100 UNIT/ML KwikPen Inject 11 Units into the skin with breakfast, with lunch, and with evening meal.   Insulin Pen Needle (BD AUTOSHIELD DUO) 30G X 5 MM MISC by Does not apply route.   latanoprost (XALATAN) 0.005 % ophthalmic solution Place 1 drop into both eyes daily.   NON FORMULARY Diet:Regular   potassium chloride SA (KLOR-CON M) 20 MEQ tablet Take 20 mEq by mouth daily.   zinc oxide (ENDIT) 20 % ointment Apply 1 Application topically 2 (two) times daily.   No facility-administered encounter medications on file as of 02/11/2024.     SIGNIFICANT DIAGNOSTIC EXAMS   PREVIOUS   06-15-23: wbc 5.7; hgb 12.3; hct 39.2; mcv 97.4 plt 169; glucose 160; bun 51; creat 1.81; k+ 3.6; na++ 139; ca 9.0; gfr 28; protein 6.3 albumin 3.2; chol 121; ldl 54; trig 128; hdl 41; tsh 2.150; hgb A1c 8.4  06-29-23: urine micro-albumin 17.9 07-08-23: glucose 153; bun 45; creat 2.01; k+ 3.7; na++ 139; ca 8.9; gfr 25 09-28-23: hgb A1c 8.0   NO NEW LABS.    Review of Systems  Constitutional:  Negative for malaise/fatigue.  Respiratory:  Negative for cough and shortness of breath.   Cardiovascular:  Negative for chest pain, palpitations and leg swelling.  Gastrointestinal:  Negative for abdominal pain, constipation and heartburn.  Musculoskeletal:  Negative for back pain, joint pain and myalgias.  Skin: Negative.   Neurological:  Negative for dizziness.  Psychiatric/Behavioral:  The patient is not nervous/anxious.     Physical  Exam Constitutional:      General: She is not in acute distress.    Appearance: She is well-developed. She is obese. She is not diaphoretic.  Neck:     Thyroid : No thyromegaly.  Cardiovascular:     Rate and Rhythm: Normal rate and regular rhythm.     Heart sounds: Normal heart sounds.  Pulmonary:     Effort: Pulmonary effort is normal. No respiratory distress.     Breath sounds: Normal breath sounds.  Abdominal:     General: Bowel sounds are normal. There is no distension.     Palpations: Abdomen is soft.     Tenderness: There is no abdominal tenderness.  Musculoskeletal:        General: Normal range of motion.     Cervical back: Neck supple.     Right lower leg: No edema.     Left lower leg: No edema.  Lymphadenopathy:     Cervical: No cervical adenopathy.  Skin:    General: Skin is warm and dry.  Neurological:     Mental Status: She is alert. Mental status is at baseline.  Psychiatric:        Mood and Affect: Mood normal.        ASSESSMENT/ PLAN:  TODAY  Class 2 severe obesity with body mass index (BMI) 35-39.9: has diabetes; ckd stage 3 hyperlipidemia  2. Post menopausal osteopenia: t score -2.561 will monitor  3. Stage 3 chronic kidney disease due to type 2 diabetes mellitus bun 45; creat 2.05 gfr 25    PREVIOUS   4. Type 2 diabetes mellitus with stage 3 chronic kidney disease with long term current use of insulin: hgb A1c 8.0; will continue farxiga 5 mg daily; basaglar 29 units nightly; will continue humalog to: 11 units with meals     5. Hyperlipidemia: associated with type 2 diabetes mellitus: ldl 54 will continue lipitor 20 mg daily   6. Anemia due to stage 3 chronic kidney disease: hgb 12.3 will continue iron twice weekly   7. Increased intraocular pressure bilateral: will continue cosopt and xalatan to both eyes  8. Dementia without behavioral disturbance: weight is 213 pounds.   9. Protein calorie malnutrition: protein 6.3 albumin 3.2 will continue  supplements as directed   10. Chronic depression: is off remeron; due to her history of falls. She is emotionally stable.   11. Hypertension associated with stage 3a chronic kidney disease due to type 2 diabetes mellitus: b/p 136/88 is stable will continue to monitor her status.   12. Aortic arch atherosclerosis (cxr 04-13-09); is on statin.   Will check cbc; cmp; lipids; hgb A1c    Barnie Seip NP [facility]   call [phone]

## 2024-02-15 ENCOUNTER — Other Ambulatory Visit (HOSPITAL_COMMUNITY)
Admission: RE | Admit: 2024-02-15 | Discharge: 2024-02-15 | Disposition: A | Source: Skilled Nursing Facility | Attending: Adult Health | Admitting: Adult Health

## 2024-02-15 DIAGNOSIS — E119 Type 2 diabetes mellitus without complications: Secondary | ICD-10-CM | POA: Diagnosis not present

## 2024-02-15 LAB — COMPREHENSIVE METABOLIC PANEL WITH GFR
ALT: 19 U/L (ref 0–44)
AST: 21 U/L (ref 15–41)
Albumin: 3.6 g/dL (ref 3.5–5.0)
Alkaline Phosphatase: 63 U/L (ref 38–126)
Anion gap: 8 (ref 5–15)
BUN: 53 mg/dL — ABNORMAL HIGH (ref 8–23)
CO2: 30 mmol/L (ref 22–32)
Calcium: 8.9 mg/dL (ref 8.9–10.3)
Chloride: 103 mmol/L (ref 98–111)
Creatinine, Ser: 1.82 mg/dL — ABNORMAL HIGH (ref 0.44–1.00)
GFR, Estimated: 28 mL/min — ABNORMAL LOW (ref >60)
Glucose, Bld: 165 mg/dL — ABNORMAL HIGH (ref 70–99)
Potassium: 4 mmol/L (ref 3.5–5.1)
Sodium: 142 mmol/L (ref 135–145)
Total Bilirubin: 0.4 mg/dL (ref 0.0–1.2)
Total Protein: 6.2 g/dL — ABNORMAL LOW (ref 6.5–8.1)

## 2024-02-15 LAB — CBC
HCT: 38.9 % (ref 36.0–46.0)
Hemoglobin: 12.7 g/dL (ref 12.0–15.0)
MCH: 32.2 pg (ref 26.0–34.0)
MCHC: 32.6 g/dL (ref 30.0–36.0)
MCV: 98.7 fL (ref 80.0–100.0)
Platelets: 162 K/uL (ref 150–400)
RBC: 3.94 MIL/uL (ref 3.87–5.11)
RDW: 14.4 % (ref 11.5–15.5)
WBC: 5.8 K/uL (ref 4.0–10.5)
nRBC: 0 % (ref 0.0–0.2)

## 2024-02-15 LAB — HEMOGLOBIN A1C
Hgb A1c MFr Bld: 7.8 % — ABNORMAL HIGH (ref 4.8–5.6)
Mean Plasma Glucose: 177.16 mg/dL

## 2024-02-15 LAB — LIPID PANEL
Cholesterol: 110 mg/dL (ref 0–200)
HDL: 39 mg/dL — ABNORMAL LOW (ref >40)
LDL Cholesterol: 45 mg/dL (ref 0–99)
Total CHOL/HDL Ratio: 2.8 ratio
Triglycerides: 130 mg/dL (ref <150)
VLDL: 26 mg/dL (ref 0–40)

## 2024-02-17 ENCOUNTER — Non-Acute Institutional Stay (SKILLED_NURSING_FACILITY): Admitting: Adult Health

## 2024-02-17 ENCOUNTER — Encounter: Payer: Self-pay | Admitting: Adult Health

## 2024-02-17 DIAGNOSIS — E1351 Other specified diabetes mellitus with diabetic peripheral angiopathy without gangrene: Secondary | ICD-10-CM

## 2024-02-17 NOTE — Progress Notes (Signed)
 Location:  [facility] Room Number: 101 D Place of Service:  SNF (31)   CODE STATUS: DNR  No Known Allergies  Chief Complaint  Patient presents with   Diabetes    HPI:  Her hgb A1c is 7.8; which is slightly improved from 8.0.  She is presently taking farxiga 5 mg daily lantus 29 units nightly and humalog 11 units with meals. She is tolerating her medications without difficulty. Her cbg range mainly in the upper 100's. She has had several normal cbg's in the AM.   Past Medical History:  Diagnosis Date   CAD (coronary artery disease)    DM (diabetes mellitus)  with CKD    GFR 30   Glaucoma    HTN (hypertension)    Obesity    Osteoarthritis     Past Surgical History:  Procedure Laterality Date   CHOLECYSTECTOMY     CORONARY STENT PLACEMENT     x3   REFRACTIVE SURGERY     detached retina   TUBAL LIGATION     78 yrs old    Social History   Socioeconomic History   Marital status: Divorced    Spouse name: Not on file   Number of children: 2   Years of education: Not on file   Highest education level: Not on file  Occupational History   Occupation: retired  Tobacco Use   Smoking status: Former    Current packs/day: 1.00    Types: Cigarettes   Smokeless tobacco: Not on file   Tobacco comments:    She quit smoking on day of admission to [facility] .  Vaping Use   Vaping status: Not on file  Substance and Sexual Activity   Alcohol use: Not Currently    Comment: rarely   Drug use: Never   Sexual activity: Not on file  Other Topics Concern   Not on file  Social History Narrative   Not on file   Social Drivers of Health   Financial Resource Strain: Unknown (05/03/2021)   Received from [facility] [place]   Overall Financial Resource Strain (CARDIA)    Difficulty of Paying Living Expenses: Patient declined  Food Insecurity: Unknown (05/03/2021)   Received from [facility] [place]   Hunger Vital Sign    Within the past  12 months, you worried that your food would run out before you got the money to buy more.: Patient declined    Within the past 12 months, the food you bought just didn't last and you didn't have money to get more.: Patient declined  Transportation Needs: Unknown (05/03/2021)   Received from [facility] [place]   PRAPARE - Transportation    Lack of Transportation (Medical): Patient declined    Lack of Transportation (Non-Medical): Patient declined  Physical Activity: Not on file  Stress: Not on file  Social Connections: Not on file  Intimate Partner Violence: Not on file   Family History  Problem Relation Age of Onset   Stroke Father    Diabetes Sister    Hypertension Sister    Arthritis Other    Heart disease Other    Hypertension Other    Stroke Other    Kidney disease Other    Diabetes Other       VITAL SIGNS BP 130/62   Pulse (!) 59   Temp (!) 97.5 F (36.4 C)   Resp 18   Ht 5' 5 (1.651 m)  Wt 211 lb 6.4 oz (95.9 kg)   SpO2 93%   BMI 35.18 kg/m   Outpatient Encounter Medications as of 02/17/2024  Medication Sig   acetaminophen (TYLENOL) 650 MG CR tablet Take 650 mg by mouth 3 (three) times daily.   aspirin 81 MG tablet Take 81 mg by mouth daily.   atorvastatin (LIPITOR) 20 MG tablet Take 20 mg by mouth daily.   calcium-vitamin D (OSCAL WITH D) 500-5 MG-MCG tablet Take 1 tablet by mouth daily.   carvedilol (COREG) 6.25 MG tablet Take 6.25 mg by mouth 2 (two) times daily.   Continuous Glucose Receiver (FREESTYLE LIBRE 2 READER) DEVI 1 each by Does not apply route.   dapagliflozin propanediol (FARXIGA) 5 MG TABS tablet Take 5 mg by mouth daily.   dorzolamide-timolol (COSOPT) 22.3-6.8 MG/ML ophthalmic solution Place 1 drop into both eyes daily.  Wait at least 5 minutes between multiple drops in same eye.   ferrous sulfate 325 (65 FE) MG tablet Take 325 mg by mouth as directed. On Monday and Thursday   furosemide (LASIX) 40 MG tablet Take 40 mg by mouth daily.    hydrOXYzine (ATARAX) 10 MG tablet Take 10 mg by mouth 3 (three) times daily. 10mg ; amt: 1; oral Special Instructions: If resident becomes too sedated inform NP to reduce dosage Three Times A Day 08:00 AM, 01:00 PM, 08:00 PM   insulin glargine (LANTUS) 100 UNIT/ML injection Inject 29 Units into the skin at bedtime.   insulin lispro (HUMALOG KWIKPEN) 100 UNIT/ML KwikPen Inject 11 Units into the skin with breakfast, with lunch, and with evening meal.   Insulin Pen Needle (BD AUTOSHIELD DUO) 30G X 5 MM MISC by Does not apply route.   latanoprost (XALATAN) 0.005 % ophthalmic solution Place 1 drop into both eyes daily.   NON FORMULARY Diet:Regular   potassium chloride SA (KLOR-CON M) 20 MEQ tablet Take 20 mEq by mouth daily.   protein supplement (PROSOURCE NO CARB) LIQD 60 mLs. 15-60 gram-kcal/30 mL; amt: 60ml; oral Once A Day 08:00 AM   zinc oxide (ENDIT) 20 % ointment Apply 1 Application topically 2 (two) times daily.   cetirizine (ZYRTEC) 5 MG tablet Take 5 mg by mouth daily. (Patient not taking: Reported on 02/17/2024)   No facility-administered encounter medications on file as of 02/17/2024.     SIGNIFICANT DIAGNOSTIC EXAMS  PREVIOUS   06-15-23: wbc 5.7; hgb 12.3; hct 39.2; mcv 97.4 plt 169; glucose 160; bun 51; creat 1.81; k+ 3.6; na++ 139; ca 9.0; gfr 28; protein 6.3 albumin 3.2; chol 121; ldl 54; trig 128; hdl 41; tsh 2.150; hgb A1c 8.4  06-29-23: urine micro-albumin 17.9 07-08-23: glucose 153; bun 45; creat 2.01; k+ 3.7; na++ 139; ca 8.9; gfr 25 09-28-23: hgb A1c 8.0   TODAY  02-15-24: hgb A1c 7.8     Review of Systems  Constitutional:  Negative for malaise/fatigue.  Respiratory:  Negative for cough and shortness of breath.   Cardiovascular:  Negative for chest pain, palpitations and leg swelling.  Gastrointestinal:  Negative for abdominal pain, constipation and heartburn.  Musculoskeletal:  Negative for back pain, joint pain and myalgias.  Skin: Negative.   Neurological:   Negative for dizziness.  Psychiatric/Behavioral:  The patient is not nervous/anxious.     Physical Exam Constitutional:      General: She is not in acute distress.    Appearance: She is well-developed. She is obese. She is not diaphoretic.  Neck:     Thyroid : No thyromegaly.  Cardiovascular:  Rate and Rhythm: Normal rate and regular rhythm.     Heart sounds: Normal heart sounds.  Pulmonary:     Effort: Pulmonary effort is normal. No respiratory distress.     Breath sounds: Normal breath sounds.  Abdominal:     General: Bowel sounds are normal. There is no distension.     Palpations: Abdomen is soft.     Tenderness: There is no abdominal tenderness.  Musculoskeletal:        General: Normal range of motion.     Cervical back: Neck supple.     Right lower leg: No edema.     Left lower leg: No edema.  Lymphadenopathy:     Cervical: No cervical adenopathy.  Skin:    General: Skin is warm and dry.  Neurological:     Mental Status: She is alert. Mental status is at baseline.  Psychiatric:        Mood and Affect: Mood normal.       ASSESSMENT/ PLAN:  TODAY  DM (diabetes mellitus) secondary with peripheral vascular disease: will continue lantus 29 units nightly humalog 11 units with meals; will increase farxiga to 10 mg daily and will check bmp on 03-01-24.    Barnie Seip NP [facility]   call [phone]

## 2024-03-01 ENCOUNTER — Other Ambulatory Visit (HOSPITAL_COMMUNITY)
Admission: RE | Admit: 2024-03-01 | Discharge: 2024-03-01 | Disposition: A | Discharge status: Home / Self Care | Source: Skilled Nursing Facility | Attending: Adult Health | Admitting: Adult Health

## 2024-03-01 DIAGNOSIS — E119 Type 2 diabetes mellitus without complications: Secondary | ICD-10-CM | POA: Insufficient documentation

## 2024-03-01 LAB — BASIC METABOLIC PANEL WITH GFR
Anion gap: 13 (ref 5–15)
BUN: 44 mg/dL — ABNORMAL HIGH (ref 8–23)
CO2: 24 mmol/L (ref 22–32)
Calcium: 8.9 mg/dL (ref 8.9–10.3)
Chloride: 106 mmol/L (ref 98–111)
Creatinine, Ser: 1.8 mg/dL — ABNORMAL HIGH (ref 0.44–1.00)
GFR, Estimated: 28 mL/min — ABNORMAL LOW
Glucose, Bld: 136 mg/dL — ABNORMAL HIGH (ref 70–99)
Potassium: 4.1 mmol/L (ref 3.5–5.1)
Sodium: 142 mmol/L (ref 135–145)

## 2024-03-04 ENCOUNTER — Other Ambulatory Visit (HOSPITAL_COMMUNITY)
Admission: RE | Admit: 2024-03-04 | Discharge: 2024-03-04 | Disposition: A | Discharge status: Home / Self Care | Source: Skilled Nursing Facility | Attending: Internal Medicine | Admitting: Internal Medicine

## 2024-03-04 DIAGNOSIS — F03B0 Unspecified dementia, moderate, without behavioral disturbance, psychotic disturbance, mood disturbance, and anxiety: Secondary | ICD-10-CM | POA: Insufficient documentation

## 2024-03-04 DIAGNOSIS — Z20828 Contact with and (suspected) exposure to other viral communicable diseases: Secondary | ICD-10-CM | POA: Insufficient documentation

## 2024-03-04 DIAGNOSIS — Z1152 Encounter for screening for COVID-19: Secondary | ICD-10-CM | POA: Insufficient documentation

## 2024-03-04 DIAGNOSIS — J1000 Influenza due to other identified influenza virus with unspecified type of pneumonia: Secondary | ICD-10-CM | POA: Insufficient documentation

## 2024-03-04 LAB — RESP PANEL BY RT-PCR (RSV, FLU A&B, COVID)  RVPGX2
Influenza A by PCR: NEGATIVE
Influenza B by PCR: NEGATIVE
Resp Syncytial Virus by PCR: NEGATIVE
SARS Coronavirus 2 by RT PCR: NEGATIVE

## 2024-03-14 ENCOUNTER — Encounter: Payer: Self-pay | Admitting: Adult Health

## 2024-03-14 ENCOUNTER — Non-Acute Institutional Stay (SKILLED_NURSING_FACILITY): Payer: Self-pay | Admitting: Adult Health

## 2024-03-14 DIAGNOSIS — E1351 Other specified diabetes mellitus with diabetic peripheral angiopathy without gangrene: Secondary | ICD-10-CM

## 2024-03-14 DIAGNOSIS — E1169 Type 2 diabetes mellitus with other specified complication: Secondary | ICD-10-CM

## 2024-03-14 DIAGNOSIS — D631 Anemia in chronic kidney disease: Secondary | ICD-10-CM

## 2024-03-14 DIAGNOSIS — N1832 Chronic kidney disease, stage 3b: Secondary | ICD-10-CM | POA: Diagnosis not present

## 2024-03-14 NOTE — Progress Notes (Signed)
 " Location:  [facility] Room Number: 101 Place of Service:  SNF (31)   CODE STATUS: dnr   Allergies[1]  Chief Complaint  Patient presents with   Medical Management of Chronic Issues             Type 2 diabetes mellitus with stage 3 chronic kidney disease with long term current use of insulin:. Hyperlipidemia associated with type 2 diabetes mellitus: Anemia due to stage 3 chronic kidney disease    HPI:  She is a 79 y.o. 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 Hyperlipidemia associated with type 2 diabetes mellitus: Anemia due to stage 3 chronic kidney disease. There are no reports of uncontrolled pain. She was unable to tolerate atarax due to lethargy and was stopped.    Past Medical History:  Diagnosis Date   CAD (coronary artery disease)    DM (diabetes mellitus)  with CKD    GFR 30   Glaucoma    HTN (hypertension)    Obesity    Osteoarthritis     Past Surgical History:  Procedure Laterality Date   CHOLECYSTECTOMY     CORONARY STENT PLACEMENT     x3   REFRACTIVE SURGERY     detached retina   TUBAL LIGATION     79 yrs old    Social History   Socioeconomic History   Marital status: Divorced    Spouse name: Not on file   Number of children: 2   Years of education: Not on file   Highest education level: Not on file  Occupational History   Occupation: retired  Tobacco Use   Smoking status: Former    Current packs/day: 1.00    Types: Cigarettes   Smokeless tobacco: Not on file   Tobacco comments:    She quit smoking on day of admission to [facility] .  Vaping Use   Vaping status: Not on file  Substance and Sexual Activity   Alcohol use: Not Currently    Comment: rarely   Drug use: Never   Sexual activity: Not on file  Other Topics Concern   Not on file  Social History Narrative   Not  on file   Social Drivers of Health   Tobacco Use: Medium Risk (03/14/2024)   Patient History    Smoking Tobacco Use: Former    Smokeless Tobacco Use: Unknown    Passive Exposure: Not on file  Financial Resource Strain: Unknown (05/03/2021)   Received from [facility] [place]   Overall Financial Resource Strain (CARDIA)    Difficulty of Paying Living Expenses: Patient declined  Food Insecurity: Unknown (05/03/2021)   Received from [facility] [place]   Epic    Within the past 12 months, you worried that your food would run out before you got the money to buy more.: Patient declined    Within the past 12 months, the food you bought just didn't last and you didn't have money to get more.: Patient declined  Transportation Needs: Unknown (05/03/2021)   Received from [facility] [place]   PRAPARE - Transportation    Lack of Transportation (Medical): Patient declined    Lack of Transportation (Non-Medical): Patient declined  Physical Activity: Not on file  Stress: Not on file  Social Connections: Not on file  Intimate Partner Violence: Not  on file  Depression (PHQ2-9): Low Risk (06/25/2023)   Depression (PHQ2-9)    PHQ-2 Score: 0  Alcohol Screen: Not on file  Housing: Not on file  Utilities: Not on file  Health Literacy: Not on file   Family History  Problem Relation Age of Onset   Stroke Father    Diabetes Sister    Hypertension Sister    Arthritis Other    Heart disease Other    Hypertension Other    Stroke Other    Kidney disease Other    Diabetes Other       VITAL SIGNS BP (!) 125/59   Pulse (!) 52   Temp (!) 97.1 F (36.2 C)   Resp 20   Ht 5' 5 (1.651 m)   Wt 213 lb 8 oz (96.8 kg)   SpO2 92%   BMI 35.53 kg/m   Outpatient Encounter Medications as of 03/14/2024  Medication Sig   dapagliflozin propanediol (FARXIGA) 10 MG TABS tablet Take 10 mg by mouth daily.   acetaminophen (TYLENOL) 650 MG CR tablet Take 650 mg by mouth 3 (three) times  daily.   aspirin 81 MG tablet Take 81 mg by mouth daily.   atorvastatin (LIPITOR) 20 MG tablet Take 20 mg by mouth daily.   calcium-vitamin D (OSCAL WITH D) 500-5 MG-MCG tablet Take 1 tablet by mouth daily.   carvedilol (COREG) 6.25 MG tablet Take 6.25 mg by mouth 2 (two) times daily.   Continuous Glucose Receiver (FREESTYLE LIBRE 2 READER) DEVI 1 each by Does not apply route.   dorzolamide-timolol (COSOPT) 22.3-6.8 MG/ML ophthalmic solution Place 1 drop into both eyes daily.  Wait at least 5 minutes between multiple drops in same eye.   ferrous sulfate 325 (65 FE) MG tablet Take 325 mg by mouth as directed. On Monday and Thursday   furosemide (LASIX) 40 MG tablet Take 40 mg by mouth daily.   insulin glargine (LANTUS) 100 UNIT/ML injection Inject 29 Units into the skin at bedtime.   insulin lispro (HUMALOG KWIKPEN) 100 UNIT/ML KwikPen Inject 11 Units into the skin with breakfast, with lunch, and with evening meal.   Insulin Pen Needle (BD AUTOSHIELD DUO) 30G X 5 MM MISC by Does not apply route.   latanoprost (XALATAN) 0.005 % ophthalmic solution Place 1 drop into both eyes daily.   NON FORMULARY Diet:Regular   potassium chloride SA (KLOR-CON M) 20 MEQ tablet Take 20 mEq by mouth daily.   protein supplement (PROSOURCE NO CARB) LIQD 60 mLs. 15-60 gram-kcal/30 mL; amt: 60ml; oral Once A Day 08:00 AM   zinc oxide (ENDIT) 20 % ointment Apply 1 Application topically 2 (two) times daily.   [DISCONTINUED] cetirizine (ZYRTEC) 5 MG tablet Take 5 mg by mouth daily. (Patient not taking: Reported on 02/17/2024)   [DISCONTINUED] dapagliflozin propanediol (FARXIGA) 5 MG TABS tablet Take 5 mg by mouth daily.   [DISCONTINUED] hydrOXYzine (ATARAX) 10 MG tablet Take 10 mg by mouth 3 (three) times daily. 10mg ; amt: 1; oral Special Instructions: If resident becomes too sedated inform NP to reduce dosage Three Times A Day 08:00 AM, 01:00 PM, 08:00 PM   No facility-administered encounter medications on file as of  03/14/2024.     SIGNIFICANT DIAGNOSTIC EXAMS  PREVIOUS   06-15-23: wbc 5.7; hgb 12.3; hct 39.2; mcv 97.4 plt 169; glucose 160; bun 51; creat 1.81; k+ 3.6; na++ 139; ca 9.0; gfr 28; protein 6.3 albumin 3.2; chol 121; ldl 54; trig 128; hdl 41; tsh 2.150; hgb A1c  8.4  06-29-23: urine micro-albumin 17.9 07-08-23: glucose 153; bun 45; creat 2.01; k+ 3.7; na++ 139; ca 8.9; gfr 25 09-28-23: hgb A1c 8.0   TODAY  02-15-24: wbc 5.8; hgb 12.7; hct 38.9; mcv 98.7 plt 162; glucose 165; bun 53; creat 1.82; k+ 4.0; na++ 142; ca 8.9; gfr 28; protein 6.2 albumin 3.6 chol 110; ldl 45; trig 130 hdl 39  hgb A1c 7.8      Review of Systems  Constitutional:  Negative for malaise/fatigue.  Respiratory:  Negative for cough and shortness of breath.   Cardiovascular:  Negative for chest pain, palpitations and leg swelling.  Gastrointestinal:  Negative for abdominal pain, constipation and heartburn.  Musculoskeletal:  Negative for back pain, joint pain and myalgias.  Skin: Negative.   Neurological:  Negative for dizziness.  Psychiatric/Behavioral:  The patient is not nervous/anxious.     Physical Exam Constitutional:      General: She is not in acute distress.    Appearance: She is well-developed. She is obese. She is not diaphoretic.  Neck:     Thyroid : No thyromegaly.  Cardiovascular:     Rate and Rhythm: Normal rate and regular rhythm.     Heart sounds: Normal heart sounds.  Pulmonary:     Effort: Pulmonary effort is normal. No respiratory distress.     Breath sounds: Normal breath sounds.  Abdominal:     General: Bowel sounds are normal. There is no distension.     Palpations: Abdomen is soft.     Tenderness: There is no abdominal tenderness.  Musculoskeletal:        General: Normal range of motion.     Cervical back: Neck supple.     Right lower leg: No edema.     Left lower leg: No edema.  Lymphadenopathy:     Cervical: No cervical adenopathy.  Skin:    General: Skin is warm and dry.   Neurological:     Mental Status: She is alert. Mental status is at baseline.  Psychiatric:        Mood and Affect: Mood normal.     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  2. Hyperlipidemia associated with type 2 diabetes mellitus: ldl 45; will continue lipitor 20 mg daily   3. Anemia due to stage 3 chronic kidney disease: hgb 12.7; will continue iron twice weekly    PREVIOUS   4. Increased intraocular pressure bilateral: will continue cosopt and xalatan to both eyes  5. Dementia without behavioral disturbance: weight is 213 pounds.   6. Protein calorie malnutrition: protein 6.2 albumin 3.6 will continue supplements as directed   7. Chronic depression: is off remeron; due to her history of falls. She is emotionally stable.   8. Hypertension associated with stage 3a chronic kidney disease due to type 2 diabetes mellitus: b/p 125/59 is stable will continue to monitor her status.   9. Aortic arch atherosclerosis (cxr 04-13-09); is on statin.   10. Class 2 severe obesity with body mass index (BMI) 35-39.9: has diabetes; ckd stage 3 hyperlipidemia  11. Post menopausal osteopenia: t score -2.561 will monitor  12. Stage 3 chronic kidney disease due to type 2 diabetes mellitus bun 53; creat 1.82 gfr 28     Barnie Seip NP [facility]   call [phone]     [  1] No Known Allergies  "

## 2024-04-14 ENCOUNTER — Encounter: Payer: Self-pay | Admitting: Internal Medicine

## 2024-04-14 ENCOUNTER — Non-Acute Institutional Stay (SKILLED_NURSING_FACILITY): Payer: Self-pay | Admitting: Internal Medicine

## 2024-04-14 DIAGNOSIS — F0390 Unspecified dementia without behavioral disturbance: Secondary | ICD-10-CM

## 2024-04-14 DIAGNOSIS — E1122 Type 2 diabetes mellitus with diabetic chronic kidney disease: Secondary | ICD-10-CM

## 2024-04-14 DIAGNOSIS — R600 Localized edema: Secondary | ICD-10-CM

## 2024-04-14 DIAGNOSIS — N184 Chronic kidney disease, stage 4 (severe): Secondary | ICD-10-CM

## 2024-04-14 NOTE — Assessment & Plan Note (Signed)
 Difficult despite obesity; protein/caloric malnutrition suggested by a total protein 6.2.  Her albumin has improved from 3.2 up to a low normal of 3.6.  Nutritionist continues to monitor.

## 2024-04-14 NOTE — Assessment & Plan Note (Signed)
 She remains pleasantly demented without behavioral issues.  No change indicated.  Continue to monitor.

## 2024-04-14 NOTE — Assessment & Plan Note (Signed)
 She has CKD high stage IV which has been serially stable.  Current creatinine is 1.80 there is a component of prerenal azotemia with BUN of 44.  This is in the context of Farxiga which is going to cause diuresis.  Diabetic control is adequate based on a value of 7.8% in the context of advanced comorbidities and advanced age.  No change indicated; continue to monitor.

## 2024-04-14 NOTE — Progress Notes (Unsigned)
 "   NURSING HOME LOCATION:  [facility] ROOM NUMBER:  [id number]  CODE STATUS:  DNR  PCP: Landy Barnie RAMAN, NP   This is a nursing facility follow up visit  of chronic medical diagnoses to document compliance with Regulation 483.30 (c) in The Long Term Care Survey Manual Phase 2 which mandates caregiver visit ( visits can alternate among physician, PA or NP as per statutes) within 10 days of 30 days / 60 days/ 90 days post admission to SNF date  .  Interim medical record and care since last SNF visit was updated with review of diagnostic studies and change in clinical status since last visit were documented.  HPI: She is a permanent resident of this facility with medical diagnoses of CAD; glaucoma; essential hypertension; osteoarthritis; dyslipidemia; protein/caloric malnutrition; and diabetes with CKD. Labs are current as of last month.  She has CKD high stage IV which has been serially stable.  Creatinine is 1.80.  There is a prerenal component in the context of Farxiga as BUN is 44.  Diabetic control is good in the context of her multiple complex comorbidities with a value of 7.8%.  Surprisingly, despite the advanced CKD, CBC is normal.  Albumin has improved from 3.2 up to 3.6 but total protein minimally decreased from 6.3 to 6.2.  Review of systems: Dementia invalidated responses.  She does validate itching of the upper extremities.  She denies any other extrinsic symptoms.  She denies any cardiopulmonary symptoms.  When found she had been in the activities room where they were playing a game, picking the correct answer from multiple options for questions such as what disease do pirates get?  When asked what the activity was ,she statedwatching a movie.  She stated that she was anxious to go home and wanted to know where her car was.  She stated that she thought it was in the parking lot here.  She stated all I did was to come to see the doctor and now I need to go  home.  Constitutional: No fever, significant weight change, fatigue  Eyes: No redness, discharge, pain, vision change ENT/mouth: No nasal congestion,  purulent discharge, earache, change in hearing, sore throat  Cardiovascular: No chest pain, palpitations, paroxysmal nocturnal dyspnea, claudication, edema  Respiratory: No cough, sputum production, hemoptysis, DOE, significant snoring, apnea   Gastrointestinal: No heartburn, dysphagia, abdominal pain, nausea /vomiting, rectal bleeding, melena, change in bowels Genitourinary: No dysuria, hematuria, pyuria, incontinence, nocturia Musculoskeletal: No joint stiffness, joint swelling, weakness, pain Dermatologic: No rash, pruritus, change in appearance of skin Neurologic: No dizziness, headache, syncope, seizures, numbness, tingling Psychiatric: No significant anxiety, depression, insomnia, anorexia Endocrine: No change in hair/skin/nails, excessive thirst, excessive hunger, excessive urination  Hematologic/lymphatic: No significant bruising, lymphadenopathy, abnormal bleeding Allergy /immunology: No itchy/watery eyes, significant sneezing, urticaria, angioedema  Physical exam:  Pertinent or positive findings: She is pleasant and interactive but obviously demented.  Head is gray-white.  Eyelids are puffy.  Arcus senilis is present.  She is edentulous.  First heart sound is accentuated.  Breath sounds are decreased.  Abdomen is protuberant.  She has 1+ edema at the sock line and 1.5+ to 2+ edema over the dorsum of the feet.  She has scattered small eschar and abrasions over the upper extremities, greater on the right than the left.  There is greater fast ecchymosis and also vitiliginous scarring over the left forearm.  Pedal pulses are not palpable.  General appearance: Adequately nourished; no acute distress, increased  work of breathing is present.   Lymphatic: No lymphadenopathy about the head, neck, axilla. Eyes: No conjunctival inflammation or lid  edema is present. There is no scleral icterus. Ears:  External ear exam shows no significant lesions or deformities.   Nose:  External nasal examination shows no deformity or inflammation. Nasal mucosa are pink and moist without lesions, exudates Oral exam:  Lips and gums are healthy appearing. There is no oropharyngeal erythema or exudate. Neck:  No thyromegaly, masses, tenderness noted.    Heart:  No gallop, murmur, click, rub .  Lungs:  without wheezes, rhonchi, rales, rubs. Abdomen: Bowel sounds are normal. Abdomen is soft and nontender with no organomegaly, hernias, masses. GU: Deferred  Extremities:  No cyanosis, clubbing  Neurologic exam :Balance, Rhomberg, finger to nose testing could not be completed due to clinical state Skin: Warm & dry w/o tenting. No significant rash.  See summary under each active problem in the Problem List with associated updated therapeutic plan :  Dementia without behavioral disturbance [facility]) She remains pleasantly demented without behavioral issues.  No change indicated.  Continue to monitor.  CKD stage 4 due to type 2 diabetes mellitus (HCC) She has CKD high stage IV which has been serially stable.  Current creatinine is 1.80  & eGFR  28. There is a component of prerenal azotemia with BUN of 44.  This is in the context of Farxiga is associated with osmotic diuresis.  Diabetic control is adequate based on a value of 7.8% in the context of advanced comorbidities and advanced age.  No change indicated; continue to monitor.  Obesity, morbid (HCC) Despite obesity; protein/caloric malnutrition suggested by a total protein 6.2.  Her albumin has improved from 3.2 up to a low normal of 3.6.  Nutritionist continues to monitor.  Peripheral edema She denies any active cardiopulmonary symptoms; she is essentially unaware of the significant peripheral edema.  Dementia and validates her responses. Last BNP on record was less than 30; but this was in 2011.  There is no  ECHO on record. Farxiga will be continued along with diuretic; her present status is probably as good control of edema possible in view of her severe CKD.   "

## 2024-04-15 ENCOUNTER — Encounter: Payer: Self-pay | Admitting: Internal Medicine

## 2024-04-15 NOTE — Assessment & Plan Note (Signed)
 She denies any active cardiopulmonary symptoms; she is essentially unaware of the significant peripheral edema.  Dementia and validates her responses. Last BNP on record was less than 30; but this was in 2011.  There is no ECHO on record. Farxiga will be continued along with diuretic; her present status is probably as good control of edema possible in view of her severe CKD.
